# Patient Record
Sex: Male | Born: 1935 | Race: White | Hispanic: No | Marital: Married | Smoking: Former smoker
Health system: Southern US, Community
[De-identification: ages and names within clinical notes are randomized; demographics above are authoritative.]

## PROBLEM LIST (undated history)

## (undated) DIAGNOSIS — K4090 Unilateral inguinal hernia, without obstruction or gangrene, not specified as recurrent: Secondary | ICD-10-CM

## (undated) DIAGNOSIS — R296 Repeated falls: Secondary | ICD-10-CM

## (undated) DIAGNOSIS — Z87898 Personal history of other specified conditions: Secondary | ICD-10-CM

## (undated) DIAGNOSIS — I5189 Other ill-defined heart diseases: Secondary | ICD-10-CM

## (undated) DIAGNOSIS — I340 Nonrheumatic mitral (valve) insufficiency: Secondary | ICD-10-CM

## (undated) DIAGNOSIS — Z96649 Presence of unspecified artificial hip joint: Secondary | ICD-10-CM

## (undated) DIAGNOSIS — I219 Acute myocardial infarction, unspecified: Secondary | ICD-10-CM

## (undated) DIAGNOSIS — Z8679 Personal history of other diseases of the circulatory system: Secondary | ICD-10-CM

## (undated) DIAGNOSIS — I351 Nonrheumatic aortic (valve) insufficiency: Secondary | ICD-10-CM

## (undated) DIAGNOSIS — G20C Parkinsonism, unspecified: Secondary | ICD-10-CM

## (undated) DIAGNOSIS — M1990 Unspecified osteoarthritis, unspecified site: Secondary | ICD-10-CM

## (undated) DIAGNOSIS — G20 Parkinson's disease: Secondary | ICD-10-CM

## (undated) DIAGNOSIS — D693 Immune thrombocytopenic purpura: Secondary | ICD-10-CM

## (undated) DIAGNOSIS — L0291 Cutaneous abscess, unspecified: Secondary | ICD-10-CM

## (undated) DIAGNOSIS — E785 Hyperlipidemia, unspecified: Secondary | ICD-10-CM

## (undated) DIAGNOSIS — I872 Venous insufficiency (chronic) (peripheral): Secondary | ICD-10-CM

## (undated) DIAGNOSIS — I712 Thoracic aortic aneurysm, without rupture: Secondary | ICD-10-CM

## (undated) DIAGNOSIS — G912 (Idiopathic) normal pressure hydrocephalus: Secondary | ICD-10-CM

## (undated) DIAGNOSIS — G20A1 Parkinson's disease without dyskinesia, without mention of fluctuations: Secondary | ICD-10-CM

## (undated) DIAGNOSIS — I4510 Unspecified right bundle-branch block: Secondary | ICD-10-CM

## (undated) DIAGNOSIS — E0590 Thyrotoxicosis, unspecified without thyrotoxic crisis or storm: Secondary | ICD-10-CM

## (undated) DIAGNOSIS — I10 Essential (primary) hypertension: Secondary | ICD-10-CM

## (undated) DIAGNOSIS — I119 Hypertensive heart disease without heart failure: Secondary | ICD-10-CM

## (undated) DIAGNOSIS — I2510 Atherosclerotic heart disease of native coronary artery without angina pectoris: Secondary | ICD-10-CM

## (undated) DIAGNOSIS — Z9079 Acquired absence of other genital organ(s): Secondary | ICD-10-CM

## (undated) HISTORY — DX: Thoracic aortic aneurysm, without rupture: I71.2

## (undated) HISTORY — DX: Parkinson's disease: G20

## (undated) HISTORY — DX: Acquired absence of other genital organ(s): Z90.79

## (undated) HISTORY — DX: Atherosclerotic heart disease of native coronary artery without angina pectoris: I25.10

## (undated) HISTORY — DX: Cutaneous abscess, unspecified: L02.91

## (undated) HISTORY — DX: Unilateral inguinal hernia, without obstruction or gangrene, not specified as recurrent: K40.90

## (undated) HISTORY — DX: Essential (primary) hypertension: I10

## (undated) HISTORY — DX: Hyperlipidemia, unspecified: E78.5

## (undated) HISTORY — DX: Thyrotoxicosis, unspecified without thyrotoxic crisis or storm: E05.90

## (undated) HISTORY — PX: TONSILLECTOMY: SHX5217

## (undated) HISTORY — PX: KNEE SURGERY: SHX244

## (undated) HISTORY — DX: Parkinsonism, unspecified: G20.C

## (undated) HISTORY — PX: HERNIA REPAIR: SHX51

## (undated) HISTORY — DX: Unspecified osteoarthritis, unspecified site: M19.90

## (undated) HISTORY — DX: Presence of unspecified artificial hip joint: Z96.649

## (undated) HISTORY — PX: HIP FUSION: SHX669

## (undated) HISTORY — DX: Unspecified right bundle-branch block: I45.10

## (undated) HISTORY — PX: WRIST SURGERY: SHX841

## (undated) HISTORY — DX: Hypertensive heart disease without heart failure: I11.9

## (undated) HISTORY — DX: Parkinson's disease without dyskinesia, without mention of fluctuations: G20.A1

## (undated) HISTORY — DX: Acute myocardial infarction, unspecified: I21.9

## (undated) HISTORY — PX: CHOLECYSTECTOMY: SHX55

## (undated) HISTORY — PX: PROSTATE SURGERY: SHX751

## (undated) HISTORY — DX: Immune thrombocytopenic purpura: D69.3

---

## 2001-02-19 ENCOUNTER — Ambulatory Visit (HOSPITAL_COMMUNITY): Admission: RE | Admit: 2001-02-19 | Discharge: 2001-02-19 | Payer: Self-pay | Admitting: Gastroenterology

## 2002-08-01 ENCOUNTER — Emergency Department (HOSPITAL_COMMUNITY): Admission: EM | Admit: 2002-08-01 | Discharge: 2002-08-02 | Payer: Self-pay | Admitting: Emergency Medicine

## 2002-08-02 ENCOUNTER — Encounter: Payer: Self-pay | Admitting: Emergency Medicine

## 2005-11-13 ENCOUNTER — Encounter: Payer: Self-pay | Admitting: Orthopedic Surgery

## 2005-12-10 ENCOUNTER — Encounter: Payer: Self-pay | Admitting: Orthopedic Surgery

## 2006-01-10 ENCOUNTER — Encounter: Payer: Self-pay | Admitting: Orthopedic Surgery

## 2006-07-24 ENCOUNTER — Ambulatory Visit (HOSPITAL_COMMUNITY): Admission: RE | Admit: 2006-07-24 | Discharge: 2006-07-25 | Payer: Self-pay | Admitting: Ophthalmology

## 2009-04-26 ENCOUNTER — Emergency Department: Payer: Self-pay | Admitting: Emergency Medicine

## 2010-11-23 ENCOUNTER — Other Ambulatory Visit: Payer: Self-pay | Admitting: Urology

## 2010-11-23 ENCOUNTER — Other Ambulatory Visit (HOSPITAL_COMMUNITY): Payer: Self-pay | Admitting: Urology

## 2010-11-23 ENCOUNTER — Encounter (HOSPITAL_COMMUNITY): Payer: Medicare Other

## 2010-11-23 ENCOUNTER — Ambulatory Visit (HOSPITAL_COMMUNITY)
Admission: RE | Admit: 2010-11-23 | Discharge: 2010-11-23 | Disposition: A | Discharge status: Home / Self Care | Payer: Medicare Other | Source: Ambulatory Visit | Attending: Urology | Admitting: Urology

## 2010-11-23 DIAGNOSIS — Z01811 Encounter for preprocedural respiratory examination: Secondary | ICD-10-CM | POA: Insufficient documentation

## 2010-11-23 DIAGNOSIS — I517 Cardiomegaly: Secondary | ICD-10-CM | POA: Insufficient documentation

## 2010-11-23 DIAGNOSIS — N400 Enlarged prostate without lower urinary tract symptoms: Secondary | ICD-10-CM | POA: Insufficient documentation

## 2010-11-23 DIAGNOSIS — Z01812 Encounter for preprocedural laboratory examination: Secondary | ICD-10-CM | POA: Insufficient documentation

## 2010-11-23 LAB — BASIC METABOLIC PANEL
BUN: 18 mg/dL (ref 6–23)
CO2: 30 mEq/L (ref 19–32)
Calcium: 8.8 mg/dL (ref 8.4–10.5)
Chloride: 99 mEq/L (ref 96–112)
Creatinine, Ser: 0.87 mg/dL (ref 0.4–1.5)
GFR calc Af Amer: >60 mL/min (ref >60)
GFR calc non Af Amer: >60 mL/min (ref >60)
Glucose, Bld: 93 mg/dL (ref 70–99)
Potassium: 4 mEq/L (ref 3.5–5.1)
Sodium: 136 mEq/L (ref 135–145)

## 2010-11-23 LAB — CBC
HCT: 36.4 % — ABNORMAL LOW (ref 39.0–52.0)
Hemoglobin: 12.2 g/dL — ABNORMAL LOW (ref 13.0–17.0)
MCH: 32.5 pg (ref 26.0–34.0)
MCHC: 33.5 g/dL (ref 30.0–36.0)
MCV: 97.1 fL (ref 78.0–100.0)
Platelets: 186 10*3/uL (ref 150–400)
RBC: 3.75 MIL/uL — ABNORMAL LOW (ref 4.22–5.81)
RDW: 13.1 % (ref 11.5–15.5)
WBC: 4.7 10*3/uL (ref 4.0–10.5)

## 2010-11-23 LAB — SURGICAL PCR SCREEN
MRSA, PCR: NEGATIVE
Staphylococcus aureus: POSITIVE — AB

## 2010-12-03 ENCOUNTER — Inpatient Hospital Stay (HOSPITAL_COMMUNITY)
Admission: RE | Admit: 2010-12-03 | Discharge: 2010-12-05 | DRG: 714 | Disposition: A | Discharge status: Home / Self Care | Payer: Medicare Other | Source: Ambulatory Visit | Attending: Urology | Admitting: Urology

## 2010-12-03 ENCOUNTER — Other Ambulatory Visit: Payer: Self-pay | Admitting: Urology

## 2010-12-03 DIAGNOSIS — R351 Nocturia: Secondary | ICD-10-CM | POA: Diagnosis present

## 2010-12-03 DIAGNOSIS — R3915 Urgency of urination: Secondary | ICD-10-CM | POA: Diagnosis present

## 2010-12-03 DIAGNOSIS — R39198 Other difficulties with micturition: Secondary | ICD-10-CM | POA: Diagnosis present

## 2010-12-03 DIAGNOSIS — N401 Enlarged prostate with lower urinary tract symptoms: Principal | ICD-10-CM | POA: Diagnosis present

## 2010-12-03 DIAGNOSIS — I10 Essential (primary) hypertension: Secondary | ICD-10-CM | POA: Diagnosis present

## 2010-12-03 DIAGNOSIS — N138 Other obstructive and reflux uropathy: Principal | ICD-10-CM | POA: Diagnosis present

## 2010-12-03 DIAGNOSIS — E0590 Thyrotoxicosis, unspecified without thyrotoxic crisis or storm: Secondary | ICD-10-CM | POA: Diagnosis present

## 2010-12-03 DIAGNOSIS — I359 Nonrheumatic aortic valve disorder, unspecified: Secondary | ICD-10-CM | POA: Diagnosis present

## 2010-12-03 DIAGNOSIS — Z79899 Other long term (current) drug therapy: Secondary | ICD-10-CM

## 2010-12-03 DIAGNOSIS — I2510 Atherosclerotic heart disease of native coronary artery without angina pectoris: Secondary | ICD-10-CM | POA: Diagnosis present

## 2010-12-03 DIAGNOSIS — R350 Frequency of micturition: Secondary | ICD-10-CM | POA: Diagnosis present

## 2010-12-03 DIAGNOSIS — N3941 Urge incontinence: Secondary | ICD-10-CM | POA: Diagnosis present

## 2010-12-07 NOTE — H&P (Signed)
  NAMETOY, EISEMANN               ACCOUNT NO.:  [id number]  MEDICAL RECORD NO.:  [id number]           PATIENT TYPE:  I  LOCATION:  1416                         FACILITY:  [facility]  PHYSICIAN:  Dore Oquin I. Patsi Sears, M.D.DATE OF BIRTH:  [birth year]  DATE OF ADMISSION:  12/03/2010 DATE OF DISCHARGE:                             HISTORY & PHYSICAL   HISTORY:  Mr. Zuckerman is as an 77 year old male, admitted via the operating room following TURP for BPH.  The patient has a history of resistant urinary frequency, urgency, nocturia, poor stream.  He has failed double dose Flomax in the past.  The patient has been evaluated at [facility], found to have bladder outlet obstruction, with urodynamics showing high pressure, low-flow bladder.  He has been treated in the past for coronary artery disease as well as hyperthyroidism in 2003.  MEDICATIONS:  Have included: 1. Amlodipine. 2. Benazepril. 3. Calcium carbonate. 4. Carvedilol. 5. Cholecalciferol. 6. Finasteride (stopped). 7. Potassium chloride. 8. Pravastatin. 9. Timolol eye drops.  ALLERGIES: 1. LIPITOR. 2. CRESTOR.  REVIEW OF SYSTEMS:  CONSTITUTIONAL:  Significant for urinary symptoms as noted in the HPI.  The patient has fatigue but otherwise has no evidence of nausea, vomiting, diarrhea, constipation or focal weakness signs.  He has no shortness of breath.  PHYSICAL EXAMINATION:  GENERAL:  Shows a well-developed, well-nourished Caucasian male in no acute distress. VITAL SIGNS:  His blood pressure is normal as recorded in the chart. NECK:  Supple, nontender.  No nodes. CHEST:  Clear to P and A. ABDOMEN:  Soft.  Positive bowel sounds without organomegaly, without masses. GENITALIA:  Normal penis and scrotum. RECTAL:  Shows the 3+ lobular benign prostate.  No masses.  No blood. EXTREMITIES:  No cyanosis, no edema.  ASSESSMENT:  The patient is stable status post TURP for BPH.  He is admitted to the hospital for  observation, therapy and IV hydration following TURP.     Case Vassell I. Patsi Sears, M.D.     SIT/MEDQ  D:  12/05/2010  T:  12/05/2010  Job:  [id number]  Electronically Signed by Jethro Bolus M.D. on 12/07/2010 02:25:35 PM

## 2010-12-07 NOTE — Discharge Summary (Signed)
  [name], [name]               ACCOUNT NO.:  [id number]  MEDICAL RECORD NO.:  [id number]           PATIENT TYPE:  I  LOCATION:  1416                         FACILITY:  [facility]  PHYSICIAN:  Makhai Fulco I. Patsi Sears, M.D.DATE OF BIRTH:  22-Nov-1928  DATE OF ADMISSION:  12/03/2010 DATE OF DISCHARGE:                              DISCHARGE SUMMARY   FINAL DIAGNOSIS:  Benign prostatic hypertrophy (pathology pending), most important surgery took place on December 03, 2010, the operation is transurethral resection of the prostate.  HISTORY:  [name] is an 77 year old male, admitted via the operating room following a transurethral resection of the prostate.  He has a history of urinary frequency, urgency, urge incontinence, with nocturia (q.2 hours).  He was wearing paper towels inside his underwear for protection.  The patient has failed double dose Flomax, as well as Vesicare.  Urodynamics has been accomplished, which shows decreased cystometric capacity of 150 mL, uninhibited bladder contractions, normal compliance, and a peak flow rate of only 6 mL per second with 55 cm water pressure, indicating obstruction.  He was admitted via the operating room for TURP.  PAST MEDICAL HISTORY:  Significant for cardiac disease with aortic sclerosis without stenosis, hypertension, hyperthyroidism.  PHYSICAL EXAMINATION:  As noted in the H and P of December 03, 2010.  HOSPITAL COURSE:  The patient was admitted to the hospital via the operating room, and followed for 2 days.  He did quite well, Foley catheter being removed on postop day #2.  He is able to void spontaneously without difficulty.  He is allowed to be discharged on Cipro and tramadol for hip pain.  He will return to the office per prior appointment.  He was discharged in good condition.     Melvie Paglia I. Patsi Sears, M.D.     SIT/MEDQ  D:  12/05/2010  T:  12/05/2010  Job:  [id number]  Electronically Signed by Jethro Bolus M.D. on  12/07/2010 02:25:38 PM

## 2011-01-11 NOTE — Op Note (Signed)
  NAMEHUNG, RHINESMITH               ACCOUNT NO.:  [id number]  MEDICAL RECORD NO.:  [id number]           PATIENT TYPE:  I  LOCATION:  1416                         FACILITY:  [facility]  PHYSICIAN:  Cattie Tineo I. Patsi Sears, M.D.DATE OF BIRTH:  1928-09-26  DATE OF PROCEDURE:  12/03/2010 DATE OF DISCHARGE:  12/05/2010                              OPERATIVE REPORT   PREOPERATIVE DIAGNOSIS:  Benign prostatic hypertrophy.  POSTOPERATIVE DIAGNOSIS:  Benign prostatic hypertrophy.  OPERATION:  Cystourethroscopy, gyrus transurethral resection of prostate.  SURGEON:  Sandia Pfund I. Patsi Sears, M.D.  ANESTHESIA:  General LMA.  OPERATION:  After appropriate preanesthesia, the patient was brought to the operating room, placed on the operating room table in dorsal supine position where general LMA anesthesia was induced.  He was then replaced in the dorsal lithotomy position where the pubis was prepped with Betadine solution and draped in usual fashion.  REVIEW OF HISTORY:  This 77 year old male has history of resistant urinary frequency, urgency, nocturia, and poor stream.  He has failed double dose Flomax and has been evaluated through medical center with urodynamics showing high-pressure low-flow bladder outlet obstruction. He has a past history of coronary artery disease and hyperthyroidism, last treated in 2003; is now for TURP.  PROCEDURE:  Cystourethroscopy was accomplished.  The gyrus instruments were placed in the bladder.  Resection was accomplished from the 7 o'clock to the 5 o'clock position, from the 11 o'clock to the 7 o'clock position, and from the 1 o'clock to the 5 o'clock position.  Chips were evacuated from the bladder with the piston syringe and minimal bleeding was noted.  A hematuria catheter was placed to traction, p.r.n. irrigation.  The patient tolerated the procedure well.  He was taken to recovery room in excellent condition.     Darrelle Wiberg I. Patsi Sears,  M.D.     SIT/MEDQ  D:  12/25/2010  T:  12/25/2010  Job:  [id number]  Electronically Signed by Jethro Bolus M.D. on 01/11/2011 01:23:08 PM

## 2011-02-26 ENCOUNTER — Ambulatory Visit (INDEPENDENT_AMBULATORY_CARE_PROVIDER_SITE_OTHER): Payer: Self-pay | Admitting: General Surgery

## 2011-02-26 ENCOUNTER — Encounter (HOSPITAL_COMMUNITY)
Admission: RE | Admit: 2011-02-26 | Discharge: 2011-02-26 | Disposition: A | Discharge status: Home / Self Care | Payer: Medicare Other | Source: Ambulatory Visit | Attending: Orthopedic Surgery | Admitting: Orthopedic Surgery

## 2011-02-26 LAB — CBC
HCT: 36.1 % — ABNORMAL LOW (ref 39.0–52.0)
Hemoglobin: 12.7 g/dL — ABNORMAL LOW (ref 13.0–17.0)
MCH: 33.4 pg (ref 26.0–34.0)
MCV: 95 fL (ref 78.0–100.0)
RBC: 3.8 MIL/uL — ABNORMAL LOW (ref 4.22–5.81)
WBC: 5.7 10*3/uL (ref 4.0–10.5)

## 2011-02-26 LAB — URINE MICROSCOPIC-ADD ON

## 2011-02-26 LAB — URINALYSIS, ROUTINE W REFLEX MICROSCOPIC
Glucose, UA: NEGATIVE mg/dL
Ketones, ur: NEGATIVE mg/dL
Nitrite: NEGATIVE
Specific Gravity, Urine: 1.023 (ref 1.005–1.030)
pH: 6.5 (ref 5.0–8.0)

## 2011-02-26 LAB — COMPREHENSIVE METABOLIC PANEL
AST: 5 U/L (ref 0–37)
CO2: 35 mEq/L — ABNORMAL HIGH (ref 19–32)
Calcium: 9.1 mg/dL (ref 8.4–10.5)
Creatinine, Ser: 0.81 mg/dL (ref 0.50–1.35)
GFR calc non Af Amer: >60 mL/min (ref >60)
Total Protein: 6 g/dL (ref 6.0–8.3)

## 2011-02-26 LAB — DIFFERENTIAL
Lymphocytes Relative: 15 % (ref 12–46)
Lymphs Abs: 0.9 10*3/uL (ref 0.7–4.0)
Monocytes Relative: 13 % — ABNORMAL HIGH (ref 3–12)
Neutrophils Relative %: 71 % (ref 43–77)

## 2011-02-26 LAB — SURGICAL PCR SCREEN: Staphylococcus aureus: NEGATIVE

## 2011-02-26 LAB — APTT: aPTT: 32 seconds (ref 24–37)

## 2011-02-27 ENCOUNTER — Encounter (INDEPENDENT_AMBULATORY_CARE_PROVIDER_SITE_OTHER): Payer: Self-pay | Admitting: Surgery

## 2011-02-27 LAB — URINE CULTURE: Colony Count: NO GROWTH

## 2011-02-28 ENCOUNTER — Encounter (INDEPENDENT_AMBULATORY_CARE_PROVIDER_SITE_OTHER): Payer: Self-pay | Admitting: Surgery

## 2011-02-28 ENCOUNTER — Ambulatory Visit (INDEPENDENT_AMBULATORY_CARE_PROVIDER_SITE_OTHER): Payer: Medicare Other | Admitting: Surgery

## 2011-02-28 VITALS — BP 120/68 | HR 75 | Temp 98.7°F | Ht 69.0 in | Wt 161.6 lb

## 2011-02-28 DIAGNOSIS — K4090 Unilateral inguinal hernia, without obstruction or gangrene, not specified as recurrent: Secondary | ICD-10-CM | POA: Insufficient documentation

## 2011-02-28 HISTORY — DX: Unilateral inguinal hernia, without obstruction or gangrene, not specified as recurrent: K40.90

## 2011-02-28 NOTE — Patient Instructions (Signed)
Proceed with your hip replacement.  Recommend colonoscopy this year.  Follow-up in 3 months

## 2011-02-28 NOTE — Progress Notes (Signed)
Perry [name]. is a 77 y.o. male.    Chief Complaint  Patient presents with  . Hernia    HPI HPI This patient is referred by Dr. Thurston Hole for evaluation of right inguinal hernia. The patient is scheduled for a left hip replacement by Dr. Thurston Hole on Monday. He has had a lot of chronic pain with his left hip and does take pain medicine. He has a lot of problem with constipation. He occasionally uses MiraLax. He also recently underwent a TURP by Dr. Patsi Sears. He continues to have some urinary incontinence related to that surgery. Recently he noticed a bulge in his right groin. This remains reducible but has become slightly larger when he is standing. It causes no pain. He is referred for evaluation to see whether he needs to have this repaired before his hip surgery. Past Medical History  Diagnosis Date  . Urinary incontinence   . Hypertension   . Hyperlipidemia   . S/P TURP   . Arthritis   . Heart attack     Past Surgical History  Procedure Date  . Hernia repair   . Prostate surgery   . Hip fusion     lt  . Tonsillectomy   . Wrist surgery   . Cholecystectomy   . Knee surgery     rt knee    History reviewed. No pertinent family history.  Social History History  Substance Use Topics  . Smoking status: Never Smoker   . Smokeless tobacco: Never Used  . Alcohol Use: No    Allergies  Allergen Reactions  . Tetanus Toxoids     Current Outpatient Prescriptions  Medication Sig Dispense Refill  . amLODipine (NORVASC) 5 MG tablet Take 5 mg by mouth daily.        . B Complex Vitamins (B-COMPLEX/B-12 PO) Take by mouth.        . benazepril (LOTENSIN) 20 MG tablet Take 20 mg by mouth 2 (two) times daily.        . calcium carbonate (OS-CAL) 600 MG TABS Take 600 mg by mouth 2 (two) times daily with a meal.        . calcium-vitamin D (OSCAL WITH D) 500-200 MG-UNIT per tablet Take 1 tablet by mouth daily.        . carvedilol (COREG) 25 MG tablet Take 25 mg by mouth 1 day or 1  dose.        Marland Kitchen Fesoterodine Fumarate (TOVIAZ PO) Take 500 mg by mouth.        . fish oil-omega-3 fatty acids 1000 MG capsule Take 2 g by mouth daily.        . magnesium 30 MG tablet Take 30 mg by mouth 2 (two) times daily.        . Oxybutynin Chloride (GELNIQUE) 10 % GEL Place onto the skin.        . potassium chloride 20 MEQ/15ML (10%) solution Take by mouth daily.        . traMADol (ULTRAM-ER) 100 MG 24 hr tablet Take 100 mg by mouth as needed.          Review of Systems ROS Positive for recent weight loss related to his multiple surgeries, hypertension, hypercholesterolemia, and history of skin cancer, enlarged prostate, mild anemia, severe arthritis. Physical Exam Physical Exam   Blood pressure 120/68, pulse 75, temperature 98.7 F (37.1 C), temperature source Temporal, height 5\' 9"  (1.753 m), weight 161 lb 9.6 oz (73.301 kg). WDWN in NAD HEENT:  EOMI, sclera anicteric Neck:  No masses, no thyromegaly Lungs:  CTA bilaterally; normal respiratory effort CV:  Regular rate and rhythm; no murmurs Abd:  +bowel sounds, soft, non-tender, no masses GU: Large right inguinal bulge which is easily reducible. No sign of left inguinal hernia. Bilateral descended testes with no testicular masses. Ext:  Well-perfused; no edema Skin:  Warm, dry; no sign of jaundice  Assessment/Plan Impression: Large right inguinal hernia, easily reducible, no significant symptoms. Recommendations: The patient may proceed with his hip replacement as scheduled. I spoke with Dr. Sherene Sires PA. Once he has finished physical therapy and is fully recovered that I would like to see him back to discuss hernia repair. His risk of incarceration is fairly low given the large size of his hernia. I showed the patient had a reduces on hernia. He is also scheduled for colonoscopy this year and I recommended that he proceed with the colonoscopy once he has recovered from his hip replacement.  We will recheck him in 3  months.Byan Poplaski K. 02/28/2011, 1:23 PM

## 2011-03-04 ENCOUNTER — Inpatient Hospital Stay (HOSPITAL_COMMUNITY): Payer: Medicare Other

## 2011-03-04 ENCOUNTER — Inpatient Hospital Stay (HOSPITAL_COMMUNITY)
Admission: RE | Admit: 2011-03-04 | Discharge: 2011-03-07 | DRG: 470 | Disposition: A | Discharge status: Skilled Nursing Facility | Payer: Medicare Other | Source: Ambulatory Visit | Attending: Orthopedic Surgery | Admitting: Orthopedic Surgery

## 2011-03-04 DIAGNOSIS — M169 Osteoarthritis of hip, unspecified: Principal | ICD-10-CM | POA: Diagnosis present

## 2011-03-04 DIAGNOSIS — Z01812 Encounter for preprocedural laboratory examination: Secondary | ICD-10-CM

## 2011-03-04 DIAGNOSIS — I10 Essential (primary) hypertension: Secondary | ICD-10-CM | POA: Diagnosis present

## 2011-03-04 DIAGNOSIS — D62 Acute posthemorrhagic anemia: Secondary | ICD-10-CM | POA: Diagnosis not present

## 2011-03-04 DIAGNOSIS — K5900 Constipation, unspecified: Secondary | ICD-10-CM | POA: Diagnosis not present

## 2011-03-04 DIAGNOSIS — N318 Other neuromuscular dysfunction of bladder: Secondary | ICD-10-CM | POA: Diagnosis present

## 2011-03-04 DIAGNOSIS — M1610 Unilateral primary osteoarthritis, unspecified hip: Principal | ICD-10-CM | POA: Diagnosis present

## 2011-03-04 DIAGNOSIS — R339 Retention of urine, unspecified: Secondary | ICD-10-CM | POA: Diagnosis not present

## 2011-03-04 DIAGNOSIS — I252 Old myocardial infarction: Secondary | ICD-10-CM

## 2011-03-04 LAB — TYPE AND SCREEN: Antibody Screen: NEGATIVE

## 2011-03-04 LAB — HEPATIC FUNCTION PANEL
Albumin: 2.9 g/dL — ABNORMAL LOW (ref 3.5–5.2)
Total Protein: 5.2 g/dL — ABNORMAL LOW (ref 6.0–8.3)

## 2011-03-05 LAB — BASIC METABOLIC PANEL
BUN: 17 mg/dL (ref 6–23)
CO2: 28 mEq/L (ref 19–32)
Chloride: 101 mEq/L (ref 96–112)
Creatinine, Ser: 0.83 mg/dL (ref 0.50–1.35)
Glucose, Bld: 168 mg/dL — ABNORMAL HIGH (ref 70–99)

## 2011-03-05 LAB — CBC
HCT: 28.9 % — ABNORMAL LOW (ref 39.0–52.0)
MCV: 95.1 fL (ref 78.0–100.0)
RBC: 3.04 MIL/uL — ABNORMAL LOW (ref 4.22–5.81)
WBC: 13.5 10*3/uL — ABNORMAL HIGH (ref 4.0–10.5)

## 2011-03-06 LAB — BASIC METABOLIC PANEL
BUN: 11 mg/dL (ref 6–23)
Chloride: 104 mEq/L (ref 96–112)
Creatinine, Ser: 0.62 mg/dL (ref 0.50–1.35)
GFR calc Af Amer: >60 mL/min (ref >60)
GFR calc non Af Amer: >60 mL/min (ref >60)

## 2011-03-06 LAB — CBC
HCT: 28.9 % — ABNORMAL LOW (ref 39.0–52.0)
MCHC: 34.6 g/dL (ref 30.0–36.0)
MCV: 95.4 fL (ref 78.0–100.0)
RDW: 13.5 % (ref 11.5–15.5)

## 2011-03-07 LAB — CBC
HCT: 26.9 % — ABNORMAL LOW (ref 39.0–52.0)
MCHC: 35.3 g/dL (ref 30.0–36.0)
RDW: 13.6 % (ref 11.5–15.5)

## 2011-03-07 LAB — BASIC METABOLIC PANEL
BUN: 12 mg/dL (ref 6–23)
Calcium: 8.2 mg/dL — ABNORMAL LOW (ref 8.4–10.5)
GFR calc Af Amer: >60 mL/min (ref >60)
GFR calc non Af Amer: >60 mL/min (ref >60)
Potassium: 4.1 mEq/L (ref 3.5–5.1)

## 2011-03-11 DIAGNOSIS — E039 Hypothyroidism, unspecified: Secondary | ICD-10-CM

## 2011-03-11 DIAGNOSIS — F411 Generalized anxiety disorder: Secondary | ICD-10-CM

## 2011-03-11 DIAGNOSIS — IMO0002 Reserved for concepts with insufficient information to code with codable children: Secondary | ICD-10-CM

## 2011-03-11 DIAGNOSIS — I10 Essential (primary) hypertension: Secondary | ICD-10-CM

## 2011-03-11 DIAGNOSIS — N400 Enlarged prostate without lower urinary tract symptoms: Secondary | ICD-10-CM

## 2011-03-11 DIAGNOSIS — Z96649 Presence of unspecified artificial hip joint: Secondary | ICD-10-CM

## 2011-03-13 ENCOUNTER — Ambulatory Visit: Payer: Medicare Other | Admitting: Family Medicine

## 2011-03-15 DIAGNOSIS — I10 Essential (primary) hypertension: Secondary | ICD-10-CM

## 2011-03-24 NOTE — Op Note (Signed)
NAMEMERVIN, RAMIRES NO.:  [id number]  MEDICAL RECORD NO.:  [id number]  LOCATION:  2899                         FACILITY:  [facility]  PHYSICIAN:  Molly Maduro A. Thurston Hole, M.D. DATE OF BIRTH:  16-May-1929  DATE OF PROCEDURE:  03/04/2011 DATE OF DISCHARGE:                              OPERATIVE REPORT   PREOPERATIVE DIAGNOSIS:  Left hip degenerative joint disease.  POSTOPERATIVE DIAGNOSIS:  Left hip degenerative joint disease.  PROCEDURE:  Left total hip replacement using DePuy Press-Fit Summit total hip system with acetabulum 58-mm Press-Fit Pinnacle acetabulum with 2 locking screws and 10-degree polyethylene liner, femoral component #5 Summit Press-Fit femoral stem with +1.5 x 36 mm hip ball.  DESCRIPTION:  Mr. Hartman was brought in the operating room on March 04, 2011, and placed on the operative table in supine position.  After being placed under general anesthesia,  he received antibiotics Ancef 2 g IV preoperatively for prophylaxis.  He had a Foley catheter placed under sterile conditions.  His left hip was examined under general anesthesia with flexion to 90, extension to 0, internal and external rotation 10 degrees with leg lengths approximately equal.  He was then placed in left lateral decubitus position and secured on the bed with a Stulberg frame.  His left hip and leg was then prepped using sterile DuraPrep and draped using sterile technique.  Originally, through a 15-cm posterolateral greater trochanteric incision, initial exposure was made. Underlying subcutaneous tissues were incised along with skin incision. Iliotibial band and gluteus maximus fascia was incised longitudinally revealing the underlying sciatic nerve which was carefully protected. Short external rotators of the hip and hip capsule were released off the femoral neck insertions intact and then the hip was posteriorly dislocated.  He was found to have grade 4 DJD in the femoral head.   A femoral neck cut was then made 1.5 to 2 cm above the lesser trochanter in the appropriate manner of anteversion and inclination.  Carefully placed retractors were placed around the acetabulum.  Grade 3 and 4 DJD noted there as well.  Degenerative acetabular labrum was resected. Sequential acetabular reaming was then carried out up to a 57-mm size when the appropriate manner of anteversion, abduction, inclination, then a 58-mm trial was placed with an excellent fit.  It was then removed and the actual 58-mm Pinnacle cup was hammered into position in the appropriate manner of anteversion, abduction, inclination with an excellent fit.  Two locking screws were placed 20 mm each one in 12 o'clock and one in 1 o'clock position with firm and tight fixation.  A 10-degree polyethylene liner was then placed with the 10-degree lip in the posterolateral position.  After this was done, the proximal femur was then exposed.  Sequential femoral canal reaming was carried up to a #5 size and with the broach to a #5 size and with a #5 broach in place and a 1.5 x 36 mm hip ball trial, the hip was reduced, taken through a full range of motion, found to be stable up to 80 degrees of internal rotation in both neutral and 30 degrees of adduction and also stable in abduction, external rotation, and leg lengths were equal.  At this point, it was felt that the trial was of excellent size and fit.  The femoral trial was then removed.  Femoral canal was irrigated, then the actual #5 Summit stem was hammered into position with an excellent fit and a 1.5 x 36 mm hip ball was placed on the femoral neck and tapped in position with an excellent Morse taper fit.  The hip was then again reduced again taken through a full range of motion and found to be stable.  Leg lengths were equal.  At this point, it was felt that all components were of excellent size, fit and stability.  The wound was further irrigated, then the short  external rotators, the hip and hip capsule were then reattached through their femoral neck insertion through two drill holes in the greater trochanter.  The iliotibial band and gluteus maximus fascia was closed with #1 Ethibond suture. Subcutaneous tissues closed with 0 and 2-0 Vicryl, subcuticular layer closed with 4-0 Monocryl.  Sterile dressings, long-leg, and an abduction splint was applied.  The patient turned supine, checked for leg lengths that were equal, rotation, equal pulses 2+ and symmetric.  He was then awakened, extubated, and taken to recovery room in stable condition.  Needle, sponge counts were correct x2 at the end of the case.     Dorothea Yow A. Thurston Hole, M.D.     RAW/MEDQ  D:  03/04/2011  T:  03/04/2011  Job:  [id number]  Electronically Signed by Salvatore Marvel M.D. on 03/24/2011 02:58:40 PM

## 2011-03-25 DIAGNOSIS — M546 Pain in thoracic spine: Secondary | ICD-10-CM

## 2011-04-12 NOTE — Consult Note (Signed)
NAMERICKE, KIMOTO NO.:  [id number]  MEDICAL RECORD NO.:  [id number]  LOCATION:  5037                         FACILITY:  [facility]  PHYSICIAN:  Sigmund I. Patsi Sears, M.D.DATE OF BIRTH:  04/17/1929  DATE OF CONSULTATION:  03/06/2011 DATE OF DISCHARGE:                                CONSULTATION   SUBJECTIVE:  Mr. Code is an 77 year old male, status post TURP for BPH on December 05, 2010.  The patient has a history of urinary frequency, urgency, urge incontinence, with nocturia q.2 hours.  He was wearing paper towels inside his underwear for protection.  He has failed double dose Flomax in the past, as well as VESIcare.  Urodynamics was accomplished, and showed a decreased cystometric capacity of 150 mL bladder, uninhibited bladder contractions, normal compliance, and a peak flow rate of only 6 mL per second, with 55 cm water pressure, indicating high-pressure, low-flow state.  Following TURP, the patient stabilized, was seen in the office for followup, complaining of significant urgency, frequency, and urge incontinence.  He was placed on Toviaz, but continued to have additional problems, and was then placed on Gelnique as well.  He is admitted via the operating room for left total hip arthroplasty, and developed postop urinary retention.  The Toviaz and Gelnique have been discontinued, the patient was placed on Flomax 0.4 mg and Urecholine 25 mg b.i.d..  He has been able to void today, but I did not see PVR on chart.  The patient believes he is voiding adequately, however.  PAST MEDICAL HISTORY: 1. Anxiety. 2. Arthritis. 3. Hypertension.  PAST SURGICAL HISTORY: 1. Cholecystectomy. 2. Hemorrhoidectomy. 3. Right knee replacement. 4. Right hip replacement. 5. TURP.  Medications at home include: 1. Amlodipine. 2. Aspirin. 3. Benazepril. 4. Bilberry. 5. Calcium. 6. Carvedilol. 7. Coenzyme Q. 8. Grape seed extract. 9. Hyaluronic  acid. 10.Hydrocortisone cream. 11.Ibuprofen. 12.Imiquad cream. 13.Lidocaine cream. 14.Magnesium. 15.Melatonin. 16.Nitroglycerin p.r.n. 17.Omega 3. 18.Vitamins. 19.Potassium chloride 20 mEq. 20.Pravastatin. 21.Skelaxin. 22.Timolol. 23.Tramadol. 24.Valerian root. 25.Vitamin D3.  ALLERGIES:  TETANUS TOXOID.  FAMILY HISTORY:  Significant for 2 children alive and well.  The patient lives with his wife.  SOCIAL HISTORY:  The patient is nonsmoker.  He is retired.  There is no alcohol, but there is caffeine use.  PHYSICAL EXAMINATION:  VITAL SIGNS:  Blood pressure 130/72, temperature 98.2, heart rate 60. ABDOMEN:  Soft, plus bowel sounds without organomegaly or masses. GENITOURINARY:  The bladder is not palpable. RECTAL:  Not indicated this examination.  IMPRESSION:  Patient with postop urinary incontinence because he has a very small capacity bladder with severe sensory urgency.  Because of his urinary retention, medications were stopped, and he was placed on Flomax, which may work better now that he has had transurethral prostatectomy, and also Urecholine, with precautions for cardiac side effects.  The patient is to be discharged tomorrow to [facility], and should stay on his Flomax 0.4 mg per day.  I would like to see the patient in the office when he recovers from his rehab.     Sigmund I. Patsi Sears, M.D.     SIT/MEDQ  D:  03/06/2011  T:  03/07/2011  Job:  [id number]  Electronically  Signed by Jethro Bolus M.D. on 04/12/2011 03:06:00 PM

## 2011-04-16 NOTE — Discharge Summary (Signed)
**Perry [name]** [name], [name] NO.:  [id number]  MEDICAL RECORD NO.:  [id number]  LOCATION:  5037                         FACILITY:  [facility]  PHYSICIAN:  Isael Stille A. Thurston Hole, M.D. DATE OF BIRTH:  03/20/1929  DATE OF ADMISSION:  03/04/2011 DATE OF DISCHARGE:                        DISCHARGE SUMMARY - REFERRING   ADMITTING DIAGNOSES:  End-stage degenerative joint disease left hip, anxiety, overactive bladder and hypertension.  DISCHARGE DIAGNOSES:  End-stage degenerative joint disease left hip, acute blood loss anemia, hypertension, urinary retention, constipation and overactive bladder.  HISTORY OF PRESENT ILLNESS:  The patient is an 77 year old white male with a history of end-stage DJD of his left hip.  He is status post right total hip replacement by conservative care including anti- inflammatories and interarticular cortisone injections on his left hip, now is ready to have a left total hip replacement.  Risks, benefits and possible complications were discussed in detail with the patient and his wife.  PROCEDURES IN-HOUSE:  On March 04, 2011, the patient underwent a left total hip replacement by Dr. Thurston Hole.  HOSPITAL COURSE:  The patient was admitted postoperatively for pain control, DVT prophylaxis and physical therapy.  Postop day 1, the patient walked in the hall and was very motivated, although somewhat somnolent.  His Foley was removed.  He had difficulty with frequent urination and had postvoid residuals of greater than 300.  Urology was called.  Dr. Jethro Bolus as his urologist and it was recommended that he discontinue his Gelnique and discontinue his Gala Murdoch as they are bladder relaxers and could lead to his urinary retention.  He was given 24 hours of Urecholine and started on Flomax 0.4 mg 1 tablet once a day for this.  His postvoid residuals were down to less than 200 at this point in time.  He was very somnolent with narcotics  including hydrocodone or oxycodone, so all narcotics were discontinued on postop day 2.  He is given Tylenol 325 mg 2 tablets every 4-6 hours as needed for pain, Lovenox is being given for DVT prophylaxis.  At this point in time, his amlodipine and his benazepril are on hold if his blood pressure is less than 120/80.  He has been getting his carvedilol daily. He has not had any trouble with any chest pain.  He received a mag citrate on March 06, 2011, and had a bowel movement with the mag citrate. His hemoglobin is stable at 9.5.  He is afebrile.  He is alert and oriented today much better than yesterday.  DISCHARGE MEDICATIONS: 1. Tylenol 325 mg 2 tablets every 4 h. as needed for pain. 2. Lovenox 40 mg subcu 1 injection daily for the next 33 days for DVT     prophylaxis. 3. Flomax 0.4 mg 1 tablet daily scheduled, this is for urinary     retention. 4. Amlodipine 5 mg 1 tablet daily.  Please hold if blood pressure less     than 120/80. 5. Benazepril 20 mg 1 tablet twice a day.  Please hold if blood     pressure is less than 120/80. 6. Carvedilol 12.5 mg 1 tablet twice a day. 7. Do not hold metaxalone 800 mg 1 tablet every  8 h. as needed for     muscle spasm. 8. MiraLax 17 g total daily divides into 3 doses being 1 teaspoon 3     times a day with 8 ounces of water. 9. Nitroglycerin sublingual 0.4 mg 1 tablet every 5 minutes as needed     up to 3 doses for chest pain. 10.Potassium 20 mEq 1 tablet daily. 11.Timolol ophthalmic solution 0.5 mg 1 tablet in his left eye twice a     day.  He is 50% weightbearing.  He has posterior total hip precautions.  He needs to see Dr. Thurston Hole in the office on March 18, 2011.  Please call [phone] to schedule this appointment.  He needs to have both heels floated at all times when in bed to prevent heel sores.  He needs physical therapy daily, occupational therapy daily.  Please call our office with increased pain, increased swelling, increased  redness, increased drainage or a temp greater than 101.  Right now, his surgical wound is well-approximated and healing with no redness and minimal drainage.  He is to begin showering on March 11, 2011.  Please wash his wound daily with soap and water, rinsing well, patting dry and dressing with a dry dressing.     Kirstin Shepperson, PA-C   ______________________________ Elana Alm Thurston Hole, M.D.    KS/MEDQ  D:  03/07/2011  T:  03/07/2011  Job:  [id number]  Electronically Signed by Julien Girt P.A. on 04/03/2011 01:29:50 PM Electronically Signed by Salvatore Marvel M.D. on 04/16/2011 56:21:30 AM

## 2011-04-23 ENCOUNTER — Other Ambulatory Visit: Payer: Self-pay | Admitting: Orthopedic Surgery

## 2011-04-23 ENCOUNTER — Encounter (INDEPENDENT_AMBULATORY_CARE_PROVIDER_SITE_OTHER): Payer: Medicare Other | Admitting: *Deleted

## 2011-04-23 DIAGNOSIS — M7989 Other specified soft tissue disorders: Secondary | ICD-10-CM

## 2011-04-23 DIAGNOSIS — M79609 Pain in unspecified limb: Secondary | ICD-10-CM

## 2011-05-09 DIAGNOSIS — F3341 Major depressive disorder, recurrent, in partial remission: Secondary | ICD-10-CM | POA: Insufficient documentation

## 2011-05-09 DIAGNOSIS — IMO0001 Reserved for inherently not codable concepts without codable children: Secondary | ICD-10-CM | POA: Insufficient documentation

## 2011-05-09 DIAGNOSIS — Z96649 Presence of unspecified artificial hip joint: Secondary | ICD-10-CM | POA: Insufficient documentation

## 2011-05-09 DIAGNOSIS — I712 Thoracic aortic aneurysm, without rupture, unspecified: Secondary | ICD-10-CM | POA: Insufficient documentation

## 2011-05-09 DIAGNOSIS — E0590 Thyrotoxicosis, unspecified without thyrotoxic crisis or storm: Secondary | ICD-10-CM

## 2011-05-09 DIAGNOSIS — Z9849 Cataract extraction status, unspecified eye: Secondary | ICD-10-CM | POA: Insufficient documentation

## 2011-05-09 HISTORY — DX: Thoracic aortic aneurysm, without rupture, unspecified: I71.20

## 2011-05-09 HISTORY — DX: Thyrotoxicosis, unspecified without thyrotoxic crisis or storm: E05.90

## 2011-05-09 HISTORY — DX: Thoracic aortic aneurysm, without rupture: I71.2

## 2011-05-09 HISTORY — DX: Presence of unspecified artificial hip joint: Z96.649

## 2011-05-28 ENCOUNTER — Encounter (INDEPENDENT_AMBULATORY_CARE_PROVIDER_SITE_OTHER): Payer: Self-pay | Admitting: Surgery

## 2012-07-03 ENCOUNTER — Telehealth: Payer: Self-pay | Admitting: Cardiology

## 2012-07-03 NOTE — Telephone Encounter (Signed)
Ok to see patient per  Dr. Patty Sermons

## 2012-07-03 NOTE — Telephone Encounter (Signed)
Pt was here with wife and said dr Patty Sermons would take him on as a new patient, however I dont see anything with an ok to do this, pt not ready to schedule but would like an ok documented in chart, so when they call back they can schedule appt

## 2012-07-27 DIAGNOSIS — Z86006 Personal history of melanoma in-situ: Secondary | ICD-10-CM | POA: Insufficient documentation

## 2012-08-25 ENCOUNTER — Ambulatory Visit (INDEPENDENT_AMBULATORY_CARE_PROVIDER_SITE_OTHER): Payer: Medicare Other | Admitting: Cardiology

## 2012-08-25 ENCOUNTER — Encounter: Payer: Self-pay | Admitting: Cardiology

## 2012-08-25 VITALS — BP 126/70 | HR 54 | Resp 18 | Ht 66.0 in | Wt 188.2 lb

## 2012-08-25 DIAGNOSIS — R609 Edema, unspecified: Secondary | ICD-10-CM

## 2012-08-25 DIAGNOSIS — R0609 Other forms of dyspnea: Secondary | ICD-10-CM | POA: Insufficient documentation

## 2012-08-25 DIAGNOSIS — R011 Cardiac murmur, unspecified: Secondary | ICD-10-CM

## 2012-08-25 DIAGNOSIS — R600 Localized edema: Secondary | ICD-10-CM

## 2012-08-25 NOTE — Patient Instructions (Addendum)
Your physician recommends that you continue on your current medications as directed. Please refer to the Current Medication list given to you today.  Your physician has requested that you have an echocardiogram. Echocardiography is a painless test that uses sound waves to create images of your heart. It provides your doctor with information about the size and shape of your heart and how well your heart's chambers and valves are working. This procedure takes approximately one hour. There are no restrictions for this procedure. END OF NEXT WEEK  Your physician wants you to follow-up in: 4 month ov/ekg You will receive a reminder letter in the mail two months in advance. If you don't receive a letter, please call our office to schedule the follow-up appointment.

## 2012-08-25 NOTE — Progress Notes (Signed)
Perry [name] Date of Birth:  [birth year] [facility] [address] [place], [place]  [address] [phone]         Fax   [phone]  History of Present Illness: This 79 year old gentleman is seen for the first time today.  His wife is a patient in this clinic.  The patient has a history of exertional dyspnea.  He does not have any history of known ischemic heart disease.  He states that he has not had a myocardial infarction.  He does have a past history of high blood pressure.  He does not recall ever being told that he has a low ejection fraction.  He states that approximately 5 years ago he was worked up at [facility] and had an MRI of his heart looking for a possible scar but he does not recall the results.  The patient denies any exertional chest pain.  He denies any dizziness or syncope.  He has not been aware of any palpitations. His past history is positive for overactive bladder for which he sees Dr. Cassell Smiles.  He has had a trial of PTNS weekly for more than a year and now is on a every 3 weeks schedule.  He has nocturia from once a night up to 6 times a night.  He has a past history of skin cancer which he is followed at [facility].  Current Outpatient Prescriptions  Medication Sig Dispense Refill  . amLODipine (NORVASC) 5 MG tablet Take 5 mg by mouth daily.        . B Complex Vitamins (B-COMPLEX/B-12 PO) Take by mouth.        . benazepril (LOTENSIN) 20 MG tablet Take 20 mg by mouth 2 (two) times daily.        . calcium carbonate (OS-CAL) 600 MG TABS Take 600 mg by mouth 2 (two) times daily with a meal.        . calcium-vitamin D (OSCAL WITH D) 500-200 MG-UNIT per tablet Take 1 tablet by mouth daily.        . carvedilol (COREG) 12.5 MG tablet Take 12.5 mg by mouth 2 (two) times daily with a meal.      . fish oil-omega-3 fatty acids 1000 MG capsule Take 2 g by mouth daily.        . magnesium 30 MG tablet Take 30 mg by mouth 2 (two) times daily.        . mirabegron ER  (MYRBETRIQ) 50 MG TB24 Take 50 mg by mouth 2 (two) times daily.      . potassium chloride SA (K-DUR,KLOR-CON) 20 MEQ tablet Take 20 mEq by mouth 2 (two) times daily.      . Oxybutynin Chloride (GELNIQUE) 10 % GEL Place onto the skin.          Allergies  Allergen Reactions  . Tetanus Toxoids     Patient Active Problem List  Diagnosis  . Right inguinal hernia  . Dyspnea on exertion  . Pedal edema  . Heart murmur    History  Smoking status  . Never Smoker   Smokeless tobacco  . Never Used    History  Alcohol Use No    No family history on file.  Review of Systems: Constitutional: no fever chills diaphoresis or fatigue or change in weight.  Head and neck: no hearing loss, no epistaxis, no photophobia or visual disturbance. Respiratory: No cough, shortness of breath or wheezing. Cardiovascular: No chest pain peripheral edema, palpitations. Gastrointestinal: No  abdominal distention, no abdominal pain, no change in bowel habits hematochezia or melena. Genitourinary: No dysuria, no frequency, no urgency, no nocturia. Musculoskeletal:No arthralgias, no back pain, no gait disturbance or myalgias. Neurological: No dizziness, no headaches, no numbness, no seizures, no syncope, no weakness, no tremors. Hematologic: No lymphadenopathy, no easy bruising. Psychiatric: No confusion, no hallucinations, no sleep disturbance.    Physical Exam: Filed Vitals:   08/25/12 1150  BP: 126/70  Pulse: 54  Resp: 18   the general appearance reveals a well-developed elderly gentleman in no distress.The head and neck exam reveals pupils equal and reactive.  Extraocular movements are full.  There is no scleral icterus.  The mouth and pharynx are normal.  The neck is supple.  The carotids reveal no bruits.  The jugular venous pressure is normal.  The  thyroid is not enlarged.  There is no lymphadenopathy.  The chest is clear to percussion and auscultation.  There are no rales or rhonchi.  Expansion  of the chest is symmetrical.  The precordium is quiet.  The first heart sound is normal.  The second heart sound is physiologically split.  There is no  gallop rub or click.  There is a soft systolic ejection murmur at the base.  No diastolic murmur.  There is no abnormal lift or heave.  The abdomen is soft and nontender.  The bowel sounds are normal.  The liver and spleen are not enlarged.  There are no abdominal masses.  There are no abdominal bruits.  Extremities reveal 1+ pedal pulses.  There is 1-2+ pretibial and ankle edema bilaterally.  There is no cyanosis or clubbing.  Strength is normal and symmetrical in all extremities.  There is no lateralizing weakness.  There are no sensory deficits.  The skin is warm and dry.  There is no rash.  EKG today shows sinus bradycardia at 50 per minute, otherwise normal EKG   Assessment / Plan: 1. hypertensive cardiovascular disease, controlled on current medication 2. systolic heart murmur at the base 3. history of overactive bladder followed by Dr. Patsi Sears  Plan: Continue same medication.  Return next week for a two-dimensional echocardiogram to evaluate LV function and basilar heart murmur.  Recheck in 4 months for followup office visit and EKG

## 2012-09-03 ENCOUNTER — Ambulatory Visit (HOSPITAL_COMMUNITY): Payer: Medicare Other | Attending: Cardiology

## 2012-09-03 DIAGNOSIS — I369 Nonrheumatic tricuspid valve disorder, unspecified: Secondary | ICD-10-CM | POA: Insufficient documentation

## 2012-09-03 DIAGNOSIS — I379 Nonrheumatic pulmonary valve disorder, unspecified: Secondary | ICD-10-CM | POA: Insufficient documentation

## 2012-09-03 DIAGNOSIS — R011 Cardiac murmur, unspecified: Secondary | ICD-10-CM | POA: Insufficient documentation

## 2012-09-03 DIAGNOSIS — I080 Rheumatic disorders of both mitral and aortic valves: Secondary | ICD-10-CM | POA: Insufficient documentation

## 2012-09-03 DIAGNOSIS — I10 Essential (primary) hypertension: Secondary | ICD-10-CM | POA: Insufficient documentation

## 2012-09-03 NOTE — Progress Notes (Signed)
Echocardiogram performed.  

## 2012-09-10 ENCOUNTER — Telehealth: Payer: Self-pay | Admitting: Cardiology

## 2012-09-10 NOTE — Telephone Encounter (Signed)
Advised patient of echo  

## 2012-09-10 NOTE — Telephone Encounter (Signed)
Message copied by Burnell Blanks on Thu Sep 10, 2012  4:40 PM ------      Message from: Cassell Clement      Created: Thu Sep 03, 2012  8:56 PM       Please report.  The LV systolic function is normal. EF 55-65% There is diastolic dysfunction (impaired relaxation). There is mild MR and AI.  CSD

## 2012-09-10 NOTE — Telephone Encounter (Signed)
New problem    Returning call back to nurse.   

## 2012-12-23 ENCOUNTER — Ambulatory Visit (INDEPENDENT_AMBULATORY_CARE_PROVIDER_SITE_OTHER): Payer: Medicare Other | Admitting: Cardiology

## 2012-12-23 ENCOUNTER — Encounter: Payer: Self-pay | Admitting: Cardiology

## 2012-12-23 VITALS — BP 110/68 | HR 56 | Ht 66.0 in | Wt 191.4 lb

## 2012-12-23 DIAGNOSIS — I119 Hypertensive heart disease without heart failure: Secondary | ICD-10-CM

## 2012-12-23 DIAGNOSIS — R600 Localized edema: Secondary | ICD-10-CM

## 2012-12-23 DIAGNOSIS — R011 Cardiac murmur, unspecified: Secondary | ICD-10-CM

## 2012-12-23 DIAGNOSIS — R0609 Other forms of dyspnea: Secondary | ICD-10-CM

## 2012-12-23 DIAGNOSIS — R609 Edema, unspecified: Secondary | ICD-10-CM

## 2012-12-23 HISTORY — DX: Hypertensive heart disease without heart failure: I11.9

## 2012-12-23 NOTE — Progress Notes (Signed)
Perry [name] Date of Birth:  10/31/1928 [facility] [address] [place], [place]  [address] [phone]         Fax   [phone]  History of Present Illness: This 79 year old gentleman is seen for a four-month followup office visit. His wife is a patient in this clinic. The patient has a history of exertional dyspnea. He does not have any history of known ischemic heart disease. He states that he has not had a myocardial infarction. He does have a past history of high blood pressure. He does not recall ever being told that he has a low ejection fraction. He states that approximately 5 years ago he was worked up at [facility] and had an MRI of his heart looking for a possible scar but he does not recall the results. The patient denies any exertional chest pain. He denies any dizziness or syncope. He has not been aware of any palpitations.  He had an echocardiogram on 09/03/12 which showed an ejection fraction of 55-65% and grade 1 diastolic dysfunction.  There was mild aortic insufficiency and mild mitral regurgitation. His past history is positive for overactive bladder for which he sees Dr. Cassell Smiles. He has had a trial of PTNS weekly for more than a year and now is on a every 3 weeks schedule. He has nocturia from once a night up to 6 times a night. He has a past history of skin cancer which he is followed at [facility].   Current Outpatient Prescriptions  Medication Sig Dispense Refill  . amLODipine (NORVASC) 5 MG tablet Take 5 mg by mouth daily.        . B Complex Vitamins (B-COMPLEX/B-12 PO) Take by mouth.        . benazepril (LOTENSIN) 20 MG tablet Take 20 mg by mouth 2 (two) times daily.        . carvedilol (COREG) 12.5 MG tablet Take 12.5 mg by mouth 2 (two) times daily with a meal.      . Cholecalciferol (VITAMIN D) 2000 UNITS CAPS Take by mouth daily.      . fish oil-omega-3 fatty acids 1000 MG capsule Take 2 g by mouth daily.        . magnesium 30 MG tablet Take 30 mg  by mouth 2 (two) times daily.        . mirabegron ER (MYRBETRIQ) 50 MG TB24 Take 50 mg by mouth 2 (two) times daily.      Marland Kitchen OVER THE COUNTER MEDICATION 2 (two) times daily. Juice plus capsules      . potassium chloride SA (K-DUR,KLOR-CON) 20 MEQ tablet Take 20 mEq by mouth 2 (two) times daily.      . psyllium (METAMUCIL) 58.6 % powder Take 1 packet by mouth daily.      . timolol (TIMOPTIC) 0.5 % ophthalmic solution 1 drop 2 (two) times daily.      . calcium carbonate (OS-CAL) 600 MG TABS Take 600 mg by mouth 2 (two) times daily with a meal.        . calcium-vitamin D (OSCAL WITH D) 500-200 MG-UNIT per tablet Take 1 tablet by mouth daily.        . Oxybutynin Chloride (GELNIQUE) 10 % GEL Place onto the skin.         No current facility-administered medications for this visit.    Allergies  Allergen Reactions  . Tetanus Toxoids     Patient Active Problem List   Diagnosis Date Noted  .  Dyspnea on exertion 08/25/2012  . Pedal edema 08/25/2012  . Heart murmur 08/25/2012  . Right inguinal hernia 02/28/2011    History  Smoking status  . Never Smoker   Smokeless tobacco  . Never Used    History  Alcohol Use No    No family history on file.  Review of Systems: Constitutional: no fever chills diaphoresis or fatigue or change in weight.  Head and neck: no hearing loss, no epistaxis, no photophobia or visual disturbance. Respiratory: No cough, shortness of breath or wheezing. Cardiovascular: No chest pain peripheral edema, palpitations. Gastrointestinal: No abdominal distention, no abdominal pain, no change in bowel habits hematochezia or melena. Genitourinary: No dysuria, no frequency, no urgency, no nocturia. Musculoskeletal:No arthralgias, no back pain, no gait disturbance or myalgias. Neurological: No dizziness, no headaches, no numbness, no seizures, no syncope, no weakness, no tremors. Hematologic: No lymphadenopathy, no easy bruising. Psychiatric: No confusion, no  hallucinations, no sleep disturbance.    Physical Exam: Filed Vitals:   12/23/12 1343  BP: 110/68  Pulse: 56   the general appearance reveals an elderly gentleman in no distress.  He walks with a cane secondary to postoperative problems following hip replacement.The head and neck exam reveals pupils equal and reactive.  Extraocular movements are full.  There is no scleral icterus.  The mouth and pharynx are normal.  The neck is supple.  The carotids reveal no bruits.  The jugular venous pressure is normal.  The  thyroid is not enlarged.  There is no lymphadenopathy.  The chest is clear to percussion and auscultation.  There are no rales or rhonchi.  Expansion of the chest is symmetrical.  The precordium is quiet.  The first heart sound is normal.  The second heart sound is physiologically split.  There is no murmur gallop rub or click.  There is no abnormal lift or heave.  The abdomen is soft and nontender.  The bowel sounds are normal.  The liver and spleen are not enlarged.  There are no abdominal masses.  There are no abdominal bruits.  Extremities reveal good pedal pulses.  There is no phlebitis or edema.  There is no cyanosis or clubbing.  Strength is normal and symmetrical in all extremities.  There is no lateralizing weakness.  There are no sensory deficits.  The skin is warm and dry.  There is no rash.  EKG shows sinus bradycardia and is otherwise within normal limits   Assessment / Plan: Continue on same medication.  His echo does show diastolic dysfunction and we will attack with blood pressure control, weight loss, and regular exercise.  Recheck in 6 months for followup office visit.

## 2012-12-23 NOTE — Assessment & Plan Note (Signed)
Blood pressure was remaining stable on current medication.  The patient's weight is up 3 pounds since last visit.

## 2012-12-23 NOTE — Assessment & Plan Note (Signed)
The patient has a history of a heart murmur.  His recent echocardiogram showed only mild aortic insufficiency and mild mitral regurgitation.  The patient has not been having any symptoms of CHF.

## 2012-12-23 NOTE — Patient Instructions (Addendum)
Work harder on [employer] loss  Your physician recommends that you continue on your current medications as directed. Please refer to the Current Medication list given to you today.  Your physician wants you to follow-up in: 6 months  You will receive a reminder letter in the mail two months in advance. If you don't receive a letter, please call our office to schedule the follow-up appointment.

## 2012-12-23 NOTE — Assessment & Plan Note (Signed)
The patient has a history of mild peripheral edema secondary to amlodipine

## 2013-10-12 ENCOUNTER — Other Ambulatory Visit: Payer: Self-pay | Admitting: Dermatology

## 2014-08-30 ENCOUNTER — Encounter: Payer: Self-pay | Admitting: Physician Assistant

## 2014-08-30 ENCOUNTER — Ambulatory Visit (INDEPENDENT_AMBULATORY_CARE_PROVIDER_SITE_OTHER): Payer: Medicare Other | Admitting: Physician Assistant

## 2014-08-30 VITALS — BP 118/74 | HR 52 | Ht 66.0 in | Wt 181.0 lb

## 2014-08-30 DIAGNOSIS — I119 Hypertensive heart disease without heart failure: Secondary | ICD-10-CM

## 2014-08-30 DIAGNOSIS — R0609 Other forms of dyspnea: Secondary | ICD-10-CM

## 2014-08-30 NOTE — Assessment & Plan Note (Signed)
Blood pressure normal

## 2014-08-30 NOTE — Patient Instructions (Signed)
Your physician recommends that you continue on your current medications as directed. Please refer to the Current Medication list given to you today.  Your physician wants you to follow-up in: 6 months with Dr. Brackbill.  You will receive a reminder letter in the mail two months in advance. If you don't receive a letter, please call our office to schedule the follow-up appointment.  

## 2014-08-30 NOTE — Assessment & Plan Note (Signed)
Patient had no complaints of dyspnea on exertion today. He walks with a cane and rides his exercise bike. No change.

## 2014-08-30 NOTE — Progress Notes (Signed)
HPI: This is an 81 year old male patient of Dr. Patty SermonsBrackbill who has a history of hypertension, 2-D echo in 08/2012 EF 55-65% with grade 1 diastolic dysfunction and mild AI and mild MR. He has a history of exertional dyspnea and had an MRI at [facility] 5 or 6 years ago but he can't recall the results.  Patient comes in today with caring literature [facility] on treatment with antidepressants for peripheral neuropathy. He is worried because there is an affiliation with this and what his father died of. The patient has no symptoms and is actually doing quite well. He denies any chest pain, palpitations, dyspnea, dyspnea on exertion, dizziness, edema, or presyncope. He has no numbness or tingling of his hands or feet. He walks with a cane and rides his exercise bike 15-20 minutes several days a week.  Allergies  Allergen Reactions  . Tetanus Toxoids   . Atorvastatin Other (See Comments)    Other Reaction: OTHER REACTION  . Rosuvastatin Other (See Comments)    Other Reaction: OTHER REACTION     Current Outpatient Prescriptions  Medication Sig Dispense Refill  . amLODipine (NORVASC) 5 MG tablet Take 5 mg by mouth daily.      . B Complex Vitamins (B-COMPLEX/B-12 PO) Take by mouth.      . benazepril (LOTENSIN) 20 MG tablet Take 20 mg by mouth 2 (two) times daily.      . calcium carbonate (OS-CAL) 600 MG TABS Take 600 mg by mouth 2 (two) times daily with a meal.      . calcium-vitamin D (OSCAL WITH D) 500-200 MG-UNIT per tablet Take 1 tablet by mouth daily.      . carvedilol (COREG) 12.5 MG tablet Take 12.5 mg by mouth 2 (two) times daily with a meal.    . Cholecalciferol (VITAMIN D) 2000 UNITS CAPS Take by mouth daily.    . Coenzyme Q10 (CO Q-10) 200 MG CAPS Take 200 mg by mouth daily.    . fish oil-omega-3 fatty acids 1000 MG capsule Take 2 g by mouth daily.      . magnesium 30 MG tablet Take 30 mg by mouth 2 (two) times daily.      . mirabegron ER (MYRBETRIQ) 50 MG TB24 Take 50  mg by mouth 2 (two) times daily.    Marland Kitchen. OVER THE COUNTER MEDICATION 2 (two) times daily. Juice plus capsules    . Oxybutynin Chloride (GELNIQUE) 10 % GEL Place onto the skin.      . potassium chloride SA (K-DUR,KLOR-CON) 20 MEQ tablet Take 20 mEq by mouth 2 (two) times daily.    . psyllium (METAMUCIL) 58.6 % powder Take 1 packet by mouth daily.    . timolol (TIMOPTIC) 0.5 % ophthalmic solution 1 drop 2 (two) times daily.     No current facility-administered medications for this visit.    Past Medical History  Diagnosis Date  . Urinary incontinence   . Hypertension   . Hyperlipidemia   . S/P TURP   . Arthritis   . Heart attack     Past Surgical History  Procedure Laterality Date  . Hernia repair    . Prostate surgery    . Hip fusion      lt  . Tonsillectomy    . Wrist surgery    . Cholecystectomy    . Knee surgery      rt knee    No family history on file.  History  Social History  . Marital Status: Married    Spouse Name: N/A    Number of Children: N/A  . Years of Education: N/A   Occupational History  . Not on file.   Social History Main Topics  . Smoking status: Never Smoker   . Smokeless tobacco: Never Used  . Alcohol Use: No  . Drug Use: Not on file  . Sexual Activity: Not on file   Other Topics Concern  . Not on file   Social History Narrative    ROS: See history of present illness otherwise negative  Ht  (1.676 m)  Wt 181 lb (82.101 kg)  BMI 29.23 kg/m2  PHYSICAL EXAM: Well-nournished, in no acute distress. Neck: No JVD, HJR, Bruit, or thyroid enlargement  Lungs: No tachypnea, clear without wheezing, rales, or rhonchi  Cardiovascular: RRR, PMI not displaced, Normal S1 and S2, 1/6 systolic murmur at the left sternal border, no gallops, bruit, thrill, or heave.  Abdomen: BS normal. Soft without organomegaly, masses, lesions or tenderness.  Extremities: Brawny changes of bilateral extremities but excellent pulses otherwise lower  extremities without cyanosis, clubbing or edema. Good distal pulses bilateral  SKin: Warm, no lesions or rashes   Musculoskeletal: No deformities  Neuro: no focal signs   Wt Readings from Last 3 Encounters:  08/30/14 181 lb (82.101 kg)  12/23/12 191 lb 6.4 oz (86.818 kg)  08/25/12 188 lb 3.2 oz (85.367 kg)    Lab Results  Component Value Date   WBC 8.4 03/07/2011   HGB 9.5* 03/07/2011   HCT 26.9* 03/07/2011   PLT 159 03/07/2011   GLUCOSE 115* 03/07/2011   ALT 28 03/04/2011   AST 53* 03/04/2011   NA 136 03/07/2011   K 4.1 03/07/2011   CL 102 03/07/2011   CREATININE 0.62 03/07/2011   BUN 12 03/07/2011   CO2 29 03/07/2011   INR 1.13 02/26/2011    EKG: Sinus bradycardia 52 bpm inferior Q waves, more prominent than prior tracings   2-D echo 09/03/12: Study Conclusions  - Left ventricle: The cavity size was normal. Wall thickness   was increased in a pattern of moderate LVH. Systolic   function was normal. The estimated ejection fraction was   in the range of 55% to 65%. There is hypokinesis of the   posterobasal wall. Doppler parameters are consistent with   abnormal left ventricular relaxation (grade 1 diastolic   dysfunction). Doppler parameters are consistent with high   ventricular filling pressure. - Aortic valve: Mild regurgitation. - Mitral valve: Calcified annulus. Mild regurgitation. - Left atrium: The atrium was mildly dilated. Transthoracic echocardiography.  M-mode, complete 2D, spectral Doppler, and color Doppler.  Height:  Height: 167.6cm. Height: 66in.  Weight:  Weight: 85.3kg. Weight: 187.6lb.  Body mass index:  BMI: 30.3kg/m^2.  Body surface area:    BSA: 1.79m^2.  Blood pressure:     126/70.  Patient status:  Outpatient.  Location:  [facility]

## 2014-09-07 ENCOUNTER — Ambulatory Visit: Payer: No Typology Code available for payment source | Admitting: Cardiology

## 2014-10-06 DIAGNOSIS — D693 Immune thrombocytopenic purpura: Secondary | ICD-10-CM

## 2014-10-06 HISTORY — DX: Immune thrombocytopenic purpura: D69.3

## 2015-03-07 ENCOUNTER — Ambulatory Visit (INDEPENDENT_AMBULATORY_CARE_PROVIDER_SITE_OTHER): Payer: Medicare Other | Admitting: Cardiology

## 2015-03-07 ENCOUNTER — Encounter: Payer: Self-pay | Admitting: Cardiology

## 2015-03-07 VITALS — BP 102/60 | HR 64 | Ht 69.0 in | Wt 184.4 lb

## 2015-03-07 DIAGNOSIS — R42 Dizziness and giddiness: Secondary | ICD-10-CM | POA: Diagnosis not present

## 2015-03-07 DIAGNOSIS — M150 Primary generalized (osteo)arthritis: Secondary | ICD-10-CM | POA: Diagnosis not present

## 2015-03-07 DIAGNOSIS — I119 Hypertensive heart disease without heart failure: Secondary | ICD-10-CM | POA: Diagnosis not present

## 2015-03-07 DIAGNOSIS — R0609 Other forms of dyspnea: Secondary | ICD-10-CM | POA: Diagnosis not present

## 2015-03-07 DIAGNOSIS — M159 Polyosteoarthritis, unspecified: Secondary | ICD-10-CM

## 2015-03-07 NOTE — Patient Instructions (Addendum)
Medication Instructions:  STOP AMLODIPINE    Labwork: NONE  Testing/Procedures: NONE  Follow-Up: Your physician wants you to follow-up in: 6 MONTH OV/EKG  You will receive a reminder letter in the mail two months in advance. If you don't receive a letter, please call our office to schedule the follow-up appointment.

## 2015-03-07 NOTE — Progress Notes (Signed)
Cardiology Office Note   Date:  03/07/2015   ID:  Perry [name], DOB 1929-07-18, MRN [id number]  PCP:  Perry Ferrier, MD  Cardiologist: Perry Clement MD  No chief complaint on file.     History of Present Illness: Perry [name] is a 81 y.o. male who presents for a scheduled follow-up office visit  This 81 year old gentleman is seen for a followup office visit. His wife is a patient in this clinic. The patient has a history of exertional dyspnea. He does not have any history of known ischemic heart disease. He states that he has not had a myocardial infarction. He does have a past history of high blood pressure. He does not recall ever being told that he has a low ejection fraction. He states that approximately 5 years ago he was worked up at [name] and had an MRI of his heart looking for a possible scar but he does not recall the results. The patient denies any exertional chest pain. He denies any dizziness or syncope. He has not been aware of any palpitations. He had an echocardiogram on 09/03/12 which showed an ejection fraction of 55-65% and grade 1 diastolic dysfunction. There was mild aortic insufficiency and mild mitral regurgitation. His past history is positive for overactive bladder for which he sees Dr. Cassell [name]. He has had a trial of PTNS weekly for more than a year and now is on a every 3 weeks schedule. He has nocturia from once a night up to 6 times a night. He has a past history of skin cancer which he is followed at [name]. The patient has not been expressing any chest pain or increased shortness of breath.  He has had some peripheral edema.  He has had some dizzy episodes and his blood pressure today is low.  On July 8 he was in his garage and had a bad fall.  He did not go to the doctor.  He thinks that the fall may have been related to dizziness. He has osteoarthritis of his hips and has an appointment to see Dr. Thurston [name] regarding left hip therapy and possible  surgery.  Past Medical History  Diagnosis Date  . Urinary incontinence   . Hypertension   . Hyperlipidemia   . S/P TURP   . Arthritis   . Heart attack     Past Surgical History  Procedure Laterality Date  . Hernia repair    . Prostate surgery    . Hip fusion      lt  . Tonsillectomy    . Wrist surgery    . Cholecystectomy    . Knee surgery      rt knee     Current Outpatient Prescriptions  Medication Sig Dispense Refill  . B Complex Vitamins (B-COMPLEX/B-12 PO) Take 1 capsule by mouth daily.     . benazepril (LOTENSIN) 20 MG tablet Take 20 mg by mouth 2 (two) times daily.      . calcium-vitamin D (OSCAL WITH D) 500-200 MG-UNIT per tablet Take 1 tablet by mouth daily.      . carvedilol (COREG) 12.5 MG tablet Take 12.5 mg by mouth 2 (two) times daily with a meal.    . Cholecalciferol (VITAMIN D) 2000 UNITS CAPS Take 2,000 Units by mouth daily.     . Coenzyme Q10 (CO Q-10) 200 MG CAPS Take 200 mg by mouth daily.    . fish oil-omega-3 fatty acids 1000 MG capsule Take 2 g by  mouth daily.      . hydrocortisone 2.5 % cream Apply 1 application topically daily as needed. (affected areas)    . magnesium 30 MG tablet Take 30 mg by mouth 2 (two) times daily.      . mirabegron ER (MYRBETRIQ) 50 MG TB24 Take 50 mg by mouth 2 (two) times daily.    Marland Kitchen OVER THE COUNTER MEDICATION Take 1 capsule by mouth 2 (two) times daily. (Juice plus capsules)    . potassium chloride SA (K-DUR,KLOR-CON) 20 MEQ tablet Take 20 mEq by mouth 2 (two) times daily.    . psyllium (METAMUCIL) 58.6 % powder Take 1 packet by mouth daily.    . tadalafil (CIALIS) 20 MG tablet Take 20 mg by mouth daily as needed. (erectile dysfunction)    . timolol (TIMOPTIC) 0.5 % ophthalmic solution Place 1 drop into the left eye 2 (two) times daily.      No current facility-administered medications for this visit.    Allergies:   Tetanus toxoids; Atorvastatin; and Rosuvastatin    Social History:  The patient  reports that he  has quit smoking. His smoking use included Cigars. He has never used smokeless tobacco. He reports that he does not drink alcohol.   Family History:  The patient's family history includes Appendicitis in his brother; Healthy in his mother; Heart disease in his sister; Hypertension in his father and sister; Ovarian cancer in his sister.    ROS:  Please see the history of present illness.   Otherwise, review of systems are positive for none.   All other systems are reviewed and negative.    PHYSICAL EXAM: VS:  BP 102/60 mmHg  Pulse 64  Ht 5\' 9"  (1.753 m)  Wt 184 lb 6.4 oz (83.643 kg)  BMI 27.22 kg/m2  SpO2 94% , BMI Body mass index is 27.22 kg/(m^2). GEN: Well nourished, well developed, in no acute distress HEENT: normal Neck: no JVD, carotid bruits, or masses Cardiac: RRR; no murmurs, rubs, or gallops,no edema  Respiratory:  clear to auscultation bilaterally, normal work of breathing GI: soft, nontender, nondistended, + BS MS: no deformity or atrophy Skin: warm and dry, no rash Neuro:  Strength and sensation are intact Psych: euthymic mood, full affect   EKG:  EKG is not ordered today.    Recent Labs: No results found for requested labs within last 365 days.    Lipid Panel No results found for: CHOL, TRIG, HDL, CHOLHDL, VLDL, LDLCALC, LDLDIRECT    Wt Readings from Last 3 Encounters:  03/07/15 184 lb 6.4 oz (83.643 kg)  08/30/14 181 lb (82.101 kg)  12/23/12 191 lb 6.4 oz (86.818 kg)         ASSESSMENT AND PLAN:  1.  Benign hypertensive heart disease without heart failure.  Ejection fraction 55-65%. 2.  Systolic heart murmur.  Echocardiogram on 09/03/12 shows mild mitral regurgitation and mild aortic insufficiency.  There is normal systolic function with grade 1 diastolic dysfunction. 3.  Mild exertional dyspnea secondary to diastolic dysfunction. 4.  Low blood pressure with dizziness  Current medicines are reviewed at length with the patient today.  The patient  has concerns regarding medicines.  The following changes have been made:  We will stop his amlodipine.  This will help his blood pressure to increase and may also help decrease his intermittent pedal edema  Labs/ tests ordered today include:  No orders of the defined types were placed in this encounter.     Disposition: Stop amlodipine.  Continue other medicines the same.  Recheck in 6 months for office visit and EKG.  He will monitor his blood pressure at home in the interim and let us know if he has any wide swings in blood pressure.  Karie Schwalbe MD 03/07/2015 6:01 PM    [facility] [address] [place], [place], [place]  [address] Phone: [phone]; Fax: [phone]

## 2015-09-05 ENCOUNTER — Other Ambulatory Visit: Payer: Self-pay | Admitting: Internal Medicine

## 2015-09-05 DIAGNOSIS — R270 Ataxia, unspecified: Secondary | ICD-10-CM

## 2015-09-12 ENCOUNTER — Ambulatory Visit
Admission: RE | Admit: 2015-09-12 | Discharge: 2015-09-12 | Disposition: A | Discharge status: Home / Self Care | Payer: Medicare Other | Source: Ambulatory Visit | Attending: Internal Medicine | Admitting: Internal Medicine

## 2015-09-12 DIAGNOSIS — I6782 Cerebral ischemia: Secondary | ICD-10-CM | POA: Diagnosis not present

## 2015-09-12 DIAGNOSIS — G319 Degenerative disease of nervous system, unspecified: Secondary | ICD-10-CM | POA: Diagnosis not present

## 2015-09-12 DIAGNOSIS — I6529 Occlusion and stenosis of unspecified carotid artery: Secondary | ICD-10-CM | POA: Insufficient documentation

## 2015-09-12 DIAGNOSIS — R270 Ataxia, unspecified: Secondary | ICD-10-CM | POA: Insufficient documentation

## 2015-10-05 ENCOUNTER — Encounter: Payer: Self-pay | Admitting: Cardiology

## 2015-10-05 ENCOUNTER — Ambulatory Visit (INDEPENDENT_AMBULATORY_CARE_PROVIDER_SITE_OTHER): Payer: Medicare Other | Admitting: Cardiology

## 2015-10-05 VITALS — BP 140/68 | HR 58 | Ht 69.5 in | Wt 178.6 lb

## 2015-10-05 DIAGNOSIS — R0609 Other forms of dyspnea: Secondary | ICD-10-CM | POA: Diagnosis not present

## 2015-10-05 DIAGNOSIS — M159 Polyosteoarthritis, unspecified: Secondary | ICD-10-CM

## 2015-10-05 DIAGNOSIS — M150 Primary generalized (osteo)arthritis: Secondary | ICD-10-CM

## 2015-10-05 DIAGNOSIS — R609 Edema, unspecified: Secondary | ICD-10-CM | POA: Diagnosis not present

## 2015-10-05 DIAGNOSIS — I119 Hypertensive heart disease without heart failure: Secondary | ICD-10-CM

## 2015-10-05 NOTE — Patient Instructions (Signed)
Medication Instructions:  Your physician recommends that you continue on your current medications as directed. Please refer to the Current Medication list given to you today.  Labwork: none  Testing/Procedures: none  Follow-Up: Your physician wants you to follow-up in: 6 month ov/ekg with Dr Mariah Milling at the [facility] will receive a reminder letter in the mail two months in advance. If you don't receive a letter, please call our office to schedule the follow-up appointment.  Any Other Special Instructions Will Be Listed Below (If Applicable). Try support stockings for edema  Keep legs elevated while sitting   If you need a refill on your cardiac medications before your next appointment, please call your pharmacy.

## 2015-10-05 NOTE — Progress Notes (Signed)
Cardiology Office Note   Date:  10/05/2015   ID:  Perry [name], DOB [birth year], MRN [id number]  PCP:  Perry Ferrier, MD  Cardiologist: Perry Clement MD  Chief Complaint  Patient presents with  . scheduled follow up      History of Present Illness: Perry [name] is a 82 y.o. male who presents for a six-month follow-up visit  This 82 year old gentleman is seen for a followup office visit. His wife is a patient in this clinic. The patient has a history of exertional dyspnea. He does not have any history of known ischemic heart disease. He states that he has not had a myocardial infarction. He does have a past history of high blood pressure. He does not recall ever being told that he has a low ejection fraction. He states that approximately 5 years ago he was worked up at [facility] and had an MRI of his heart looking for a possible scar but he does not recall the results. The patient denies any exertional chest pain. He denies any dizziness or syncope. He has not been aware of any palpitations. He had an echocardiogram on 09/03/12 which showed an ejection fraction of 55-65% and grade 1 diastolic dysfunction. There was mild aortic insufficiency and mild mitral regurgitation. His past history is positive for overactive bladder for which he sees Dr. Cassell [name].  He is now on Myrbetriq. He has a past history of skin cancer for which he is followed at [facility]. The patient has not been expressing any chest pain or increased shortness of breath. He has had some peripheral edema.  At his last visit we stopped his amlodipine but it has not made much difference with his peripheral edema. His blood pressure is now at the high end of normal off amlodipine.  The myrbetriq may also be contributing to a tendency toward a higher blood pressure.  He has chronic peripheral edema and stasis dermatitis related to venous insufficiency.  He does not have any history of DVT.  He had venous Dopplers of his legs in  2012 which were negative for DVT.  Past Medical History  Diagnosis Date  . Urinary incontinence   . Hypertension   . Hyperlipidemia   . S/P TURP   . Arthritis   . Heart attack [facility])     Past Surgical History  Procedure Laterality Date  . Hernia repair    . Prostate surgery    . Hip fusion      lt  . Tonsillectomy    . Wrist surgery    . Cholecystectomy    . Knee surgery      rt knee     Current Outpatient Prescriptions  Medication Sig Dispense Refill  . B Complex Vitamins (B-COMPLEX/B-12 PO) Take 1 capsule by mouth daily.     . benazepril (LOTENSIN) 20 MG tablet Take 20 mg by mouth 2 (two) times daily.      . calcium-vitamin D (OSCAL WITH D) 500-200 MG-UNIT per tablet Take 1 tablet by mouth daily.      . carvedilol (COREG) 12.5 MG tablet Take 12.5 mg by mouth 2 (two) times daily with a meal.    . Cholecalciferol (VITAMIN D) 2000 UNITS CAPS Take 2,000 Units by mouth daily.     . Coenzyme Q10 (CO Q-10) 200 MG CAPS Take 200 mg by mouth daily.    . fish oil-omega-3 fatty acids 1000 MG capsule Take 2 g by mouth daily.      Marland Kitchen  hydrocortisone 2.5 % cream Apply 1 application topically daily as needed. (affected areas)    . magnesium 30 MG tablet Take 30 mg by mouth 2 (two) times daily.      . mirabegron ER (MYRBETRIQ) 50 MG TB24 Take 50 mg by mouth 2 (two) times daily.    Marland Kitchen OVER THE COUNTER MEDICATION Take 1 capsule by mouth 2 (two) times daily. (Juice plus capsules)    . potassium chloride SA (K-DUR,KLOR-CON) 20 MEQ tablet Take 20 mEq by mouth 2 (two) times daily.    . psyllium (METAMUCIL) 58.6 % powder Take 1 packet by mouth daily.    . tadalafil (CIALIS) 20 MG tablet Take 20 mg by mouth daily as needed. (erectile dysfunction)    . timolol (TIMOPTIC) 0.5 % ophthalmic solution Place 1 drop into the left eye 2 (two) times daily.      No current facility-administered medications for this visit.    Allergies:   Tetanus toxoids; Atorvastatin; and Rosuvastatin    Social History:   The patient  reports that he has quit smoking. His smoking use included Cigars. He has never used smokeless tobacco. He reports that he does not drink alcohol.   Family History:  The patient's family history includes Appendicitis in his brother; Healthy in his mother; Heart disease in his sister; Hypertension in his father and sister; Ovarian cancer in his sister.    ROS:  Please see the history of present illness.   Otherwise, review of systems are positive for none.   All other systems are reviewed and negative.    PHYSICAL EXAM: VS:  BP 140/68 mmHg  Pulse 58  Ht 5' 9.5" (1.765 m)  Wt 178 lb 9.6 oz (81.012 kg)  BMI 26.01 kg/m2 , BMI Body mass index is 26.01 kg/(m^2). GEN: Well nourished, well developed, in no acute distress HEENT: normal Neck: no JVD, carotid bruits, or masses Cardiac: RRR; no murmurs, rubs, or gallops, is 2+ soft bilateral pitting edema with overlying chronic stasis dermatitis. Respiratory:  clear to auscultation bilaterally, normal work of breathing GI: soft, nontender, nondistended, + BS MS: no deformity or atrophy Skin: warm and dry, no rash Neuro:  Strength and sensation are intact.  Patient walks with a cane and he has a very shuffling slow gait Psych: euthymic mood, full affect   EKG:  EKG is not ordered today.    Recent Labs: No results found for requested labs within last 365 days.    Lipid Panel No results found for: CHOL, TRIG, HDL, CHOLHDL, VLDL, LDLCALC, LDLDIRECT    Wt Readings from Last 3 Encounters:  10/05/15 178 lb 9.6 oz (81.012 kg)  03/07/15 184 lb 6.4 oz (83.643 kg)  08/30/14 181 lb (82.101 kg)         ASSESSMENT AND PLAN:  1.Benign hypertensive heart disease without heart failure. Ejection fraction 55-65%. 2. Systolic heart murmur. Echocardiogram on 09/03/12 shows mild mitral regurgitation and mild aortic insufficiency. There is normal systolic function with grade 1 diastolic dysfunction. 3. Mild exertional dyspnea  secondary to diastolic dysfunction. 4. Chronic venous insufficiency of lower extremities with stasis dermatitis and peripheral edema.   Current medicines are reviewed at length with the patient today.  The patient does not have concerns regarding medicines.  The following changes have been made:  no change  Labs/ tests ordered today include:  No orders of the defined types were placed in this encounter.     Disposition:   Continue current medication.  He has a  past history of orthostatic hypotension so we will allow mild permissive hypertension.  I have asked him to try using some support hose for his peripheral edema.  The patient should put them on first thing in the morning and take off at bedtime. Following my retirement the patient will follow-up with Dr. Mariah Milling  in [place] in 6 months. Karie Schwalbe MD 10/05/2015 1:53 PM    [facility] [address] [place], [place], [place]  [address] Phone: [phone]; Fax: [phone]

## 2015-10-24 ENCOUNTER — Other Ambulatory Visit: Payer: Self-pay | Admitting: Neurology

## 2015-10-24 DIAGNOSIS — R2689 Other abnormalities of gait and mobility: Secondary | ICD-10-CM

## 2015-10-24 DIAGNOSIS — R42 Dizziness and giddiness: Secondary | ICD-10-CM

## 2015-11-08 ENCOUNTER — Ambulatory Visit: Payer: Medicare Other | Attending: Neurology | Admitting: Physical Therapy

## 2015-11-08 ENCOUNTER — Encounter: Payer: Self-pay | Admitting: Physical Therapy

## 2015-11-08 DIAGNOSIS — R262 Difficulty in walking, not elsewhere classified: Secondary | ICD-10-CM | POA: Insufficient documentation

## 2015-11-08 DIAGNOSIS — M6281 Muscle weakness (generalized): Secondary | ICD-10-CM | POA: Insufficient documentation

## 2015-11-08 NOTE — Therapy (Signed)
[facility] [address] , [place], [address] Phone: [phone]   Fax:  [phone]  Physical Therapy Evaluation  Patient Details  Name: Perry BaliLacy R [name] MRN: [id number] Date of Birth: 06/21/1929 Referring Provider: Dr. Clelia CroftShaw  Encounter Date: 11/08/2015      PT End of Session - 11/08/15 1321    Visit Number 1   Number of Visits 25   Date for PT Re-Evaluation 01/31/16   Authorization Type g codes   PT Start Time 0110   PT Stop Time 0200   PT Time Calculation (min) 50 min      Past Medical History  Diagnosis Date  . Urinary incontinence   . Hypertension   . Hyperlipidemia   . S/P TURP   . Arthritis   . Heart attack [facility](HCC)     Past Surgical History  Procedure Laterality Date  . Hernia repair    . Prostate surgery    . Hip fusion      lt  . Tonsillectomy    . Wrist surgery    . Cholecystectomy    . Knee surgery      rt knee    There were no vitals filed for this visit.  Visit Diagnosis:  Difficulty in walking, not elsewhere classified  Muscle weakness (generalized)      Subjective Assessment - 11/08/15 1315    Subjective Patient reports that he has poor balance and has a history of falls.    Pertinent History airtuc riit dukatatuibm airtuc sckerisus, CAD, depression wiht anxiety, diverticulosis, hyperlipidemia, HTN, Hyperthyroidism, leg edama, OA, palpatiions, BLE hip replacement, R TKR,    Currently in Pain? Yes   Pain Score 1    Pain Location Foot   Pain Orientation Right            [facility] Assessment - 11/08/15 0001    Assessment   Medical Diagnosis imbalance   Referring Provider Dr. Clelia CroftShaw   Onset Date/Surgical Date 10/19/15   Hand Dominance Right   Next MD Visit june 2017   Prior Therapy yes   Precautions   Precautions Fall   Restrictions   Weight Bearing Restrictions No   Balance Screen   Has the patient fallen in the past 6 months Yes   How many times? 2   Has the  patient had a decrease in activity level because of a fear of falling?  Yes   Is the patient reluctant to leave their home because of a fear of falling?  No   Home [employer] information [employer] managernvironment   Living Environment Private residence   Living Arrangements Spouse/significant other   Available Help at Discharge Family   Type of Home House   Home Access Stairs to enter   Entrance Stairs-Number of Steps 1   Entrance Stairs-Rails None   Home Layout One level   Home Equipment [place] - single point;Walker - 2 wheels;Grab bars - toilet   Prior Function   Level of Independence Independent   Vocation Retired   [employer]   Overall Cognitive Status Within Functional Limits for tasks assessed      PAIN: Patient reports that his right foot is painful intermittently  POSTURE: flexed trunk   PROM/AROM: WFL BLE STRENGTH:  Graded on a 0-5 scale Muscle Group Left Right                          Hip Flex 3 3  Hip Abd  2 2  Hip Add 2 2  Hip Ext 2 2  Hip IR/ER 3 3  Knee Flex 3 3  Knee Ext 3 3  Ankle DF 3 3  Ankle PF 3 3   SENSATION: intact   FUNCTIONAL MOBILITY: Slow mobiity BALANCE:unable to tandem stand, unable to single leg stand  GAIT: Patient ambulates with spc and independent with shuffling ait  OUTCOME MEASURES: TEST Outcome Interpretation  5 times sit<>stand 25.44sec >80 yo, >15 sec indicates increased risk for falls  10 meter walk test  . 46                m/s <1.0 m/s indicates increased risk for falls; limited community ambulator  Timed up and Go   24.73              sec <14 sec indicates increased risk for falls  6 minute walk test    350           Feet 1000 feet is community ambulator                                   PT Education - 11-16-2015 1320    Education provided Yes   Education Details plan of care   Person(s) Educated Patient   Methods Explanation   Comprehension Verbalized understanding             PT Long Term Goals - 11/16/15 1343    PT LONG  TERM GOAL #1   Title Patient will be independent in home exercise program to improve strength/mobility for better functional independence with ADLs   Time 12   Period Weeks   Status New   PT LONG TERM GOAL #2   Title Patient will increase six minute walk test distance to >1000 for progression to community ambulator and improve gait ability   Time 12   Period Weeks   Status New   PT LONG TERM GOAL #3   Title Patient will increase 10 meter walk test to >1.63m/s as to improve gait speed for better community ambulation and to reduce fall risk   Time 12   Period Weeks   Status New   PT LONG TERM GOAL #4   Title Patient will reduce timed up and go to <11 seconds to reduce fall risk and demonstrate improved transfer/gait ability.   Time 12   Period Weeks   Status New               Plan - 11-16-2015 1322    Clinical Impression Statement Pateint has dx of imbalance and has difficulty walking intermediate and long distances. He has weakness in his legs and decresed static and dynamic standing balance.    Pt will benefit from skilled therapeutic intervention in order to improve on the following deficits Abnormal gait;Decreased activity tolerance;Decreased balance;Difficulty walking;Decreased strength;Impaired flexibility;Pain;Decreased mobility;Decreased endurance   Rehab Potential Good   Clinical Impairments Affecting Rehab Potential patients clinical presentation is stable, he has 13 steps to go down to the basement to use his stationary bike,    PT Frequency 2x / week   PT Duration 12 weeks   PT Treatment/Interventions Therapeutic exercise;Balance training;Therapeutic activities;Gait training;Stair training   PT Next Visit Plan strengthening and balance   PT Home Exercise Plan HEP          G-Codes - 16-Nov-2015 1334    Functional Assessment Tool Used TUG, 10  MW, 6 MW, 5 x sit to  stand   Functional Limitation Mobility: Walking and moving around   Mobility: Walking and Moving Around  Current Status [phone]) At least 60 percent but less than 80 percent impaired, limited or restricted   Mobility: Walking and Moving Around Goal Status [phone]) At least 40 percent but less than 60 percent impaired, limited or restricted       Problem List Patient Active Problem List   Diagnosis Date Noted  . Benign hypertensive heart disease without heart failure 12/23/2012  . Dyspnea on exertion 08/25/2012  . Pedal edema 08/25/2012  . Heart murmur 08/25/2012  . Right inguinal hernia 02/28/2011   [name], PT, DPT [place], Barkley Bruns S 11/08/2015, 1:45 PM  [facility] MAIN [facility] [address] [place], [place], [address] Phone: [phone]   Fax:  [phone]  Name: RAYFORD [name] MRN: [id number] Date of Birth: [birth year]

## 2015-11-13 ENCOUNTER — Ambulatory Visit
Admission: RE | Admit: 2015-11-13 | Discharge: 2015-11-13 | Disposition: A | Discharge status: Home / Self Care | Payer: Medicare Other | Source: Ambulatory Visit | Attending: Neurology | Admitting: Neurology

## 2015-11-13 DIAGNOSIS — R42 Dizziness and giddiness: Secondary | ICD-10-CM | POA: Diagnosis present

## 2015-11-13 DIAGNOSIS — R2689 Other abnormalities of gait and mobility: Secondary | ICD-10-CM | POA: Diagnosis not present

## 2015-11-13 DIAGNOSIS — G319 Degenerative disease of nervous system, unspecified: Secondary | ICD-10-CM | POA: Diagnosis not present

## 2015-11-13 DIAGNOSIS — I6782 Cerebral ischemia: Secondary | ICD-10-CM | POA: Insufficient documentation

## 2015-11-13 DIAGNOSIS — I618 Other nontraumatic intracerebral hemorrhage: Secondary | ICD-10-CM | POA: Insufficient documentation

## 2015-11-14 ENCOUNTER — Encounter: Payer: Self-pay | Admitting: Physical Therapy

## 2015-11-14 ENCOUNTER — Ambulatory Visit: Payer: Medicare Other | Attending: Neurology | Admitting: Physical Therapy

## 2015-11-14 DIAGNOSIS — M6281 Muscle weakness (generalized): Secondary | ICD-10-CM | POA: Diagnosis present

## 2015-11-14 DIAGNOSIS — R262 Difficulty in walking, not elsewhere classified: Secondary | ICD-10-CM | POA: Insufficient documentation

## 2015-11-14 NOTE — Therapy (Signed)
[facility] [address] [place], [place], [address] Phone: [phone]   Fax:  [phone]  Physical Therapy Treatment  Patient Details  Name: Perry BaliLacy R Banning MRN: [id number] Date of Birth: 08/11/1929 Referring Provider: Dr. Clelia CroftShaw  Encounter Date: 11/14/2015      PT End of Session - 11/14/15 1746    Visit Number 2   Number of Visits 25   Date for PT Re-Evaluation 01/31/16   Authorization Type g codes   PT Start Time 0540   PT Stop Time 0620   PT Time Calculation (min) 40 min      Past Medical History  Diagnosis Date  . Urinary incontinence   . Hypertension   . Hyperlipidemia   . S/P TURP   . Arthritis   . Heart attack [facility](HCC)     Past Surgical History  Procedure Laterality Date  . Hernia repair    . Prostate surgery    . Hip fusion      lt  . Tonsillectomy    . Wrist surgery    . Cholecystectomy    . Knee surgery      rt knee    There were no vitals filed for this visit.  Visit Diagnosis:  Difficulty in walking, not elsewhere classified  Muscle weakness (generalized)      Subjective Assessment - 11/14/15 1745    Subjective Patient reports that he has poor balance and has a history of falls.    Pertinent History airtuc riit dukatatuibm airtuc sckerisus, CAD, depression wiht anxiety, diverticulosis, hyperlipidemia, HTN, Hyperthyroidism, leg edama, OA, palpatiions, BLE hip replacement, R TKR,    Currently in Pain? No/denies      THER-EX Standing exercises with 2# ankle weights: Marching 2 x 10; SLR 2 x 10; Abduction 2 x 10; Extension 2 x 10; Knee flexion 2 x 10; Heel raises 2 x 10;  Resisted side-steeping RTB 4 lengths x 2; Standing mini squats 2 x 10 with RTB around knees to encourage abduction; Sit to stand without UE support 2 x 10; Step-ups to 6" step x 10 bilateral; Patient needs occasional verbal cueing to improve posture and cueing to correctly perform exercises slowly, holding at end  of range to increase motor firing of desired muscle to encourage fatigue.                             PT Education - 11/14/15 1746    Education provided Yes   Education Details plan of care   Person(s) Educated Patient   Methods Explanation   Comprehension Verbalized understanding             PT Long Term Goals - 11/08/15 1343    PT LONG TERM GOAL #1   Title Patient will be independent in home exercise program to improve strength/mobility for better functional independence with ADLs   Time 12   Period Weeks   Status New   PT LONG TERM GOAL #2   Title Patient will increase six minute walk test distance to >1000 for progression to community ambulator and improve gait ability   Time 12   Period Weeks   Status New   PT LONG TERM GOAL #3   Title Patient will increase 10 meter walk test to >1.3438m/s as to improve gait speed for better community ambulation and to reduce fall risk   Time 12   Period Weeks   Status  New   PT LONG TERM GOAL #4   Title Patient will reduce timed up and go to <11 seconds to reduce fall risk and demonstrate improved transfer/gait ability.   Time 12   Period Weeks   Status New               Plan - 11/14/15 1746    Clinical Impression Statement Patient has weakness in BLE and decreased standing balance and has hx of 2 falss in the last 6 months. He is abl eto perform exercises and balance.    Pt will benefit from skilled therapeutic intervention in order to improve on the following deficits Abnormal gait;Decreased activity tolerance;Decreased balance;Difficulty walking;Decreased strength;Impaired flexibility;Pain;Decreased mobility;Decreased endurance   Rehab Potential Good   Clinical Impairments Affecting Rehab Potential patients clinical presentation is stable, he has 13 steps to go down to the basement to use his stationary bike,    PT Frequency 2x / week   PT Duration 12 weeks   PT Treatment/Interventions Therapeutic  exercise;Balance training;Therapeutic activities;Gait training;Stair training   PT Next Visit Plan strengthening and balance   PT Home Exercise Plan HEP        Problem List Patient Active Problem List   Diagnosis Date Noted  . Benign hypertensive heart disease without heart failure 12/23/2012  . Dyspnea on exertion 08/25/2012  . Pedal edema 08/25/2012  . Heart murmur 08/25/2012  . Right inguinal hernia 02/28/2011   [name], PT, DPT [place], Barkley Bruns S 11/14/2015, 5:57 PM  [place] [facility] MAIN [facility] SERVICES [address] [place], [place], [address] Phone: [phone]   Fax:  [phone]  Name: GIORGIO CHABOT MRN: [id number] Date of Birth: [birth year]

## 2015-11-16 ENCOUNTER — Ambulatory Visit: Payer: Medicare Other | Admitting: Physical Therapy

## 2015-11-20 ENCOUNTER — Ambulatory Visit: Payer: Medicare Other | Admitting: Physical Therapy

## 2015-11-23 ENCOUNTER — Encounter: Payer: Self-pay | Admitting: Physical Therapy

## 2015-11-23 ENCOUNTER — Ambulatory Visit: Payer: Medicare Other | Admitting: Physical Therapy

## 2015-11-23 DIAGNOSIS — R262 Difficulty in walking, not elsewhere classified: Secondary | ICD-10-CM | POA: Diagnosis not present

## 2015-11-23 DIAGNOSIS — M6281 Muscle weakness (generalized): Secondary | ICD-10-CM

## 2015-11-23 NOTE — Therapy (Signed)
[facility] [address] , [place], [address] Phone: [phone]   Fax:  [phone]  Physical Therapy Treatment  Patient Details  Name: Perry BaliLacy R [name] MRN: [id number] Date of Birth: 08/09/1929 Referring Provider: Dr. Clelia CroftShaw  Encounter Date: 11/23/2015      PT End of Session - 11/23/15 1343    Visit Number 3   Number of Visits 25   Date for PT Re-Evaluation 01/31/16   Authorization Type g codes   PT Start Time 1300   PT Stop Time 1345   PT Time Calculation (min) 45 min   Equipment Utilized During Treatment Gait belt   Activity Tolerance Patient tolerated treatment well      Past Medical History  Diagnosis Date  . Urinary incontinence   . Hypertension   . Hyperlipidemia   . S/P TURP   . Arthritis   . Heart attack [facility](HCC)     Past Surgical History  Procedure Laterality Date  . Hernia repair    . Prostate surgery    . Hip fusion      lt  . Tonsillectomy    . Wrist surgery    . Cholecystectomy    . Knee surgery      rt knee    There were no vitals filed for this visit.      Subjective Assessment - 11/23/15 1308    Subjective Pt is doing well today. Reports no falls since last visit.   Pertinent History airtuc riit dukatatuibm airtuc sckerisus, CAD, depression wiht anxiety, diverticulosis, hyperlipidemia, HTN, Hyperthyroidism, leg edama, OA, palpatiions, BLE hip replacement, R TKR,    Currently in Pain? No/denies      Therex:  Standing exercises with 3# ankle weights: LAQs 2x10 Marching 2 x 10; SLR 2 x 10; Abduction 2 x 10; Extension 2 x 10;  Supine: SLR, hip abd slides, heel slides, hip add squeezes and ankle pumps x10 each  Provided and reviewed HEP for LE strengthening. Pt demonstrated understanding of HEP.  Cues for set up and proper technique of exercises to target appropriate muscles  Gait: Ambulated 75 ft x2 with SPC with min guard. Frequent cues for increased step length ("big  steps") and heel strike. Pt was able to reduce shuffling gait pattern with cues.                             PT Education - 11/23/15 1322    Education provided Yes   Education Details HEP for LE strengthening   Person(s) Educated Patient   Methods Explanation;Demonstration;Handout   Comprehension Verbalized understanding;Returned demonstration             PT Long Term Goals - 11/08/15 1343    PT LONG TERM GOAL #1   Title Patient will be independent in home exercise program to improve strength/mobility for better functional independence with ADLs   Time 12   Period Weeks   Status New   PT LONG TERM GOAL #2   Title Patient will increase six minute walk test distance to >1000 for progression to community ambulator and improve gait ability   Time 12   Period Weeks   Status New   PT LONG TERM GOAL #3   Title Patient will increase 10 meter walk test to >1.5459m/s as to improve gait speed for better community ambulation and to reduce fall risk   Time 12   Period Weeks  Status New   PT LONG TERM GOAL #4   Title Patient will reduce timed up and go to <11 seconds to reduce fall risk and demonstrate improved transfer/gait ability.   Time 12   Period Weeks   Status New               Plan - 11/23/15 1323    Clinical Impression Statement Pt was able to perform LE therex with increased intensity today. He will benefit from continued strength, balance and gait training to increase funcitonal mobility and reduce risk of falls.   Rehab Potential Good   Clinical Impairments Affecting Rehab Potential patients clinical presentation is stable, he has 13 steps to go down to the basement to use his stationary bike,    PT Frequency 2x / week   PT Duration 12 weeks   PT Treatment/Interventions Therapeutic exercise;Balance training;Therapeutic activities;Gait training;Stair training   PT Next Visit Plan strengthening and balance   PT Home Exercise Plan supine therex  given 4/12   Consulted and Agree with Plan of Care Patient      Patient will benefit from skilled therapeutic intervention in order to improve the following deficits and impairments:  Abnormal gait, Decreased activity tolerance, Decreased balance, Difficulty walking, Decreased strength, Impaired flexibility, Pain, Decreased mobility, Decreased endurance  Visit Diagnosis: Difficulty in walking, not elsewhere classified  Muscle weakness (generalized)     Problem List Patient Active Problem List   Diagnosis Date Noted  . Benign hypertensive heart disease without heart failure 12/23/2012  . Dyspnea on exertion 08/25/2012  . Pedal edema 08/25/2012  . Heart murmur 08/25/2012  . Right inguinal hernia 02/28/2011    Adelene Idler, PT, DPT  11/23/2015, 1:58 PM [phone]  [facility] MAIN [facility] SERVICES [address] [place], [place], [address] Phone: [phone]   Fax:  [phone]  Name: Perry [name] MRN: [id number] Date of Birth: 1929/03/29

## 2015-11-23 NOTE — Patient Instructions (Addendum)
Provided handout and reviewed HEP for LE strengthening including LAQs, supine:hip abd slide, add squeezes, SLR, heel slides and ankle pumps 2x10. From www.hep2go.com. Warm up: Nustep L3 x 3 min (unbilled)  .

## 2015-11-28 ENCOUNTER — Ambulatory Visit: Payer: Medicare Other | Admitting: Physical Therapy

## 2015-11-28 ENCOUNTER — Encounter: Payer: Self-pay | Admitting: Physical Therapy

## 2015-11-28 DIAGNOSIS — G9389 Other specified disorders of brain: Secondary | ICD-10-CM | POA: Insufficient documentation

## 2015-11-28 DIAGNOSIS — R262 Difficulty in walking, not elsewhere classified: Secondary | ICD-10-CM

## 2015-11-28 DIAGNOSIS — M6281 Muscle weakness (generalized): Secondary | ICD-10-CM

## 2015-11-28 NOTE — Therapy (Signed)
[facility] [address] [place], [place], [address] Phone: [phone]   Fax:  [phone]  Physical Therapy Treatment  Patient Details  Name: Pierre BaliLacy R Kunda MRN: [id number] Date of Birth: 05/29/1929 Referring Provider: Dr. Clelia CroftShaw  Encounter Date: 11/28/2015      PT End of Session - 11/28/15 1132    Visit Number 4   Number of Visits 25   Date for PT Re-Evaluation 01/31/16   Authorization Type g codes   PT Start Time 1120   PT Stop Time 1200   PT Time Calculation (min) 40 min   Equipment Utilized During Treatment Gait belt   Activity Tolerance Patient tolerated treatment well      Past Medical History  Diagnosis Date  . Urinary incontinence   . Hypertension   . Hyperlipidemia   . S/P TURP   . Arthritis   . Heart attack [facility](HCC)     Past Surgical History  Procedure Laterality Date  . Hernia repair    . Prostate surgery    . Hip fusion      lt  . Tonsillectomy    . Wrist surgery    . Cholecystectomy    . Knee surgery      rt knee    There were no vitals filed for this visit.      Subjective Assessment - 11/28/15 1130    Subjective Pt is doing well today. Reports no falls since last visit.   Pertinent History airtuc riit dukatatuibm airtuc sckerisus, CAD, depression wiht anxiety, diverticulosis, hyperlipidemia, HTN, Hyperthyroidism, leg edama, OA, palpatiions, BLE hip replacement, R TKR,     Therapeutic exercise and neuromuscular training:  standing hip abd with YTB x 20  side stepping left and right in parallel bars 10 feet x 3 standing on blue foam with cone reaching x 20 across midline step ups from floor to 6 inch stool x 20 bilateral sit to stand x 10 marching in parallel bars x 20 TM walking for 3 minutes x 3 at . 4 miles/hour Patient needs occasional verbal cueing to improve posture and cueing to correctly perform exercises slowly, holding at end of range to increase motor firing of desired  muscle to encourage fatigue.                             PT Education - 11/28/15 1131    Education provided Yes   Education Details HEP   Person(s) Educated Patient   Methods Explanation   Comprehension Verbalized understanding             PT Long Term Goals - 11/08/15 1343    PT LONG TERM GOAL #1   Title Patient will be independent in home exercise program to improve strength/mobility for better functional independence with ADLs   Time 12   Period Weeks   Status New   PT LONG TERM GOAL #2   Title Patient will increase six minute walk test distance to >1000 for progression to community ambulator and improve gait ability   Time 12   Period Weeks   Status New   PT LONG TERM GOAL #3   Title Patient will increase 10 meter walk test to >1.2814m/s as to improve gait speed for better community ambulation and to reduce fall risk   Time 12   Period Weeks   Status New   PT LONG TERM GOAL #4   Title  Patient will reduce timed up and go to <11 seconds to reduce fall risk and demonstrate improved transfer/gait ability.   Time 12   Period Weeks   Status New               Plan - 11/28/15 1132    Clinical Impression Statement Min cuing needed to maintain posture while performing balance and exercise activities.   Rehab Potential Good   Clinical Impairments Affecting Rehab Potential patients clinical presentation is stable, he has 13 steps to go down to the basement to use his stationary bike,    PT Frequency 2x / week   PT Duration 12 weeks   PT Treatment/Interventions Therapeutic exercise;Balance training;Therapeutic activities;Gait training;Stair training   PT Next Visit Plan strengthening and balance   PT Home Exercise Plan supine therex given 4/12   Consulted and Agree with Plan of Care Patient      Patient will benefit from skilled therapeutic intervention in order to improve the following deficits and impairments:  Abnormal gait, Decreased  activity tolerance, Decreased balance, Difficulty walking, Decreased strength, Impaired flexibility, Pain, Decreased mobility, Decreased endurance  Visit Diagnosis: Difficulty in walking, not elsewhere classified  Muscle weakness (generalized)     Problem List Patient Active Problem List   Diagnosis Date Noted  . Benign hypertensive heart disease without heart failure 12/23/2012  . Dyspnea on exertion 08/25/2012  . Pedal edema 08/25/2012  . Heart murmur 08/25/2012  . Right inguinal hernia 02/28/2011  [name], PT, DPT  [place] S 11/28/2015, 11:34 AM  [place] [facility] MAIN [facility] SERVICES [address] [place], [place], [address] Phone: [phone]   Fax:  [phone]  Name: MEKHI SONN MRN: [id number] Date of Birth: [birth year]

## 2015-11-29 ENCOUNTER — Emergency Department
Admission: EM | Admit: 2015-11-29 | Discharge: 2015-11-30 | Disposition: A | Discharge status: Home / Self Care | Payer: Medicare Other | Attending: Emergency Medicine | Admitting: Emergency Medicine

## 2015-11-29 ENCOUNTER — Emergency Department: Payer: Medicare Other

## 2015-11-29 ENCOUNTER — Encounter: Payer: Self-pay | Admitting: *Deleted

## 2015-11-29 DIAGNOSIS — Z87891 Personal history of nicotine dependence: Secondary | ICD-10-CM | POA: Insufficient documentation

## 2015-11-29 DIAGNOSIS — S52501A Unspecified fracture of the lower end of right radius, initial encounter for closed fracture: Secondary | ICD-10-CM | POA: Insufficient documentation

## 2015-11-29 DIAGNOSIS — E785 Hyperlipidemia, unspecified: Secondary | ICD-10-CM | POA: Diagnosis not present

## 2015-11-29 DIAGNOSIS — S6991XA Unspecified injury of right wrist, hand and finger(s), initial encounter: Secondary | ICD-10-CM | POA: Diagnosis present

## 2015-11-29 DIAGNOSIS — I10 Essential (primary) hypertension: Secondary | ICD-10-CM | POA: Diagnosis not present

## 2015-11-29 DIAGNOSIS — Y939 Activity, unspecified: Secondary | ICD-10-CM | POA: Diagnosis not present

## 2015-11-29 DIAGNOSIS — Y92511 Restaurant or cafe as the place of occurrence of the external cause: Secondary | ICD-10-CM | POA: Insufficient documentation

## 2015-11-29 DIAGNOSIS — W1839XA Other fall on same level, initial encounter: Secondary | ICD-10-CM | POA: Diagnosis not present

## 2015-11-29 DIAGNOSIS — Y999 Unspecified external cause status: Secondary | ICD-10-CM | POA: Insufficient documentation

## 2015-11-29 MED ORDER — HYDROCODONE-ACETAMINOPHEN 5-325 MG PO TABS: 1.0000 | ORAL_TABLET | ORAL | Status: AC | PRN

## 2015-11-29 NOTE — Discharge Instructions (Signed)
Cast or Splint Care °Casts and splints support injured limbs and keep bones from moving while they heal. It is important to care for your cast or splint at home.   °HOME CARE INSTRUCTIONS °· Keep the cast or splint uncovered during the drying period. It can take 24 to 48 hours to dry if it is made of plaster. A fiberglass cast will dry in less than 1 hour. °· Do not rest the cast on anything harder than a pillow for the first 24 hours. °· Do not put weight on your injured limb or apply pressure to the cast until your health care provider gives you permission. °· Keep the cast or splint dry. Wet casts or splints can lose their shape and may not support the limb as well. A wet cast that has lost its shape can also create harmful pressure on your skin when it dries. Also, wet skin can become infected. °· Cover the cast or splint with a plastic bag when bathing or when out in the rain or snow. If the cast is on the trunk of the body, take sponge baths until the cast is removed. °· If your cast does become wet, dry it with a towel or a blow dryer on the cool setting only. °· Keep your cast or splint clean. Soiled casts may be wiped with a moistened cloth. °· Do not place any hard or soft foreign objects under your cast or splint, such as cotton, toilet paper, lotion, or powder. °· Do not try to scratch the skin under the cast with any object. The object could get stuck inside the cast. Also, scratching could lead to an infection. If itching is a problem, use a blow dryer on a cool setting to relieve discomfort. °· Do not trim or cut your cast or remove padding from inside of it. °· Exercise all joints next to the injury that are not immobilized by the cast or splint. For example, if you have a long leg cast, exercise the hip joint and toes. If you have an arm cast or splint, exercise the shoulder, elbow, thumb, and fingers. °· Elevate your injured arm or leg on 1 or 2 pillows for the first 1 to 3 days to decrease  swelling and pain. It is best if you can comfortably elevate your cast so it is higher than your heart. °SEEK MEDICAL CARE IF:  °· Your cast or splint cracks. °· Your cast or splint is too tight or too loose. °· You have unbearable itching inside the cast. °· Your cast becomes wet or develops a soft spot or area. °· You have a bad smell coming from inside your cast. °· You get an object stuck under your cast. °· Your skin around the cast becomes red or raw. °· You have new pain or worsening pain after the cast has been applied. °SEEK IMMEDIATE MEDICAL CARE IF:  °· You have fluid leaking through the cast. °· You are unable to move your fingers or toes. °· You have discolored (blue or white), cool, painful, or very swollen fingers or toes beyond the cast. °· You have tingling or numbness around the injured area. °· You have severe pain or pressure under the cast. °· You have any difficulty with your breathing or have shortness of breath. °· You have chest pain. °  °This information is not intended to replace advice given to you by your health care provider. Make sure you discuss any questions you have with your health care   provider. °  °Document Released: 07/26/2000 Document Revised: 05/19/2013 Document Reviewed: 02/04/2013 °Elsevier Interactive Patient Education ©2016 [employer] ° °Forearm Fracture °A forearm fracture is a break in one or both of the bones of your arm that are between the elbow and the wrist. Your forearm is made up of two bones: °· Radius. This is the bone on the inside of your arm near your thumb. °· Ulna. This is the bone on the outside of your arm near your little finger. °Middle forearm fractures usually break both the radius and the ulna. Most forearm fractures that involve both the ulna and radius will require surgery. °CAUSES °Common causes of this type of fracture include: °· Falling on an outstretched arm. °· Accidents, such as a car or bike accident. °· A hard, direct hit to the middle  part of your arm. °RISK FACTORS °You may be at higher risk for this type of fracture if: °· You play contact sports. °· You have a condition that causes your bones to be weak or thin (osteoporosis). °SIGNS AND SYMPTOMS °A forearm fracture causes pain immediately after the injury. Other signs and symptoms include: °· An abnormal bend or bump in your arm (deformity). °· Swelling. °· Numbness or tingling. °· Tenderness. °· Inability to turn your hand from side to side (rotate). °· Bruising. °DIAGNOSIS °Your health care provider may diagnose a forearm fracture based on: °· Your symptoms. °· Your medical history, including any recent injury. °· A physical exam. Your health care provider will look for any deformity and feel for tenderness over the break. Your health care provider will also check whether the bones are out of place. °· An X-ray exam to confirm the diagnosis and learn more about the type of fracture. °TREATMENT °The goals of treatment are to get the bone or bones in proper position for healing and to keep the bones from moving so they will heal over time. Your treatment will depend on many factors, especially the type of fracture that you have. °· If the fractured bone or bones: °¨ Are in the correct position (nondisplaced), you may only need to wear a cast or a splint. °¨ Have a slightly displaced fracture, you may need to have the bones moved back into place manually (closed reduction) before the splint or cast is put on. °· You may have a temporary splint before you have a cast. The splint allows room for some swelling. After a few days, a cast can replace the splint. °· You may have to wear the cast for 6-8 weeks or as directed by your health care provider. °· The cast may be changed after about 3 weeks or as directed by your health care provider. °· After your cast is removed, you may need physical therapy to regain full movement in your wrist or elbow. °· You may need emergency surgery if you  have: °¨ A fractured bone or bones that are out of position (displaced). °¨ A fracture with multiple fragments (comminuted fracture). °¨ A fracture that breaks the skin (open fracture). This type of fracture may require surgical wires, plates, or screws to hold the bone or bones in place. °· You may have X-rays every couple of weeks to check on your healing. °HOME CARE INSTRUCTIONS °If You Have a Cast: °· Do not stick anything inside the cast to scratch your skin. Doing that increases your risk of infection. °· Check the skin around the cast every day. Report any concerns to your health care   provider. You may put lotion on dry skin around the edges of the cast. Do not apply lotion to the skin underneath the cast. °If You Have a Splint: °· Wear it as directed by your health care provider. Remove it only as directed by your health care provider. °· Loosen the splint if your fingers become numb and tingle, or if they turn cold and blue. °Bathing °· Cover the cast or splint with a watertight plastic bag to protect it from water while you bathe or shower. Do not let the cast or splint get wet. °Managing Pain, Stiffness, and Swelling °· If directed, apply ice to the injured area: °¨ Put ice in a plastic bag. °¨ Place a towel between your skin and the bag. °¨ Leave the ice on for 20 minutes, 2-3 times a day. °· Move your fingers often to avoid stiffness and to lessen swelling. °· Raise the injured area above the level of your heart while you are sitting or lying down. °Driving °· Do not drive or operate heavy machinery while taking pain medicine. °· Do not drive while wearing a cast or splint on a hand that you use for driving. °Activity °· Return to your normal activities as directed by your health care provider. Ask your health care provider what activities are safe for you. °· Perform range-of-motion exercises only as directed by your health care provider. °Safety °· Do not use your injured limb to support your body  weight until your health care provider says that you can. °General Instructions °· Do not put pressure on any part of the cast or splint until it is fully hardened. This may take several hours. °· Keep the cast or splint clean and dry. °· Do not use any tobacco products, including cigarettes, chewing tobacco, or electronic cigarettes. Tobacco can delay bone healing. If you need help quitting, ask your health care provider. °· Take medicines only as directed by your health care provider. °· Keep all follow-up visits as directed by your health care provider. This is important. °SEEK MEDICAL CARE IF: °· Your pain medicine is not helping. °· Your cast or splint becomes wet or damaged or suddenly feels too tight. °· Your cast becomes loose. °· You have more severe pain or swelling than you did before the cast. °· You have severe pain when you stretch your fingers. °· You continue to have pain or stiffness in your elbow or your wrist after your cast is removed. °SEEK IMMEDIATE MEDICAL CARE IF: °· You cannot move your fingers. °· You lose feeling in your fingers or your hand. °· Your hand or your fingers turn cold and pale or blue. °· You notice a bad smell coming from your cast. °· You have drainage from underneath your cast. °· You have new stains from blood or drainage that is coming through your cast. °  °This information is not intended to replace advice given to you by your health care provider. Make sure you discuss any questions you have with your health care provider. °  °Document Released: 07/26/2000 Document Revised: 08/19/2014 Document Reviewed: 03/14/2014 °Elsevier Interactive Patient Education ©2016 Elsevier Inc. ° °

## 2015-11-29 NOTE — ED Provider Notes (Signed)
[facility] Emergency Department Provider Note  ____________________________________________  Time seen: Approximately 11:16 PM  I have reviewed the triage vital signs and the nursing notes.   HISTORY  Chief Complaint Hand Injury    HPI Perry [name] is a 82 y.o. male who presents emergency department complaining of right hand pain. Patient states that he was going into a restaurant to use the restroom when he stepped up on the curb. He lost his balance and fell backwards. Patient tried to protect his fall with an outstretched hand. He states that the entire impact was absorbed into his right hand/wrist. Patient did not hit his head or lose consciousness. Patient endorses swelling to the dorsal aspect of the right hand. He denies any abrasions or lacerations.   Past Medical History  Diagnosis Date  . Urinary incontinence   . Hypertension   . Hyperlipidemia   . S/P TURP   . Arthritis   . Heart attack [facility](HCC)     Patient Active Problem List   Diagnosis Date Noted  . Benign hypertensive heart disease without heart failure 12/23/2012  . Dyspnea on exertion 08/25/2012  . Pedal edema 08/25/2012  . Heart murmur 08/25/2012  . Right inguinal hernia 02/28/2011    Past Surgical History  Procedure Laterality Date  . Hernia repair    . Prostate surgery    . Hip fusion      lt  . Tonsillectomy    . Wrist surgery    . Cholecystectomy    . Knee surgery      rt knee    Current Outpatient Rx  Name  Route  Sig  Dispense  Refill  . B Complex Vitamins (B-COMPLEX/B-12 PO)   Oral   Take 1 capsule by mouth daily.          . benazepril (LOTENSIN) 20 MG tablet   Oral   Take 20 mg by mouth 2 (two) times daily.           . calcium-vitamin D (OSCAL WITH D) 500-200 MG-UNIT per tablet   Oral   Take 1 tablet by mouth daily.           . carvedilol (COREG) 12.5 MG tablet   Oral   Take 12.5 mg by mouth 2 (two) times daily with a meal.         .  Cholecalciferol (VITAMIN D) 2000 UNITS CAPS   Oral   Take 2,000 Units by mouth daily.          . Coenzyme Q10 (CO Q-10) 200 MG CAPS   Oral   Take 200 mg by mouth daily.         . fish oil-omega-3 fatty acids 1000 MG capsule   Oral   Take 2 g by mouth daily.           Marland Kitchen. HYDROcodone-acetaminophen (NORCO/VICODIN) 5-325 MG tablet   Oral   Take 1 tablet by mouth every 4 (four) hours as needed for moderate pain.   20 tablet   0   . hydrocortisone 2.5 % cream   Topical   Apply 1 application topically daily as needed. (affected areas)         . magnesium 30 MG tablet   Oral   Take 30 mg by mouth 2 (two) times daily.           . mirabegron ER (MYRBETRIQ) 50 MG TB24   Oral   Take 50 mg by mouth 2 (two) times  daily.         Marland Kitchen OVER THE COUNTER MEDICATION   Oral   Take 1 capsule by mouth 2 (two) times daily. (Juice plus capsules)         . potassium chloride SA (K-DUR,KLOR-CON) 20 MEQ tablet   Oral   Take 20 mEq by mouth 2 (two) times daily.         . psyllium (METAMUCIL) 58.6 % powder   Oral   Take 1 packet by mouth daily.         . tadalafil (CIALIS) 20 MG tablet   Oral   Take 20 mg by mouth daily as needed. (erectile dysfunction)         . timolol (TIMOPTIC) 0.5 % ophthalmic solution   Left Eye   Place 1 drop into the left eye 2 (two) times daily.            Allergies Tetanus toxoids; Atorvastatin; and Rosuvastatin  Family History  Problem Relation Age of Onset  . Healthy Mother   . Hypertension Father   . Ovarian cancer Sister   . Appendicitis Brother   . Hypertension Sister   . Heart disease Sister     Social History Social History  Substance Use Topics  . Smoking status: Former Smoker    Types: Cigars  . Smokeless tobacco: Never Used  . Alcohol Use: No     Review of Systems  Constitutional: No fever/chills Cardiovascular: no chest pain. Respiratory: no cough. No SOB. Musculoskeletal: Positive for right hand pain. Skin:  Negative for rash. Negative for lacerations or abrasions. Neurological: Negative for headaches, focal weakness or numbness. 10-point ROS otherwise negative.  ____________________________________________   PHYSICAL EXAM:  VITAL SIGNS: ED Triage Vitals  Enc Vitals Group     BP 11/29/15 2243 173/65 mmHg     Pulse Rate 11/29/15 2243 57     Resp 11/29/15 2243 20     Temp 11/29/15 2243 98.1 F (36.7 C)     Temp Source 11/29/15 2243 Oral     SpO2 11/29/15 2243 96 %     Weight 11/29/15 2243 173 lb (78.472 kg)     Height 11/29/15 2243  (1.753 m)     Head Cir --      Peak Flow --      Pain Score 11/29/15 2245 8     Pain Loc --      Pain Edu? --      Excl. in GC? --      Constitutional: Alert and oriented. Well appearing and in no acute distress. Eyes: Conjunctivae are normal. PERRL. EOMI. Head: Atraumatic. Neck: No stridor. Neck is supple with full range of motion. No C-spine tenderness to palpation. Cardiovascular: Normal rate, regular rhythm. Normal S1 and S2.  Good peripheral circulation. Respiratory: Normal respiratory effort without tachypnea or retractions. Lungs CTAB. Musculoskeletal: No visible deformity to right wrist or hand upon inspection. Gross edema is noted to the dorsal aspect of the right hand. Patient has limited range of motion due to pain. He is able to move all 5 digits. Sensation and cap refill intact 5 days. Radial pulse intact. Patient is diffusely tender to palpation over the carpal bones and distal radius and ulna. No palpable abnormality. Neurologic:  Normal speech and language. No gross focal neurologic deficits are appreciated.  Skin:  Skin is warm, dry and intact. No rash noted. Psychiatric: Mood and affect are normal. Speech and behavior are normal. Patient exhibits appropriate insight and  judgement.   ____________________________________________   LABS (all labs ordered are listed, but only abnormal results are displayed)  Labs Reviewed - No  data to display ____________________________________________  EKG   ____________________________________________  RADIOLOGY Festus Barren Jovon Streetman, personally viewed and evaluated these images (plain radiographs) as part of my medical decision making, as well as reviewing the written report by the radiologist.  Dg Hand Complete Right  11/29/2015  CLINICAL DATA:  Pain and swelling to the right hand.  Fall tonight. EXAM: RIGHT HAND - COMPLETE 3+ VIEW COMPARISON:  None. FINDINGS: Degenerative changes throughout the interphalangeal, metacarpophalangeal, radiocarpal, and first carpometacarpal joints. Calcification in the triangular fibrocartilage. Prominent dorsal soft tissue swelling over the metacarpal region. Metacarpal bones appear intact. There is linear lucency along the medial aspect of the distal radius extending to the radiocarpal articular surface, suggesting a nondisplaced fracture. IMPRESSION: Nondisplaced fracture of the distal right radius extending to the radiocarpal joint. Prominent soft tissue swelling over the dorsum of the right hand. Diffuse degenerative changes. Electronically Signed   By: Burman Nieves M.D.   On: 11/29/2015 23:13    ____________________________________________    PROCEDURES  Procedure(s) performed:   SPLINT APPLICATION Date/Time: 11:49 PM Authorized by: Racheal Patches Consent: Verbal consent obtained. Risks and benefits: risks, benefits and alternatives were discussed Consent given by: patient Splint applied by: ED Tech Location details: Right wrist  Splint type: Volar wrist splint  Supplies used: Ortho-Glass  Post-procedure: The splinted body part was neurovascularly unchanged following the procedure. Patient tolerance: Patient tolerated the procedure well with no immediate complications.       Medications - No data to display   ____________________________________________   INITIAL IMPRESSION / ASSESSMENT AND PLAN / ED  COURSE  Pertinent labs & imaging results that were available during my care of the patient were reviewed by me and considered in my medical decision making (see chart for details).  Patient's diagnosis is consistent with distal radius fracture. X-ray reveals a nondisplaced distal radius fracture. Patient's wrist is splinted here in the emergency department. The rest the patient's exam is reassuring. No further imaging or tests are needed.. Patient will be discharged home with prescriptions for pain medication. Patient is to follow up with orthopedics. Patient is given ED precautions to return to the ED for any worsening or new symptoms.     ____________________________________________  FINAL CLINICAL IMPRESSION(S) / ED DIAGNOSES  Final diagnoses:  Distal radius fracture, right, closed, initial encounter      NEW MEDICATIONS STARTED DURING THIS VISIT:  New Prescriptions   HYDROCODONE-ACETAMINOPHEN (NORCO/VICODIN) 5-325 MG TABLET    Take 1 tablet by mouth every 4 (four) hours as needed for moderate pain.        This chart was dictated using voice recognition software/Dragon. Despite best efforts to proofread, errors can occur which can change the meaning. Any change was purely unintentional.    Racheal Patches, PA-C 11/29/15 2349  Richardean Canal, MD 11/30/15 1640

## 2015-11-29 NOTE — ED Notes (Addendum)
Pt to triage via wheelchair.  Pt has pain and swelling to right hand.  Pt was stepping up onto a curb, lost balance and fell backwards and put right hand down when falling.  No loc.  Pt alert.  Hx parkinsons

## 2015-11-30 ENCOUNTER — Ambulatory Visit: Payer: Medicare Other | Admitting: Physical Therapy

## 2015-12-05 ENCOUNTER — Ambulatory Visit: Payer: Medicare Other | Admitting: Physical Therapy

## 2015-12-07 ENCOUNTER — Encounter: Payer: Self-pay | Admitting: Physical Therapy

## 2015-12-07 ENCOUNTER — Ambulatory Visit: Payer: Medicare Other | Admitting: Physical Therapy

## 2015-12-07 DIAGNOSIS — M6281 Muscle weakness (generalized): Secondary | ICD-10-CM

## 2015-12-07 DIAGNOSIS — R262 Difficulty in walking, not elsewhere classified: Secondary | ICD-10-CM | POA: Diagnosis not present

## 2015-12-07 NOTE — Therapy (Signed)
[facility] [address] [place], [place], [address] Phone: [phone]   Fax:  [phone]  Physical Therapy Treatment  Patient Details  Name: Perry [name] MRN: [id number] Date of Birth: 10/21/1928 Referring Provider: Dr. Clelia CroftShaw  Encounter Date: 12/07/2015      PT End of Session - 12/07/15 1620    Visit Number 5   Number of Visits 25   Date for PT Re-Evaluation 01/31/16   Authorization Type g codes   PT Start Time 0320   PT Stop Time 0415   PT Time Calculation (min) 55 min   Equipment Utilized During Treatment Gait belt   Activity Tolerance Patient tolerated treatment well      Past Medical History  Diagnosis Date  . Urinary incontinence   . Hypertension   . Hyperlipidemia   . S/P TURP   . Arthritis   . Heart attack [facility](HCC)     Past Surgical History  Procedure Laterality Date  . Hernia repair    . Prostate surgery    . Hip fusion      lt  . Tonsillectomy    . Wrist surgery    . Cholecystectomy    . Knee surgery      rt knee    There were no vitals filed for this visit.      Subjective Assessment - 12/07/15 1619    Subjective Pt  had a fall and broke his left wrist. He is in a soft cast and sling.    Pertinent History airtuc riit dukatatuibm airtuc sckerisus, CAD, depression wiht anxiety, diverticulosis, hyperlipidemia, HTN, Hyperthyroidism, leg edama, OA, palpatiions, BLE hip replacement, R TKR,    Currently in Pain? Yes   Pain Score 3    Pain Location Wrist   Pain Orientation Left     Gait training with multiple devices including spc, loftstrand crutches and hemiwalker with CGA. Patient was recommended to use a loftstrand crutch for best safety. Therapeutic exercise: Seated LAQ, hip flex, hip abd/ER with RTB x 10 x 2 Standing hip abd, ext and flex with RTB x 10 x 2 Patient needs cuing for using AD and taking longer steps.                                 PT Long  Term Goals - 11/08/15 1343    PT LONG TERM GOAL #1   Title Patient will be independent in home exercise program to improve strength/mobility for better functional independence with ADLs   Time 12   Period Weeks   Status New   PT LONG TERM GOAL #2   Title Patient will increase six minute walk test distance to >1000 for progression to community ambulator and improve gait ability   Time 12   Period Weeks   Status New   PT LONG TERM GOAL #3   Title Patient will increase 10 meter walk test to >1.120m/s as to improve gait speed for better community ambulation and to reduce fall risk   Time 12   Period Weeks   Status New   PT LONG TERM GOAL #4   Title Patient will reduce timed up and go to <11 seconds to reduce fall risk and demonstrate improved transfer/gait ability.   Time 12   Period Weeks   Status New               Plan -  12/07/15 1621    Clinical Impression Statement Patient was instructed in gait training wiht loft strand crutch, hemiwalker, and spc . He has the most stabiity with the loftstrand crutch. He was able to participate in LE standing and seated exercises with TB.    Rehab Potential Good   Clinical Impairments Affecting Rehab Potential patients clinical presentation is stable, he has 13 steps to go down to the basement to use his stationary bike,    PT Frequency 2x / week   PT Duration 12 weeks   PT Treatment/Interventions Therapeutic exercise;Balance training;Therapeutic activities;Gait training;Stair training   PT Next Visit Plan strengthening and balance   PT Home Exercise Plan supine therex given 4/12   Consulted and Agree with Plan of Care Patient      Patient will benefit from skilled therapeutic intervention in order to improve the following deficits and impairments:  Abnormal gait, Decreased activity tolerance, Decreased balance, Difficulty walking, Decreased strength, Impaired flexibility, Pain, Decreased mobility, Decreased endurance  Visit  Diagnosis: Difficulty in walking, not elsewhere classified  Muscle weakness (generalized)     Problem List Patient Active Problem List   Diagnosis Date Noted  . Benign hypertensive heart disease without heart failure 12/23/2012  . Dyspnea on exertion 08/25/2012  . Pedal edema 08/25/2012  . Heart murmur 08/25/2012  . Right inguinal hernia 02/28/2011   [name], PT, DPT [place], Barkley Bruns S 12/07/2015, 4:24 PM  [place] MAIN [facility] SERVICES [address] [place], [place], [address] Phone: [phone]   Fax:  [phone]  Name: Perry [name] MRN: [id number] Date of Birth: 1929/08/12

## 2015-12-07 NOTE — Patient Instructions (Addendum)
Balance, Proprioception: Hip Abduction With Tubing   With tubing attached to both ankles, Standing holding onto counter, kick one leg out to side and then Return.  Repeat _10___ times  On each side.  Do ___2_ sessions per day.  http://cc.exer.us/20   .  Balance, Proprioception: Hip Extension With Tubing   With tubing tied around both legs, holding onto kitchen counter, swing leg back. Return. Repeat _10___ times . Do __2__ sessions per day.  http://cc.exer.us/19   Copyright  VHI. All rights reserved.     Knee Extension: Resisted (Sitting)   With band looped around right ankle and under other foot, straighten leg with ankle loop. Keep other leg bent to increase resistance. Repeat _10___ times per set. Do __2__ sets per session. Do _2___ sessions per day.  http://orth.exer.us/691   Copyright  VHI. All rights reserved.  ABDUCTION: Sitting - Exercise Ball: Resistance Band (Active)   Sit with feet flat. With band tied around both legs, Lift right leg slightly and, against resistance band, draw it out to side. Complete __2_ sets of __10_ repetitions. Perform _2__ sessions per day.  Copyright  VHI. All rights reserved.  FLEXION: Sitting - Resistance Band (Active)   Sit, both feet flat. Have band tied around both legs above knees, lift right knee toward ceiling.Repeat with other knee Complete _2__ sets of _10__ repetitions. Perform _2__ sessions per day.  http://gtsc.exer.us/21   Copyright  VHI. All rights reserved.  HIP / KNEE: Extension - Sit to Stand   Sitting, lean chest forward, raise hips up from surface. Straighten hips and knees. Weight bear equally on left and right sides. Backs of legs should not push off surface. __10_ reps per set, __2_ sets per day, _5__ days per week Use assistive device as needed.  Copyright  VHI. All rights reserved.  FLEXION: Sitting - Resistance Band (Active)   Sit with right foot flat. Have band tied around both feet, bend ankle,  bringing toes toward head. Complete __2_ sets of __10_ repetitions. Perform _2__ sessions per day.  Copyright  VHI. All rights reserved.  Toe / Heel Raise (Sitting)   Sitting, raise heels, then rock back on heels and raise toes. Repeat _10___ times.  Copyright  VHI. All rights reserved.

## 2015-12-11 ENCOUNTER — Ambulatory Visit: Payer: Medicare Other | Attending: Neurology | Admitting: Physical Therapy

## 2015-12-11 ENCOUNTER — Encounter: Payer: Self-pay | Admitting: Physical Therapy

## 2015-12-11 DIAGNOSIS — R262 Difficulty in walking, not elsewhere classified: Secondary | ICD-10-CM

## 2015-12-11 DIAGNOSIS — M6281 Muscle weakness (generalized): Secondary | ICD-10-CM | POA: Diagnosis present

## 2015-12-11 NOTE — Therapy (Signed)
[place] [facility] MAIN [facility] SERVICES [address] [place], [place], [address] Phone: [phone]   Fax:  [phone]  Physical Therapy Treatment  Patient Details  Name: Perry [name] MRN: [id number] Date of Birth: 08-Feb-1929 Referring Provider: Dr. Clelia Croft  Encounter Date: 12/11/2015      PT End of Session - 12/11/15 1535    Visit Number 6   Number of Visits 25   Date for PT Re-Evaluation Feb 02, 2016   Authorization Type g codes   PT Start Time 0230   PT Stop Time 0315   PT Time Calculation (min) 45 min   Equipment Utilized During Treatment Gait belt   Activity Tolerance Patient tolerated treatment well      Past Medical History  Diagnosis Date  . Urinary incontinence   . Hypertension   . Hyperlipidemia   . S/P TURP   . Arthritis   . Heart attack [facility])     Past Surgical History  Procedure Laterality Date  . Hernia repair    . Prostate surgery    . Hip fusion      lt  . Tonsillectomy    . Wrist surgery    . Cholecystectomy    . Knee surgery      rt knee    There were no vitals filed for this visit.      Subjective Assessment - 12/11/15 1534    Subjective Pt  had a fall and broke his left wrist. He is in a soft cast and sling. he is having trouble with getting his ted hose socks on and brought the socks to learn how.    Pertinent History airtuc riit dukatatuibm airtuc sckerisus, CAD, depression wiht anxiety, diverticulosis, hyperlipidemia, HTN, Hyperthyroidism, leg edama, OA, palpatiions, BLE hip replacement, R TKR,      Gait training with loftstrand crutches 400 feet x 3 Dynamic standing and static standing balance training with small base of support and head turns, tandem stands with small base of support. 4 way hip flex/ extension/ hip flex x 20 BLE Patient needs occasional verbal cueing to improve posture and cueing to correctly perform exercises slowly, holding at end of range to increase motor firing of desired muscle to  encourage fatigue.                              PT Education - 12/11/15 1535    Education provided Yes   Education Details HEP   Person(s) Educated Patient   Methods Explanation   Comprehension Verbalized understanding             PT Long Term Goals - 11/08/15 1343    PT LONG TERM GOAL #1   Title Patient will be independent in home exercise program to improve strength/mobility for better functional independence with ADLs   Time 12   Period Weeks   Status New   PT LONG TERM GOAL #2   Title Patient will increase six minute walk test distance to >1000 for progression to community ambulator and improve gait ability   Time 12   Period Weeks   Status New   PT LONG TERM GOAL #3   Title Patient will increase 10 meter walk test to >1.57m/s as to improve gait speed for better community ambulation and to reduce fall risk   Time 12   Period Weeks   Status New   PT LONG TERM GOAL #4   Title Patient  will reduce timed up and go to <11 seconds to reduce fall risk and demonstrate improved transfer/gait ability.   Time 12   Period Weeks   Status New               Plan - 12/11/15 1537    Clinical Impression Statement Patient was educated and instructed in gait training with loft strand crutches 400 feet x 3. Patient has decreased dynamic and static standing balance and weakness in BLE.    Rehab Potential Good   Clinical Impairments Affecting Rehab Potential patients clinical presentation is stable, he has 13 steps to go down to the basement to use his stationary bike,    PT Frequency 2x / week   PT Duration 12 weeks   PT Treatment/Interventions Therapeutic exercise;Balance training;Therapeutic activities;Gait training;Stair training   PT Next Visit Plan strengthening and balance   PT Home Exercise Plan supine therex given 4/12   Consulted and Agree with Plan of Care Patient      Patient will benefit from skilled therapeutic intervention in order to  improve the following deficits and impairments:  Abnormal gait, Decreased activity tolerance, Decreased balance, Difficulty walking, Decreased strength, Impaired flexibility, Pain, Decreased mobility, Decreased endurance  Visit Diagnosis: Difficulty in walking, not elsewhere classified  Muscle weakness (generalized)     Problem List Patient Active Problem List   Diagnosis Date Noted  . Benign hypertensive heart disease without heart failure 12/23/2012  . Dyspnea on exertion 08/25/2012  . Pedal edema 08/25/2012  . Heart murmur 08/25/2012  . Right inguinal hernia 02/28/2011  Ezekiel InaKristine S Malyia Moro, PT, DPT  [place], Bark[place] BrunsKristine S 12/11/2015, 3:39 PM  [place] [facility] [address] [place], [place], [address] Phone: [phone]   Fax:  [phone]  Name: Pierre BaliLacy R Willden MRN: [id number] Date of Birth: 08/18/1928

## 2015-12-13 ENCOUNTER — Ambulatory Visit: Payer: Medicare Other | Admitting: Physical Therapy

## 2015-12-13 ENCOUNTER — Encounter: Payer: Self-pay | Admitting: Physical Therapy

## 2015-12-13 DIAGNOSIS — R262 Difficulty in walking, not elsewhere classified: Secondary | ICD-10-CM

## 2015-12-13 DIAGNOSIS — M6281 Muscle weakness (generalized): Secondary | ICD-10-CM

## 2015-12-13 NOTE — Therapy (Signed)
[place] [facility] [address] [place], [place], [address] Phone: [phone]   Fax:  [phone]  Physical Therapy Treatment  Patient Details  Name: Perry [name] MRN: [id number] Date of Birth: 07/07/1929 Referring Provider: Dr. Clelia CroftShaw  Encounter Date: 12/13/2015      PT End of Session - 12/13/15 1515    Visit Number 7   Number of Visits 25   Date for PT Re-Evaluation 01/31/16   Authorization Type g codes   PT Start Time 0230   PT Stop Time 0315   PT Time Calculation (min) 45 min   Equipment Utilized During Treatment Gait belt   Activity Tolerance Patient tolerated treatment well      Past Medical History  Diagnosis Date  . Urinary incontinence   . Hypertension   . Hyperlipidemia   . S/P TURP   . Arthritis   . Heart attack [facility])     Past Surgical History  Procedure Laterality Date  . Hernia repair    . Prostate surgery    . Hip fusion      lt  . Tonsillectomy    . Wrist surgery    . Cholecystectomy    . Knee surgery      rt knee    There were no vitals filed for this visit.      Subjective Assessment - 12/13/15 1514    Subjective Patient is having a difficult time with  mobiity now that he is injured his R wrist.    Pertinent History airtuc riit dukatatuibm airtuc sckerisus, CAD, depression wiht anxiety, diverticulosis, hyperlipidemia, HTN, Hyperthyroidism, leg edama, OA, palpatiions, BLE hip replacement, R TKR,        Gait training with loft strand crutches 500 feet x 2 Sit to stand x 15 x 3 sets Standing hip abd/ext/flex x 15 x 2 Patient needs occasional verbal cueing to improve posture and cueing to correctly perform exercises slowly, holding at end of range to increase motor firing of desired muscle to encourage fatigue.                           PT Education - 12/13/15 1514    Education provided Yes   Education Details HEP   Person(s) Educated Patient   Methods  Explanation   Comprehension Verbalized understanding             PT Long Term Goals - 11/08/15 1343    PT LONG TERM GOAL #1   Title Patient will be independent in home exercise program to improve strength/mobility for better functional independence with ADLs   Time 12   Period Weeks   Status New   PT LONG TERM GOAL #2   Title Patient will increase six minute walk test distance to >1000 for progression to community ambulator and improve gait ability   Time 12   Period Weeks   Status New   PT LONG TERM GOAL #3   Title Patient will increase 10 meter walk test to >1.4723m/s as to improve gait speed for better community ambulation and to reduce fall risk   Time 12   Period Weeks   Status New   PT LONG TERM GOAL #4   Title Patient will reduce timed up and go to <11 seconds to reduce fall risk and demonstrate improved transfer/gait ability.   Time 12   Period Weeks   Status New  Plan - 12/13/15 1515    Clinical Impression Statement Patient performed ambulation with loft strand crutches and strengthening of LE's.   Rehab Potential Good   Clinical Impairments Affecting Rehab Potential patients clinical presentation is stable, he has 13 steps to go down to the basement to use his stationary bike,    PT Frequency 2x / week   PT Duration 12 weeks   PT Treatment/Interventions Therapeutic exercise;Balance training;Therapeutic activities;Gait training;Stair training   PT Next Visit Plan strengthening and balance   PT Home Exercise Plan supine therex given 4/12   Consulted and Agree with Plan of Care Patient      Patient will benefit from skilled therapeutic intervention in order to improve the following deficits and impairments:  Abnormal gait, Decreased activity tolerance, Decreased balance, Difficulty walking, Decreased strength, Impaired flexibility, Pain, Decreased mobility, Decreased endurance  Visit Diagnosis: Difficulty in walking, not elsewhere  classified  Muscle weakness (generalized)     Problem List Patient Active Problem List   Diagnosis Date Noted  . Benign hypertensive heart disease without heart failure 12/23/2012  . Dyspnea on exertion 08/25/2012  . Pedal edema 08/25/2012  . Heart murmur 08/25/2012  . Right inguinal hernia 02/28/2011   [name], PT, DPT [place], [place] 12/13/2015, 3:20 PM  [facility] MAIN [facility] SERVICES [address] [place], [place], [address] Phone: [phone]   Fax:  [phone]  Name: Perry [name] MRN: [id number] Date of Birth: 02/08/1929

## 2015-12-18 ENCOUNTER — Ambulatory Visit: Payer: Medicare Other | Admitting: Physical Therapy

## 2015-12-18 ENCOUNTER — Encounter: Payer: Self-pay | Admitting: Physical Therapy

## 2015-12-18 DIAGNOSIS — R262 Difficulty in walking, not elsewhere classified: Secondary | ICD-10-CM

## 2015-12-18 DIAGNOSIS — M6281 Muscle weakness (generalized): Secondary | ICD-10-CM

## 2015-12-18 NOTE — Therapy (Signed)
[place] [facility] MAIN [facility] SERVICES [address] [place], [place], [address] Phone: [phone]   Fax:  [phone]  Physical Therapy Treatment  Patient Details  Name: Perry [name] MRN: [id number] Date of Birth: Jan 12, 1929 Referring Provider: Dr. Clelia Croft  Encounter Date: 12/18/2015      PT End of Session - 12/18/15 1445    Visit Number 8   Number of Visits 25   Date for PT Re-Evaluation 06-Feb-2016   Authorization Type g codes   PT Start Time 0235   PT Stop Time 0315   PT Time Calculation (min) 40 min   Equipment Utilized During Treatment Gait belt   Activity Tolerance Patient tolerated treatment well      Past Medical History  Diagnosis Date  . Urinary incontinence   . Hypertension   . Hyperlipidemia   . S/P TURP   . Arthritis   . Heart attack [facility])     Past Surgical History  Procedure Laterality Date  . Hernia repair    . Prostate surgery    . Hip fusion      lt  . Tonsillectomy    . Wrist surgery    . Cholecystectomy    . Knee surgery      rt knee    There were no vitals filed for this visit.      Subjective Assessment - 12/18/15 1444    Subjective Patient is having a difficult time with  mobiity now that he is injured his R wrist.    Pertinent History airtuc riit dukatatuibm airtuc sckerisus, CAD, depression wiht anxiety, diverticulosis, hyperlipidemia, HTN, Hyperthyroidism, leg edama, OA, palpatiions, BLE hip replacement, R TKR,    Currently in Pain? No/denies      standing hip abd with YTB x 20  side stepping left and right in parallel bars 10 feet x 3 step ups from floor to 6 inch stool x 20 bilateral sit to stand x 10 marching in parallel bars x 20 stepping pattern with weight shifting fwd/bwd x 10.  TM walking with decreased step length and height x 3 minutes x 3 Patient needs max  verbal cueing to improve posture and cueing to correctly perform exercises slowly, holding at end of range to increase motor firing  of desired muscle to encourage fatigue.                            PT Education - 12/18/15 1444    Education provided Yes   Education Details safety with transfers   Person(s) Educated Patient   Methods Explanation   Comprehension Verbalized understanding             PT Long Term Goals - 11/08/15 1343    PT LONG TERM GOAL #1   Title Patient will be independent in home exercise program to improve strength/mobility for better functional independence with ADLs   Time 12   Period Weeks   Status New   PT LONG TERM GOAL #2   Title Patient will increase six minute walk test distance to >1000 for progression to community ambulator and improve gait ability   Time 12   Period Weeks   Status New   PT LONG TERM GOAL #3   Title Patient will increase 10 meter walk test to >1.92m/s as to improve gait speed for better community ambulation and to reduce fall risk   Time 12   Period Weeks   Status  New   PT LONG TERM GOAL #4   Title Patient will reduce timed up and go to <11 seconds to reduce fall risk and demonstrate improved transfer/gait ability.   Time 12   Period Weeks   Status New               Plan - 12/18/15 1445    Clinical Impression Statement Tactile cues to keep upright posture during hip extension in standing.   Rehab Potential Good   Clinical Impairments Affecting Rehab Potential patients clinical presentation is stable, he has 13 steps to go down to the basement to use his stationary bike,    PT Frequency 2x / week   PT Duration 12 weeks   PT Treatment/Interventions Therapeutic exercise;Balance training;Therapeutic activities;Gait training;Stair training   PT Next Visit Plan strengthening and balance   PT Home Exercise Plan supine therex given 4/12   Consulted and Agree with Plan of Care Patient      Patient will benefit from skilled therapeutic intervention in order to improve the following deficits and impairments:  Abnormal gait,  Decreased activity tolerance, Decreased balance, Difficulty walking, Decreased strength, Impaired flexibility, Pain, Decreased mobility, Decreased endurance  Visit Diagnosis: Difficulty in walking, not elsewhere classified  Muscle weakness (generalized)     Problem List Patient Active Problem List   Diagnosis Date Noted  . Benign hypertensive heart disease without heart failure 12/23/2012  . Dyspnea on exertion 08/25/2012  . Pedal edema 08/25/2012  . Heart murmur 08/25/2012  . Right inguinal hernia 02/28/2011   Perry [name], PT, DPT [place], [place] BrunsKristine S 12/18/2015, 2:47 PM  [place] [facility] [address] [place], [place], [address] Phone: [phone]   Fax:  [phone]  Name: Perry [name] MRN: [id number] Date of Birth: 07/15/1929

## 2015-12-20 ENCOUNTER — Encounter: Payer: Self-pay | Admitting: Physical Therapy

## 2015-12-20 ENCOUNTER — Ambulatory Visit: Payer: Medicare Other | Admitting: Physical Therapy

## 2015-12-20 DIAGNOSIS — M6281 Muscle weakness (generalized): Secondary | ICD-10-CM

## 2015-12-20 DIAGNOSIS — R262 Difficulty in walking, not elsewhere classified: Secondary | ICD-10-CM | POA: Diagnosis not present

## 2015-12-20 NOTE — Therapy (Signed)
[place] [facility] MAIN [facility] SERVICES [address] [place], [place], [address] Phone: [phone]   Fax:  [phone]  Physical Therapy Treatment  Patient Details  Name: Perry [name] MRN: [id number] Date of Birth: [birth year] Referring Provider: Dr. Clelia Croft  Encounter Date: 12/20/2015      PT End of Session - 12/20/15 1509    Visit Number 9   Number of Visits 25   Authorization Type g codes   PT Start Time 0230   PT Stop Time 0315   PT Time Calculation (min) 45 min   Equipment Utilized During Treatment Gait belt      Past Medical History  Diagnosis Date  . Urinary incontinence   . Hypertension   . Hyperlipidemia   . S/P TURP   . Arthritis   . Heart attack [facility])     Past Surgical History  Procedure Laterality Date  . Hernia repair    . Prostate surgery    . Hip fusion      lt  . Tonsillectomy    . Wrist surgery    . Cholecystectomy    . Knee surgery      rt knee    There were no vitals filed for this visit.      Subjective Assessment - 12/20/15 1508    Subjective Patient is having a difficult time with  mobiity now that he is injured his R wrist.    Pertinent History airtuc riit dukatatuibm airtuc sckerisus, CAD, depression wiht anxiety, diverticulosis, hyperlipidemia, HTN, Hyperthyroidism, leg edama, OA, palpatiions, BLE hip replacement, R TKR,    Currently in Pain? No/denies        standing hip abd with YTB x 20  side stepping left and right in parallel bars 10 feet x 3 standing on blue foam with cone reaching x 20 across midline step ups from floor to 6 inch stool x 20 bilateral sit to stand x 10 marching in parallel bars x 20 TM walking with UE support . 5 miles / hour x 2 mins, 3 mins, 3 mins Leg press x 20 x 3 90 lbs, heel raises x 20 x 3 Min cueing needed to appropriately perform  tasks with leg, hand, and head position. . Patient continues to demonstrate some in coordination of movement with select exercises.  Patient responds well to verbal and tactile cues to correct form and technique.  CGA to SBA for safety with activities.                           PT Education - 12/20/15 1508    Education provided Yes   Education Details safety with transfers and with loftstrand curches   Person(s) Educated Patient   Methods Explanation   Comprehension Verbalized understanding             PT Long Term Goals - 11/08/15 1343    PT LONG TERM GOAL #1   Title Patient will be independent in home exercise program to improve strength/mobility for better functional independence with ADLs   Time 12   Period Weeks   Status New   PT LONG TERM GOAL #2   Title Patient will increase six minute walk test distance to >1000 for progression to community ambulator and improve gait ability   Time 12   Period Weeks   Status New   PT LONG TERM GOAL #3   Title Patient will increase 10 meter walk test  to >1.5464m/s as to improve gait speed for better community ambulation and to reduce fall risk   Time 12   Period Weeks   Status New   PT LONG TERM GOAL #4   Title Patient will reduce timed up and go to <11 seconds to reduce fall risk and demonstrate improved transfer/gait ability.   Time 12   Period Weeks   Status New               Plan - 12/20/15 1510    Clinical Impression Statement Pt. demonstrated good control and understanding with standing hip 3-way. Minimal cueing needed to point toe forward and keep knee straight with task. SBA needed throughout.    Rehab Potential Good   Clinical Impairments Affecting Rehab Potential patients clinical presentation is stable, he has 13 steps to go down to the basement to use his stationary bike,    PT Frequency 2x / week   PT Duration 12 weeks   PT Treatment/Interventions Therapeutic exercise;Balance training;Therapeutic activities;Gait training;Stair training   PT Next Visit Plan strengthening and balance   PT Home Exercise Plan supine therex  given 4/12   Consulted and Agree with Plan of Care Patient      Patient will benefit from skilled therapeutic intervention in order to improve the following deficits and impairments:  Abnormal gait, Decreased activity tolerance, Decreased balance, Difficulty walking, Decreased strength, Impaired flexibility, Pain, Decreased mobility, Decreased endurance  Visit Diagnosis: Difficulty in walking, not elsewhere classified  Muscle weakness (generalized)     Problem List Patient Active Problem List   Diagnosis Date Noted  . Benign hypertensive heart disease without heart failure 12/23/2012  . Dyspnea on exertion 08/25/2012  . Pedal edema 08/25/2012  . Heart murmur 08/25/2012  . Right inguinal hernia 02/28/2011   Ezekiel InaKristine S [place], PT, DPT [place], Bark[place] BrunsKristine S 12/20/2015, 3:12 PM  [place] [facility] [address] [place], [place], [address] Phone: [phone]   Fax:  [phone]  Name: Perry [name] MRN: [id number] Date of Birth: 04/02/1929

## 2015-12-25 ENCOUNTER — Ambulatory Visit: Payer: Medicare Other | Admitting: Physical Therapy

## 2015-12-25 ENCOUNTER — Encounter: Payer: Self-pay | Admitting: Physical Therapy

## 2015-12-25 DIAGNOSIS — R262 Difficulty in walking, not elsewhere classified: Secondary | ICD-10-CM | POA: Diagnosis not present

## 2015-12-25 DIAGNOSIS — M6281 Muscle weakness (generalized): Secondary | ICD-10-CM

## 2015-12-25 NOTE — Therapy (Signed)
[place] MAIN [facility] SERVICES [address] [place], [place], [address] Phone: [phone]   Fax:  [phone]  Physical Therapy Treatment  Patient Details  Name: Perry [name] MRN: [id number] Date of Birth: 1928/11/11 Referring Provider: Dr. Brigitte Pulse  Encounter Date: 12/25/2015      PT End of Session - 12/25/15 1441    Visit Number 10   Number of Visits 25   Authorization Type g codes   PT Start Time 0230   PT Stop Time 0315   PT Time Calculation (min) 45 min   Equipment Utilized During Treatment Gait belt      Past Medical History  Diagnosis Date  . Urinary incontinence   . Hypertension   . Hyperlipidemia   . S/P TURP   . Arthritis   . Heart attack [facility])     Past Surgical History  Procedure Laterality Date  . Hernia repair    . Prostate surgery    . Hip fusion      lt  . Tonsillectomy    . Wrist surgery    . Cholecystectomy    . Knee surgery      rt knee    There were no vitals filed for this visit.      Subjective Assessment - 12/25/15 1440    Subjective Patient is having a difficult time with  mobiity now that he is injured his R wrist.    Pertinent History airtuc riit dukatatuibm airtuc sckerisus, CAD, depression wiht anxiety, diverticulosis, hyperlipidemia, HTN, Hyperthyroidism, leg edama, OA, palpatiions, BLE hip replacement, R TKR,    Currently in Pain? No/denies       Therapeutic activities: Out come measures: TUG = 23.93 sec 5 x sit to stand  19.40 sec 10 MW=  . 56 m/sec  Gait training with loft strand crutch x 1000 feet with 1 seated rest period  standing hip abd with YTB x 20  side stepping left and right in parallel bars 10 feet x 3 step ups from floor to 6 inch stool x 20 bilateral sit to stand x 10 marching in parallel bars x 20 Patient needs occasional verbal cueing to improve posture and cueing to correctly perform exercises slowly, holding at end of range to increase motor firing of desired  muscle to encourage fatigue.                           PT Education - 12/25/15 1440    Education provided Yes   Education Details safety with walking with loftstrand crutches             PT Long Term Goals - 12/25/15 1450    PT LONG TERM GOAL #1   Title Patient will be independent in home exercise program to improve strength/mobility for better functional independence with ADLs   Time 8   Period Weeks   Status Partially Met   PT LONG TERM GOAL #2   Title Patient will increase six minute walk test distance to >1000 for progression to community ambulator and improve gait ability   Time 8   Period Weeks   Status Partially Met   PT LONG TERM GOAL #3   Title Patient will increase 10 meter walk test to >1.18ms as to improve gait speed for better community ambulation and to reduce fall risk   Time 8   Period Weeks   Status Partially Met   PT LONG TERM GOAL #  4   Title Patient will reduce timed up and go to <11 seconds to reduce fall risk and demonstrate improved transfer/gait ability.   Time 8   Period Weeks   Status Partially Met               Plan - 27-Dec-2015 1441    Clinical Impression Statement Patient has made progress towards goals with better outcome measures.    Rehab Potential Good   Clinical Impairments Affecting Rehab Potential patients clinical presentation is stable, he has 13 steps to go down to the basement to use his stationary bike,    PT Frequency 2x / week   PT Duration 12 weeks   PT Treatment/Interventions Therapeutic exercise;Balance training;Therapeutic activities;Gait training;Stair training   PT Next Visit Plan strengthening and balance   PT Home Exercise Plan supine therex given 4/12   Consulted and Agree with Plan of Care Patient      Patient will benefit from skilled therapeutic intervention in order to improve the following deficits and impairments:  Abnormal gait, Decreased activity tolerance, Decreased balance,  Difficulty walking, Decreased strength, Impaired flexibility, Pain, Decreased mobility, Decreased endurance  Visit Diagnosis: Difficulty in walking, not elsewhere classified  Muscle weakness (generalized)       G-Codes - 12-27-2015 1451    Functional Assessment Tool Used TUG, 10 MW, 6 MW, 5 x sit to  stand   Functional Limitation Mobility: Walking and moving around   Mobility: Walking and Moving Around Current Status [phone]) At least 60 percent but less than 80 percent impaired, limited or restricted   Mobility: Walking and Moving Around Goal Status [phone]) At least 40 percent but less than 60 percent impaired, limited or restricted      Problem List Patient Active Problem List   Diagnosis Date Noted  . Benign hypertensive heart disease without heart failure 12/23/2012  . Dyspnea on exertion 08/25/2012  . Pedal edema 08/25/2012  . Heart murmur 08/25/2012  . Right inguinal hernia 02/28/2011  Alanson Puls, PT, DPT  [place], [place] Headland S Dec 27, 2015, 2:51 PM  [place] MAIN [facility] SERVICES [address] [place], [place], [address] Phone: [phone]   Fax:  [phone]  Name: Perry [name] MRN: [id number] Date of Birth: 09-Mar-1929

## 2015-12-27 ENCOUNTER — Ambulatory Visit: Payer: Medicare Other | Admitting: Physical Therapy

## 2015-12-28 ENCOUNTER — Encounter: Payer: Self-pay | Admitting: Physical Therapy

## 2015-12-28 ENCOUNTER — Ambulatory Visit: Payer: Medicare Other | Admitting: Physical Therapy

## 2015-12-28 DIAGNOSIS — M6281 Muscle weakness (generalized): Secondary | ICD-10-CM

## 2015-12-28 DIAGNOSIS — R262 Difficulty in walking, not elsewhere classified: Secondary | ICD-10-CM | POA: Diagnosis not present

## 2015-12-28 NOTE — Therapy (Signed)
[place] MAIN [facility] SERVICES [address] [place], [place], [address] Phone: [phone]   Fax:  [phone]  Physical Therapy Treatment  Patient Details  Name: SIDNEY KANN MRN: [id number] Date of Birth: 02-Jul-1929 Referring Provider: Dr. Brigitte Pulse  Encounter Date: 12/28/2015      PT End of Session - 12/28/15 1439    Visit Number 11   Number of Visits 25   Authorization Type g codes   PT Start Time 0230   PT Stop Time 0310   PT Time Calculation (min) 40 min   Equipment Utilized During Treatment Gait belt   Activity Tolerance Patient limited by fatigue;Patient limited by pain      Past Medical History  Diagnosis Date  . Urinary incontinence   . Hypertension   . Hyperlipidemia   . S/P TURP   . Arthritis   . Heart attack [facility])     Past Surgical History  Procedure Laterality Date  . Hernia repair    . Prostate surgery    . Hip fusion      lt  . Tonsillectomy    . Wrist surgery    . Cholecystectomy    . Knee surgery      rt knee    There were no vitals filed for this visit.      Subjective Assessment - 12/28/15 1438    Subjective Patient is having a difficult time with  mobiity.     Pertinent History airtuc riit dukatatuibm airtuc sckerisus, CAD, depression wiht anxiety, diverticulosis, hyperlipidemia, HTN, Hyperthyroidism, leg edama, OA, palpatiions, BLE hip replacement, R TKR,    Currently in Pain? Yes   Pain Score 3    Pain Location Foot   Pain Orientation Right;Left   Pain Descriptors / Indicators Aching      Therapeutic exercise:  standing hip abd with YTB x 20  side stepping left and right in parallel bars 10 feet x 3 Step over 1/2 foam left and right step ups from floor to 6 inch stool x 20 bilateral sit to stand x 10 marching in parallel bars x 20 Stretching hamstring BLE 30 x 3  Leg press 75 lbs x 20 x 3, heel raises 20 x 3 75 lbs Patient continues to demonstrates less incoordination of movement with  stepping equal leg lengths.  Patient responds well to verbal and tactile cues to correct form and technique.  Muscle fatigue but no major pain complaints.                            PT Education - 12/28/15 1439    Education provided Yes   Education Details safety with walking   Person(s) Educated Patient   Methods Explanation   Comprehension Verbalized understanding             PT Long Term Goals - 12/25/15 1450    PT LONG TERM GOAL #1   Title Patient will be independent in home exercise program to improve strength/mobility for better functional independence with ADLs   Time 8   Period Weeks   Status Partially Met   PT LONG TERM GOAL #2   Title Patient will increase six minute walk test distance to >1000 for progression to community ambulator and improve gait ability   Time 8   Period Weeks   Status Partially Met   PT LONG TERM GOAL #3   Title Patient will increase 10 meter walk  test to >1.19ms as to improve gait speed for better community ambulation and to reduce fall risk   Time 8   Period Weeks   Status Partially Met   PT LONG TERM GOAL #4   Title Patient will reduce timed up and go to <11 seconds to reduce fall risk and demonstrate improved transfer/gait ability.   Time 8   Period Weeks   Status Partially Met             Patient will benefit from skilled therapeutic intervention in order to improve the following deficits and impairments:     Visit Diagnosis: Difficulty in walking, not elsewhere classified  Muscle weakness (generalized)     Problem List Patient Active Problem List   Diagnosis Date Noted  . Benign hypertensive heart disease without heart failure 12/23/2012  . Dyspnea on exertion 08/25/2012  . Pedal edema 08/25/2012  . Heart murmur 08/25/2012  . Right inguinal hernia 02/28/2011  KAlanson Puls PT, DPT  [place] 12/28/2015, 2:41 PM  [place] [facility] [address] [place] [address]Phone: [phone]  Fax:  [phone] Name: LLESLEE HAUETERMRN: [id number] of Birth: [birth year]

## 2016-01-01 ENCOUNTER — Ambulatory Visit: Payer: Medicare Other | Admitting: Physical Therapy

## 2016-01-01 ENCOUNTER — Encounter: Payer: Self-pay | Admitting: Physical Therapy

## 2016-01-01 DIAGNOSIS — M6281 Muscle weakness (generalized): Secondary | ICD-10-CM

## 2016-01-01 DIAGNOSIS — R262 Difficulty in walking, not elsewhere classified: Secondary | ICD-10-CM | POA: Diagnosis not present

## 2016-01-01 NOTE — Patient Instructions (Signed)
EXTENSION: Standing (Active)    Stand, both feet flat. Draw right leg behind body as far as possible. Use ___ lbs. Complete _2__ sets of _15__ repetitions. Perform _1__ sessions per day.  http://gtsc.exer.us/76   Copyright  VHI. All rights reserved.  Hip Flexion (Standing)    Stand with support. Squeeze pelvic floor and hold. Lift right knee upward. Hold for _3__ seconds. Relax for 1___ seconds. Repeat __15_ times. Do _1__ times a day. Repeat with other leg. Add ___ lb weight.    Copyright  VHI. All rights reserved.  Seated Alternating Leg Raise (Marching)    Sit on ball. Raise bent knee and return. Repeat with other leg. Do _2__ sets of _15__ repetitions.  Copyright  VHI. All rights reserved.  Bracing With Bridging (Hook-Lying)    With neutral spine, tighten pelvic floor and abdominals and hold. Lift bottom. Repeat _15__ times. Do 1___ times a day.   Copyright  VHI. All rights reserved.  Side Leg Raise (Standing)    Stand with support and extend leg out to side. Keep leg straight and lead with heel. Keep torso straight. Hold 1 count. Slowly return to starting position. Repeat _15___ times, each leg.  Copyright  VHI. All rights reserved.  Seated Leg Extension    Sit on ball. Straighten knee and return. Repeat with other leg. Do __2 sets of __15_ repetitions.  Copyright  VHI. All rights reserved.  Leg Raise: Lying Lateral    Lie on side. dorsiflex ankles. Keep legs straight. Lift top leg toward ceiling. Do __1_ sets _15__ reps. Repeat reps with other leg to perform one set.  Copyright  VHI. All rights reserved.  Chair Sitting    Sit at edge of seat, spine straight, one leg extended. Put a hand on each thigh and bend forward from the hip, keeping spine straight. Allow hand on extended leg to reach toward toes. Support upper body with other arm. Hold 30___ seconds. Repeat _3__ times per session. Do _1__ sessions per day.  Copyright  VHI. All  rights reserved.

## 2016-01-01 NOTE — Therapy (Signed)
[place] MAIN [facility] SERVICES [address] [place], [place], [address] Phone: [phone]   Fax:  [phone]  Physical Therapy Treatment  Patient Details  Name: Perry [name] MRN: [id number] Date of Birth: [birth year] Referring Provider: Dr. Brigitte Pulse  Encounter Date: 01/01/2016      PT End of Session - 01/01/16 1459    Visit Number 12   Number of Visits 25   Authorization Type g codes   PT Start Time 0235   PT Stop Time 0315   PT Time Calculation (min) 40 min   Equipment Utilized During Treatment Gait belt   Activity Tolerance Patient limited by fatigue;Patient limited by pain      Past Medical History  Diagnosis Date  . Urinary incontinence   . Hypertension   . Hyperlipidemia   . S/P TURP   . Arthritis   . Heart attack [facility])     Past Surgical History  Procedure Laterality Date  . Hernia repair    . Prostate surgery    . Hip fusion      lt  . Tonsillectomy    . Wrist surgery    . Cholecystectomy    . Knee surgery      rt knee    There were no vitals filed for this visit.      Subjective Assessment - 01/01/16 1458    Subjective Patient is having a difficult time with  mobiity.     Pertinent History airtuc riit dukatatuibm airtuc sckerisus, CAD, depression wiht anxiety, diverticulosis, hyperlipidemia, HTN, Hyperthyroidism, leg edama, OA, palpatiions, BLE hip replacement, R TKR,    Currently in Pain? Yes   Pain Score 3    Pain Location Toe (Comment which one)  left foot       Therapeutic exercise:  Supine hip abd/add x 15 x 2 BLE Supine SLR x 10 x 2 BLE hooklying hip abd/ER x 20 x 2 Bridging x 10 x 2 Trunk rotation left and right x 20 Seated knee flex with RTB/ marching in sitting/ LAQ x 10 x 2 Sit to stand with RW x 10 x 2. Patient needs occasional verbal cueing to improve posture and cueing to correctly perform exercises slowly, holding at end of range to increase motor firing of desired muscle to encourage  fatigue.                           PT Education - 01/01/16 1459    Education provided Yes   Education Details Safety with HEP   Person(s) Educated Patient   Methods Explanation   Comprehension Verbalized understanding             PT Long Term Goals - 12/25/15 1450    PT LONG TERM GOAL #1   Title Patient will be independent in home exercise program to improve strength/mobility for better functional independence with ADLs   Time 8   Period Weeks   Status Partially Met   PT LONG TERM GOAL #2   Title Patient will increase six minute walk test distance to >1000 for progression to community ambulator and improve gait ability   Time 8   Period Weeks   Status Partially Met   PT LONG TERM GOAL #3   Title Patient will increase 10 meter walk test to >1.71ms as to improve gait speed for better community ambulation and to reduce fall risk   Time 8   Period Weeks  Status Partially Met   PT LONG TERM GOAL #4   Title Patient will reduce timed up and go to <11 seconds to reduce fall risk and demonstrate improved transfer/gait ability.   Time 8   Period Weeks   Status Partially Met               Plan - 01/01/16 1500    Clinical Impression Statement Patient has LE weakness and was able to perform LE exercises with RTB to improve mobility.    Rehab Potential Good   Clinical Impairments Affecting Rehab Potential patients clinical presentation is stable, he has 13 steps to go down to the basement to use his stationary bike,    PT Frequency 2x / week   PT Duration 12 weeks   PT Treatment/Interventions Therapeutic exercise;Balance training;Therapeutic activities;Gait training;Stair training   PT Next Visit Plan strengthening and balance   PT Home Exercise Plan supine therex given 4/12   Consulted and Agree with Plan of Care Patient      Patient will benefit from skilled therapeutic intervention in order to improve the following deficits and impairments:   Abnormal gait, Decreased activity tolerance, Decreased balance, Difficulty walking, Decreased strength, Impaired flexibility, Pain, Decreased mobility, Decreased endurance  Visit Diagnosis: Difficulty in walking, not elsewhere classified  Muscle weakness (generalized)     Problem List Patient Active Problem List   Diagnosis Date Noted  . Benign hypertensive heart disease without heart failure 12/23/2012  . Dyspnea on exertion 08/25/2012  . Pedal edema 08/25/2012  . Heart murmur 08/25/2012  . Right inguinal hernia 02/28/2011  Alanson Puls, PT, DPT  [place], [place] S 01/01/2016, 3:02 PM  [place] MAIN [facility] SERVICES [address] [place], [place], [address] Phone: [phone]   Fax:  [phone]  Name: Perry [name] MRN: [id number] Date of Birth: Jan 02, 1929

## 2016-01-03 ENCOUNTER — Ambulatory Visit: Payer: Medicare Other | Admitting: Physical Therapy

## 2016-01-03 ENCOUNTER — Encounter: Payer: Self-pay | Admitting: Physical Therapy

## 2016-01-03 DIAGNOSIS — M6281 Muscle weakness (generalized): Secondary | ICD-10-CM

## 2016-01-03 DIAGNOSIS — R262 Difficulty in walking, not elsewhere classified: Secondary | ICD-10-CM

## 2016-01-03 NOTE — Therapy (Signed)
[place] MAIN [facility] SERVICES [address] [place], [place], [address] Phone: [phone]   Fax:  [phone]  Physical Therapy Treatment  Patient Details  Name: Perry [name] MRN: [id number] Date of Birth: 04/19/1929 Referring Provider: Dr. Brigitte Pulse  Encounter Date: 01/03/2016      PT End of Session - 01/03/16 1437    Visit Number 13   Number of Visits 25   Authorization Type g codes   PT Start Time 0230   PT Stop Time 0310   PT Time Calculation (min) 40 min   Equipment Utilized During Treatment Gait belt   Activity Tolerance Patient limited by fatigue;Patient limited by pain      Past Medical History  Diagnosis Date  . Urinary incontinence   . Hypertension   . Hyperlipidemia   . S/P TURP   . Arthritis   . Heart attack [facility])     Past Surgical History  Procedure Laterality Date  . Hernia repair    . Prostate surgery    . Hip fusion      lt  . Tonsillectomy    . Wrist surgery    . Cholecystectomy    . Knee surgery      rt knee    There were no vitals filed for this visit.      Subjective Assessment - 01/03/16 1435    Subjective Patient is trying to get his loftstrand crutch and they ordered it.    Pertinent History airtuc riit dukatatuibm airtuc sckerisus, CAD, depression wiht anxiety, diverticulosis, hyperlipidemia, HTN, Hyperthyroidism, leg edama, OA, palpatiions, BLE hip replacement, R TKR,    Currently in Pain? No/denies      Nustep L 2 x 5 min no charge warm up Supine march 2x10 Supine hip abd/add 2x10 Bridge 2x10 Supine hip ER yellow band 2x10 Sitting LAQ 2x10 Sitting clamshell yellow band 2x10 Sitting ankle PF/DF with 1lb on knees 2x10 Sitting pillow hip adductor squeeze 2x10  Gait training with loftstrand crutches 1000 feet x 2 Gait training with TM walking . 7 miles/hour x 4 minutes x 2  Patient continues to demonstrates less incoordination of movement with select exercises such as rock and reach and  stepping backwards. Patient responds well to verbal and tactile cues to correct form and technique. .  Muscle fatigue but no major pain complaints. Pt requires mod verbal and tactile cues for proper exercise performance   pt requires CGA to move between equipment and for transfers                            PT Education - 01/03/16 1437    Education provided Yes   Education Details safety with loft strand crutch   Person(s) Educated Patient   Methods Explanation   Comprehension Verbalized understanding             PT Long Term Goals - 12/25/15 1450    PT LONG TERM GOAL #1   Title Patient will be independent in home exercise program to improve strength/mobility for better functional independence with ADLs   Time 8   Period Weeks   Status Partially Met   PT LONG TERM GOAL #2   Title Patient will increase six minute walk test distance to >1000 for progression to community ambulator and improve gait ability   Time 8   Period Weeks   Status Partially Met   PT LONG TERM GOAL #3  Title Patient will increase 10 meter walk test to >1.69ms as to improve gait speed for better community ambulation and to reduce fall risk   Time 8   Period Weeks   Status Partially Met   PT LONG TERM GOAL #4   Title Patient will reduce timed up and go to <11 seconds to reduce fall risk and demonstrate improved transfer/gait ability.   Time 8   Period Weeks   Status Partially Met               Plan - 01/03/16 1438    Clinical Impression Statement Continues to have balance deficits typical with diagnosis. Patient performs intermediate level exercises without pain behaviors and needs verbal cuing for postural alignment and head positioning.   Rehab Potential Good   Clinical Impairments Affecting Rehab Potential patients clinical presentation is stable, he has 13 steps to go down to the basement to use his stationary bike,    PT Frequency 2x / week   PT Duration 12 weeks    PT Treatment/Interventions Therapeutic exercise;Balance training;Therapeutic activities;Gait training;Stair training   PT Next Visit Plan strengthening and balance   PT Home Exercise Plan supine therex given 4/12   Consulted and Agree with Plan of Care Patient      Patient will benefit from skilled therapeutic intervention in order to improve the following deficits and impairments:  Abnormal gait, Decreased activity tolerance, Decreased balance, Difficulty walking, Decreased strength, Impaired flexibility, Pain, Decreased mobility, Decreased endurance  Visit Diagnosis: Difficulty in walking, not elsewhere classified  Muscle weakness (generalized)     Problem List Patient Active Problem List   Diagnosis Date Noted  . Benign hypertensive heart disease without heart failure 12/23/2012  . Dyspnea on exertion 08/25/2012  . Pedal edema 08/25/2012  . Heart murmur 08/25/2012  . Right inguinal hernia 02/28/2011  KAlanson Puls PT, DPT  [place] Head[place]S 01/03/2016, 2:39 PM  [place] [facility] [address] [address]Phone: [phone]  Fax:  [phone] Name: Perry SMOKERMRN: [id number] of Birth: 308/03/1929

## 2016-01-10 ENCOUNTER — Ambulatory Visit: Payer: Medicare Other | Admitting: Physical Therapy

## 2016-01-10 ENCOUNTER — Encounter: Payer: Self-pay | Admitting: Physical Therapy

## 2016-01-10 DIAGNOSIS — R262 Difficulty in walking, not elsewhere classified: Secondary | ICD-10-CM

## 2016-01-10 DIAGNOSIS — M6281 Muscle weakness (generalized): Secondary | ICD-10-CM

## 2016-01-10 NOTE — Therapy (Signed)
[place] MAIN [facility] SERVICES [address] [place], [place], [address] Phone: [phone]   Fax:  [phone]  Physical Therapy Treatment  Patient Details  Name: Perry [name] MRN: [id number] Date of Birth: 11/11/1928 Referring Provider: Dr. Brigitte Pulse  Encounter Date: 01/10/2016      PT End of Session - 01/10/16 1612    Visit Number 14   Number of Visits 25   Authorization Type g codes   PT Start Time 0415   PT Stop Time 0500   PT Time Calculation (min) 45 min   Equipment Utilized During Treatment Gait belt   Activity Tolerance Patient limited by fatigue;Patient limited by pain      Past Medical History  Diagnosis Date  . Urinary incontinence   . Hypertension   . Hyperlipidemia   . S/P TURP   . Arthritis   . Heart attack [facility])     Past Surgical History  Procedure Laterality Date  . Hernia repair    . Prostate surgery    . Hip fusion      lt  . Tonsillectomy    . Wrist surgery    . Cholecystectomy    . Knee surgery      rt knee    There were no vitals filed for this visit.      Subjective Assessment - 01/10/16 1610    Subjective Patient was able to gett his loftstrand crutches   Pertinent History airtuc riit dukatatuibm airtuc sckerisus, CAD, depression wiht anxiety, diverticulosis, hyperlipidemia, HTN, Hyperthyroidism, leg edama, OA, palpatiions, BLE hip replacement, R TKR,    Currently in Pain? No/denies       Gait training with 1 loft strand crutch secondary to R hand sprain with SBA and 1000 feet x 2   Therapeutic exercise: Hooklying abd/ER x 20 with RTB x 20 x 2 hooklying marching with 3 lb weight x 20 x 3 sets sidelying abd with leg straight and knee flexed with hips flexed x 15 x 2 left and right Patient needs occasional verbal cueing to improve posture and cueing to correctly perform exercises slowly, holding at end of range to increase motor firing of desired muscle to encourage fatigue.                             PT Education - 01/10/16 1713    Education provided Yes             PT Long Term Goals - 12/25/15 1450    PT LONG TERM GOAL #1   Title Patient will be independent in home exercise program to improve strength/mobility for better functional independence with ADLs   Time 8   Period Weeks   Status Partially Met   PT LONG TERM GOAL #2   Title Patient will increase six minute walk test distance to >1000 for progression to community ambulator and improve gait ability   Time 8   Period Weeks   Status Partially Met   PT LONG TERM GOAL #3   Title Patient will increase 10 meter walk test to >1.54ms as to improve gait speed for better community ambulation and to reduce fall risk   Time 8   Period Weeks   Status Partially Met   PT LONG TERM GOAL #4   Title Patient will reduce timed up and go to <11 seconds to reduce fall risk and demonstrate improved transfer/gait ability.   Time 8  Period Weeks   Status Partially Met             Patient will benefit from skilled therapeutic intervention in order to improve the following deficits and impairments:     Visit Diagnosis: Difficulty in walking, not elsewhere classified  Muscle weakness (generalized)     Problem List Patient Active Problem List   Diagnosis Date Noted  . Benign hypertensive heart disease without heart failure 12/23/2012  . Dyspnea on exertion 08/25/2012  . Pedal edema 08/25/2012  . Heart murmur 08/25/2012  . Right inguinal hernia 02/28/2011    S , PT, DPT ,  S 01/10/2016, 5:16 PM  [facility] MAIN REHAB SERVICES [address] [place], [place], [address] Phone: [phone]   Fax:  [phone]  Name: Perry [name] MRN: [id number] Date of Birth: 01/05/1929    

## 2016-01-11 ENCOUNTER — Ambulatory Visit: Payer: Medicare Other | Admitting: Physical Therapy

## 2016-01-16 ENCOUNTER — Encounter: Payer: Self-pay | Admitting: Physical Therapy

## 2016-01-16 ENCOUNTER — Ambulatory Visit: Payer: Medicare Other | Attending: Neurology | Admitting: Physical Therapy

## 2016-01-16 DIAGNOSIS — M6281 Muscle weakness (generalized): Secondary | ICD-10-CM | POA: Insufficient documentation

## 2016-01-16 DIAGNOSIS — R262 Difficulty in walking, not elsewhere classified: Secondary | ICD-10-CM | POA: Diagnosis not present

## 2016-01-16 NOTE — Therapy (Signed)
[place] MAIN [facility] SERVICES [address] [place], [place], [address] Phone: [phone]   Fax:  [phone]  Physical Therapy Treatment  Patient Details  Name: Perry [name] MRN: [id number] Date of Birth: August 29, 1928 Referring Provider: Dr. Brigitte Pulse  Encounter Date: 01/16/2016      PT End of Session - 01/16/16 1538    Visit Number 15   Number of Visits 25   Authorization Type g codes   PT Start Time 0330   PT Stop Time 0410   PT Time Calculation (min) 40 min   Equipment Utilized During Treatment Gait belt   Activity Tolerance Patient limited by fatigue;Patient limited by pain   Behavior During Therapy [facility] for tasks assessed/performed      Past Medical History  Diagnosis Date  . Urinary incontinence   . Hypertension   . Hyperlipidemia   . S/P TURP   . Arthritis   . Heart attack [facility])     Past Surgical History  Procedure Laterality Date  . Hernia repair    . Prostate surgery    . Hip fusion      lt  . Tonsillectomy    . Wrist surgery    . Cholecystectomy    . Knee surgery      rt knee    There were no vitals filed for this visit.      Subjective Assessment - 01/16/16 1537    Subjective Patient was able to gett his loftstrand crutches. He has started practicing walking with 2 loft strand crutches .    Pertinent History airtuc riit dukatatuibm airtuc sckerisus, CAD, depression wiht anxiety, diverticulosis, hyperlipidemia, HTN, Hyperthyroidism, leg edama, OA, palpatiions, BLE hip replacement, R TKR,    Currently in Pain? No/denies      There ex: Hip abd with RTB , hip ext with RTB x 20 BLE sit to stand 2x10   Mini squats in //bars  standing hip abd with YTB x 20  side stepping left and right in parallel bars 10 feet x 3 step ups from floor to 6 inch stool x 20 bilateral sit to stand x 10 marching in parallel bars x 20  Gait training with loftstrand crutches and CGA 500 feet x 2 with rest period Patient needs  occasional verbal cueing to improve posture and cueing to correctly perform exercises slowly, holding at end of range to increase motor firing of desired muscle to encourage fatigue. CGA and Min  verbal cues used throughout with increased in postural sway and LOB most seen with narrow base of support and while on uneven surfaces. Continues to have balance deficits typical with diagnosis. Patient performs intermediate level exercises without pain behaviors and needs verbal cuing for postural alignment and head positioning .                            PT Education - 01/16/16 1538    Education provided Yes   Education Details saftey with 2 crutches   Person(s) Educated Patient   Methods Explanation   Comprehension Verbalized understanding             PT Long Term Goals - 12/25/15 1450    PT LONG TERM GOAL #1   Title Patient will be independent in home exercise program to improve strength/mobility for better functional independence with ADLs   Time 8   Period Weeks   Status Partially Met   PT LONG TERM  GOAL #2   Title Patient will increase six minute walk test distance to >1000 for progression to community ambulator and improve gait ability   Time 8   Period Weeks   Status Partially Met   PT LONG TERM GOAL #3   Title Patient will increase 10 meter walk test to >1.79ms as to improve gait speed for better community ambulation and to reduce fall risk   Time 8   Period Weeks   Status Partially Met   PT LONG TERM GOAL #4   Title Patient will reduce timed up and go to <11 seconds to reduce fall risk and demonstrate improved transfer/gait ability.   Time 8   Period Weeks   Status Partially Met               Plan - 01/16/16 1539    Clinical Impression Statement Pt reports discomfort during but overall less joint stiffness and pain following. He is more confident of the ambulation using the loftstrand crutches with BUE.   Rehab Potential Good   Clinical  Impairments Affecting Rehab Potential patients clinical presentation is stable, he has 13 steps to go down to the basement to use his stationary bike,    PT Frequency 2x / week   PT Duration 12 weeks   PT Treatment/Interventions Therapeutic exercise;Balance training;Therapeutic activities;Gait training;Stair training   PT Next Visit Plan strengthening and balance   PT Home Exercise Plan supine therex given 4/12   Consulted and Agree with Plan of Care Patient      Patient will benefit from skilled therapeutic intervention in order to improve the following deficits and impairments:  Abnormal gait, Decreased activity tolerance, Decreased balance, Difficulty walking, Decreased strength, Impaired flexibility, Pain, Decreased mobility, Decreased endurance  Visit Diagnosis: Difficulty in walking, not elsewhere classified  Muscle weakness (generalized)     Problem List Patient Active Problem List   Diagnosis Date Noted  . Benign hypertensive heart disease without heart failure 12/23/2012  . Dyspnea on exertion 08/25/2012  . Pedal edema 08/25/2012  . Heart murmur 08/25/2012  . Right inguinal hernia 02/28/2011   KAlanson Puls PT, DPT [place] K[place]ette [place]landS 01/16/2016, 3:40 PM  [place] [facility] [address] [address]Phone: [phone]  Fax:  [phone] Name: Perry WIACEKMRN: [id number] of Birth: 306/28/1930

## 2016-01-18 ENCOUNTER — Encounter: Payer: Self-pay | Admitting: Physical Therapy

## 2016-01-18 ENCOUNTER — Ambulatory Visit: Payer: Medicare Other | Admitting: Physical Therapy

## 2016-01-18 DIAGNOSIS — R262 Difficulty in walking, not elsewhere classified: Secondary | ICD-10-CM

## 2016-01-18 DIAGNOSIS — M6281 Muscle weakness (generalized): Secondary | ICD-10-CM

## 2016-01-18 NOTE — Therapy (Signed)
[place] MAIN [facility] SERVICES [address] [place], [place], [address] Phone: [phone]   Fax:  [phone]  Physical Therapy Treatment  Patient Details  Name: Perry [name] MRN: [id number] Date of Birth: 13-Feb-1929 Referring Provider: Dr. Brigitte Pulse  Encounter Date: 01/18/2016      PT End of Session - 01/18/16 1600    Visit Number 16   Number of Visits 25   Date for PT Re-Evaluation 2016-02-07   Authorization Type g codes   PT Start Time 0330   PT Stop Time 0410   PT Time Calculation (min) 40 min   Equipment Utilized During Treatment Gait belt   Activity Tolerance Patient limited by fatigue;Patient limited by pain   Behavior During Therapy [facility] for tasks assessed/performed      Past Medical History  Diagnosis Date  . Urinary incontinence   . Hypertension   . Hyperlipidemia   . S/P TURP   . Arthritis   . Heart attack [facility])     Past Surgical History  Procedure Laterality Date  . Hernia repair    . Prostate surgery    . Hip fusion      lt  . Tonsillectomy    . Wrist surgery    . Cholecystectomy    . Knee surgery      rt knee    There were no vitals filed for this visit.      Subjective Assessment - 01/18/16 1559    Subjective Patient was able to gett his loftstrand crutches. He has started practicing walking with 2 loft strand crutches .    Pertinent History airtuc riit dukatatuibm airtuc sckerisus, CAD, depression wiht anxiety, diverticulosis, hyperlipidemia, HTN, Hyperthyroidism, leg edama, OA, palpatiions, BLE hip replacement, R TKR,    Currently in Pain? No/denies     Gait training :  Gait training with loft strand crutches on carpet and out doors with emphasis on long steps 1000 feet x 3 Gait training in parallel bars without UE support and emphasis on long steps  Therapeutic exercise: Squats x 20 with 3 sec hold Marching in place x 20  Step ups to 6 inch stool x 20  Hip 4 way with RTB x 20 Patient takes seated  rest periods with ambulation and needs verbal cuing to take long steps. Patient needs occasional cuing for posture and sequencing.                            PT Education - 01/18/16 1559    Education provided Yes   Education Details safety with mobility   Person(s) Educated Patient   Methods Explanation   Comprehension Verbalized understanding             PT Long Term Goals - 12/25/15 1450    PT LONG TERM GOAL #1   Title Patient will be independent in home exercise program to improve strength/mobility for better functional independence with ADLs   Time 8   Period Weeks   Status Partially Met   PT LONG TERM GOAL #2   Title Patient will increase six minute walk test distance to >1000 for progression to community ambulator and improve gait ability   Time 8   Period Weeks   Status Partially Met   PT LONG TERM GOAL #3   Title Patient will increase 10 meter walk test to >1.33ms as to improve gait speed for better community ambulation and to reduce fall  risk   Time 8   Period Weeks   Status Partially Met   PT LONG TERM GOAL #4   Title Patient will reduce timed up and go to <11 seconds to reduce fall risk and demonstrate improved transfer/gait ability.   Time 8   Period Weeks   Status Partially Met               Plan - 01/18/16 1600    Clinical Impression Statement  Fatigue with sit to stand  and ambulation with loft strand crutches. but demonstrating more control, Increase weight for standing exercises.    Clinical Impairments Affecting Rehab Potential patients clinical presentation is stable, he has 13 steps to go down to the basement to use his stationary bike,    PT Frequency 2x / week   PT Duration 12 weeks   PT Treatment/Interventions Therapeutic exercise;Balance training;Therapeutic activities;Gait training;Stair training   PT Next Visit Plan strengthening and balance   PT Home Exercise Plan supine therex given 4/12   Consulted and Agree  with Plan of Care Patient      Patient will benefit from skilled therapeutic intervention in order to improve the following deficits and impairments:  Abnormal gait, Decreased activity tolerance, Decreased balance, Difficulty walking, Decreased strength, Impaired flexibility, Pain, Decreased mobility, Decreased endurance  Visit Diagnosis: Difficulty in walking, not elsewhere classified  Muscle weakness (generalized)     Problem List Patient Active Problem List   Diagnosis Date Noted  . Benign hypertensive heart disease without heart failure 12/23/2012  . Dyspnea on exertion 08/25/2012  . Pedal edema 08/25/2012  . Heart murmur 08/25/2012  . Right inguinal hernia 02/28/2011  Alanson Puls, PT, DPT  [place], [place] S 01/18/2016, 4:03 PM  [place] MAIN [facility] SERVICES [address] [place] , [place], [address] Phone: [phone]   Fax:  [phone]  Name: Perry [name] MRN: [id number] Date of Birth: 01-21-1929

## 2016-01-23 ENCOUNTER — Ambulatory Visit: Payer: Medicare Other | Admitting: Physical Therapy

## 2016-01-25 ENCOUNTER — Ambulatory Visit: Payer: Medicare Other | Admitting: Physical Therapy

## 2016-01-30 ENCOUNTER — Ambulatory Visit: Payer: Medicare Other | Admitting: Physical Therapy

## 2016-02-01 ENCOUNTER — Ambulatory Visit: Payer: Medicare Other | Admitting: Physical Therapy

## 2016-02-06 ENCOUNTER — Ambulatory Visit: Payer: Medicare Other | Admitting: Physical Therapy

## 2016-02-08 ENCOUNTER — Ambulatory Visit: Payer: Medicare Other | Admitting: Physical Therapy

## 2016-02-12 ENCOUNTER — Ambulatory Visit: Payer: Medicare Other | Admitting: Physical Therapy

## 2016-02-14 ENCOUNTER — Ambulatory Visit: Payer: Medicare Other | Admitting: Physical Therapy

## 2016-02-19 ENCOUNTER — Ambulatory Visit: Payer: Medicare Other | Admitting: Physical Therapy

## 2016-02-21 ENCOUNTER — Ambulatory Visit: Payer: Medicare Other | Admitting: Physical Therapy

## 2016-02-26 ENCOUNTER — Ambulatory Visit: Payer: Medicare Other | Admitting: Physical Therapy

## 2016-02-28 ENCOUNTER — Ambulatory Visit: Payer: Medicare Other | Admitting: Physical Therapy

## 2016-03-04 ENCOUNTER — Ambulatory Visit: Payer: Medicare Other | Admitting: Physical Therapy

## 2016-03-06 ENCOUNTER — Ambulatory Visit: Payer: Medicare Other | Admitting: Physical Therapy

## 2016-04-25 ENCOUNTER — Ambulatory Visit: Payer: Medicare Other | Admitting: Physician Assistant

## 2016-05-06 DIAGNOSIS — M65351 Trigger finger, right little finger: Secondary | ICD-10-CM | POA: Insufficient documentation

## 2016-05-17 ENCOUNTER — Ambulatory Visit: Payer: Medicare Other

## 2016-05-17 ENCOUNTER — Encounter: Payer: Self-pay | Admitting: Cardiovascular Disease

## 2016-05-17 ENCOUNTER — Ambulatory Visit (INDEPENDENT_AMBULATORY_CARE_PROVIDER_SITE_OTHER): Payer: Medicare Other | Admitting: Cardiovascular Disease

## 2016-05-17 VITALS — BP 120/60 | HR 54 | Ht 69.0 in | Wt 176.5 lb

## 2016-05-17 DIAGNOSIS — R011 Cardiac murmur, unspecified: Secondary | ICD-10-CM

## 2016-05-17 DIAGNOSIS — Z23 Encounter for immunization: Secondary | ICD-10-CM

## 2016-05-17 DIAGNOSIS — R0609 Other forms of dyspnea: Secondary | ICD-10-CM

## 2016-05-17 DIAGNOSIS — I119 Hypertensive heart disease without heart failure: Secondary | ICD-10-CM

## 2016-05-17 DIAGNOSIS — M7989 Other specified soft tissue disorders: Secondary | ICD-10-CM

## 2016-05-17 DIAGNOSIS — R001 Bradycardia, unspecified: Secondary | ICD-10-CM | POA: Insufficient documentation

## 2016-05-17 NOTE — Progress Notes (Addendum)
Cardiology Office Note  Date:  05/17/2016   ID:  Perry [name], DOB 11/10/1928, MRN [id number]  PCP:  Lynnea Ferrier, MD   Chief Complaint  Patient presents with  . other    6 month follow up. Former patient of Dr. Yevonne Pax. Meds reviewed by the pt's med list. "doing well."     HPI:  Perry [name] is a 82 y.o. male Was previously seen by cardiology in [place] who presents to establish care in the [place] office and for follow-up of his shortness of breath on exertion  No prior cardiac history, no coronary disease noted, no structural heart disease  Notes indicate that>5 years ago he was worked up at [facility] and had an MRI of his heart looking for a possible scar but he does not recall the results.   echocardiogram on 09/03/12 which showed an ejection fraction of 55-65% and grade 1 diastolic dysfunction. There was mild aortic insufficiency and mild mitral regurgitation.  He reports having weight loss over the past several years No regular exercise program Amlodipine held previously by cardiology for leg swelling. For some reason he is still taking amlodipine, just got a refill He does not check his blood pressure at home, denies any orthostasis symptoms  Lab work reviewed with him in detail HCT 35, CR 0.9 toal chol 190, LDL 124 (down from 151 in 02/8294) TSH 0.115  EKG on today's visit shows normal sinus rhythm with rate 54 bpm, right bundle branch block  Other past medical history overactive bladder for which he sees Dr. Cassell Smiles.    history of skin cancer for which he is followed at [facility].   chronic peripheral edema and stasis dermatitis related to venous insufficiency.   He does not have any history of DVT.  He had venous Dopplers of his legs in 2012 which were negative for DVT.   PMH:   has a past medical history of Arthritis; Heart attack; Hyperlipidemia; Hypertension; Parkinson's disease (HCC); S/P TURP; and Urinary incontinence.  PSH:    Past Surgical  History:  Procedure Laterality Date  . CHOLECYSTECTOMY    . HERNIA REPAIR    . HIP FUSION     lt  . KNEE SURGERY     rt knee  . PROSTATE SURGERY    . TONSILLECTOMY    . WRIST SURGERY      Current Outpatient Prescriptions  Medication Sig Dispense Refill  . B Complex Vitamins (B-COMPLEX/B-12 PO) Take 1 capsule by mouth daily.     . benazepril (LOTENSIN) 20 MG tablet Take 20 mg by mouth 2 (two) times daily.      . calcium-vitamin D (OSCAL WITH D) 500-200 MG-UNIT per tablet Take 1 tablet by mouth daily.      . carbidopa-levodopa (SINEMET IR) 25-100 MG tablet Take by mouth.    . carvedilol (COREG) 12.5 MG tablet Take 12.5 mg by mouth 2 (two) times daily with a meal.    . Cholecalciferol (VITAMIN D) 2000 UNITS CAPS Take 2,000 Units by mouth daily.     . Coenzyme Q10 (CO Q-10) 200 MG CAPS Take 200 mg by mouth daily.    . cyanocobalamin (V-R VITAMIN B-12) 500 MCG tablet Take by mouth.    . fish oil-omega-3 fatty acids 1000 MG capsule Take 2 g by mouth daily.      Marland Kitchen HYDROcodone-acetaminophen (NORCO/VICODIN) 5-325 MG tablet Take 1 tablet by mouth every 4 (four) hours as needed for moderate pain. 20 tablet 0  .  hydrocortisone 2.5 % cream Apply 1 application topically daily as needed. (affected areas)    . magnesium 30 MG tablet Take 30 mg by mouth 2 (two) times daily.      . mirabegron ER (MYRBETRIQ) 50 MG TB24 Take 50 mg by mouth 2 (two) times daily.    Marland Kitchen OVER THE COUNTER MEDICATION Take 1 capsule by mouth 2 (two) times daily. (Juice plus capsules)    . potassium chloride SA (K-DUR,KLOR-CON) 20 MEQ tablet Take 20 mEq by mouth 2 (two) times daily.    . psyllium (METAMUCIL) 58.6 % powder Take 1 packet by mouth daily.    . sertraline (ZOLOFT) 50 MG tablet Take by mouth.    . tadalafil (CIALIS) 20 MG tablet Take 20 mg by mouth daily as needed. (erectile dysfunction)    . timolol (TIMOPTIC) 0.5 % ophthalmic solution Place 1 drop into the left eye 2 (two) times daily.      No current  facility-administered medications for this visit.      Allergies:   Tetanus toxoids; Atorvastatin; and Rosuvastatin   Social History:  The patient  reports that he has quit smoking. His smoking use included Cigars. He has never used smokeless tobacco. He reports that he does not drink alcohol.   Family History:   family history includes Appendicitis in his brother; Healthy in his mother; Heart disease in his sister; Hypertension in his father and sister; Ovarian cancer in his sister.    Review of Systems: Review of Systems  Constitutional: Negative.   Respiratory: Negative.   Cardiovascular: Positive for leg swelling.  Gastrointestinal: Negative.   Musculoskeletal: Negative.        Gait instability  Neurological: Negative.   Psychiatric/Behavioral: Negative.   All other systems reviewed and are negative.    PHYSICAL EXAM: VS:  BP 120/60 (BP Location: Left Arm, Patient Position: Sitting, Cuff Size: Normal)   Pulse (!) 54   Ht 5\' 9"  (1.753 m)   Wt 176 lb 8 oz (80.1 kg)   BMI 26.06 kg/m  , BMI Body mass index is 26.06 kg/m. GEN: Well nourished, well developed, in no acute distress  HEENT: normal  Neck: no JVD, carotid bruits, or masses Cardiac: Regular rhythm, rate of cardiac, II/VI SEM RSB, no  rubs, or gallops, chronic leg swelling, trace pitting Respiratory:  clear to auscultation bilaterally, normal work of breathing GI: soft, nontender, nondistended, + BS MS: no deformity or atrophy  Skin: warm and dry, no rash Neuro:  Strength and sensation are intact Psych: euthymic mood, full affect    Recent Labs: No results found for requested labs within last 8760 hours.    Lipid Panel No results found for: CHOL, HDL, LDLCALC, TRIG    Wt Readings from Last 3 Encounters:  05/17/16 176 lb 8 oz (80.1 kg)  11/29/15 173 lb (78.5 kg)  10/05/15 178 lb 9.6 oz (81 kg)       ASSESSMENT AND PLAN:  Benign hypertensive heart disease without heart failure - Plan: EKG  12-Lead Recommended he hold amlodipine, blood pressure is very acceptable Amlodipine may contribute to his leg swelling  Heart murmur - Plan: EKG 12-Lead Likely has aortic valve sclerosis, no significant stenosis  Dyspnea on exertion - Plan: EKG 12-Lead Mild shortness of breath, chronic issue. Likely secondary to deconditioning, bradycardia Prior workup unrevealing. No further workup at this time  Encounter for immunization - Plan: Flu Vaccine QUAD 36+ mos IM Flu shot provided  Leg swelling Venous stasis. We will  hold the amlodipine  Bradycardia Discussed his low heart rate with him in detail If blood pressure and heart rate continues to run low after holding amlodipine, Would decrease carvedilol down to 6.25 mg twice a day Recommended he call us with numbers after stopping amlodipine   Total encounter time more than 25 minutes  Greater than 50% was spent in counseling and coordination of care with the patient   Disposition:   F/U  12 months   Orders Placed This Encounter  Procedures  . Flu Vaccine QUAD 36+ mos IM  . EKG 12-Lead     Signed, Dossie Arbourim Trenity Pha, M.D., Ph.D. 05/17/2016  [facility], [place] [phone]

## 2016-05-17 NOTE — Patient Instructions (Signed)
Medication Instructions:   Please hold the amlodipine This can cause leg swelling  Monitor your blood pressure and heart rate  Labwork:  No new labs needed  Testing/Procedures:  No further testing at this time   Follow-Up: It was a pleasure seeing you in the office today. Please call us if you have new issues that need to be addressed before your next appt.  [phone]  Your physician wants you to follow-up in: 6 months.  You will receive a reminder letter in the mail two months in advance. If you don't receive a letter, please call our office to schedule the follow-up appointment.  If you need a refill on your cardiac medications before your next appointment, please call your pharmacy.

## 2016-08-07 ENCOUNTER — Encounter: Payer: Self-pay | Admitting: Emergency Medicine

## 2016-08-07 ENCOUNTER — Emergency Department
Admission: EM | Admit: 2016-08-07 | Discharge: 2016-08-07 | Disposition: A | Discharge status: Home / Self Care | Payer: Medicare Other | Attending: Emergency Medicine | Admitting: Emergency Medicine

## 2016-08-07 DIAGNOSIS — N390 Urinary tract infection, site not specified: Secondary | ICD-10-CM | POA: Diagnosis not present

## 2016-08-07 DIAGNOSIS — Y999 Unspecified external cause status: Secondary | ICD-10-CM | POA: Diagnosis not present

## 2016-08-07 DIAGNOSIS — Z87891 Personal history of nicotine dependence: Secondary | ICD-10-CM | POA: Diagnosis not present

## 2016-08-07 DIAGNOSIS — W010XXA Fall on same level from slipping, tripping and stumbling without subsequent striking against object, initial encounter: Secondary | ICD-10-CM | POA: Insufficient documentation

## 2016-08-07 DIAGNOSIS — Y939 Activity, unspecified: Secondary | ICD-10-CM | POA: Insufficient documentation

## 2016-08-07 DIAGNOSIS — W19XXXA Unspecified fall, initial encounter: Secondary | ICD-10-CM

## 2016-08-07 DIAGNOSIS — Y929 Unspecified place or not applicable: Secondary | ICD-10-CM | POA: Diagnosis not present

## 2016-08-07 DIAGNOSIS — R531 Weakness: Secondary | ICD-10-CM | POA: Diagnosis present

## 2016-08-07 DIAGNOSIS — I10 Essential (primary) hypertension: Secondary | ICD-10-CM | POA: Insufficient documentation

## 2016-08-07 DIAGNOSIS — Z79899 Other long term (current) drug therapy: Secondary | ICD-10-CM | POA: Diagnosis not present

## 2016-08-07 LAB — COMPREHENSIVE METABOLIC PANEL
ALT: 6 U/L — AB (ref 17–63)
AST: 17 U/L (ref 15–41)
Albumin: 4 g/dL (ref 3.5–5.0)
Alkaline Phosphatase: 59 U/L (ref 38–126)
Anion gap: 6 (ref 5–15)
BILIRUBIN TOTAL: 1.5 mg/dL — AB (ref 0.3–1.2)
BUN: 26 mg/dL — AB (ref 6–20)
CHLORIDE: 101 mmol/L (ref 101–111)
CO2: 31 mmol/L (ref 22–32)
CREATININE: 1.21 mg/dL (ref 0.61–1.24)
Calcium: 8.7 mg/dL — ABNORMAL LOW (ref 8.9–10.3)
GFR calc Af Amer: >60 mL/min (ref >60)
GFR, EST NON AFRICAN AMERICAN: 53 mL/min — AB (ref >60)
Glucose, Bld: 117 mg/dL — ABNORMAL HIGH (ref 65–99)
Potassium: 3.6 mmol/L (ref 3.5–5.1)
Sodium: 138 mmol/L (ref 135–145)
TOTAL PROTEIN: 6.7 g/dL (ref 6.5–8.1)

## 2016-08-07 LAB — URINALYSIS, COMPLETE (UACMP) WITH MICROSCOPIC
Bilirubin Urine: NEGATIVE
Glucose, UA: NEGATIVE mg/dL
Hgb urine dipstick: NEGATIVE
KETONES UR: 5 mg/dL — AB
Nitrite: POSITIVE — AB
PH: 7 (ref 5.0–8.0)
Protein, ur: 30 mg/dL — AB
Specific Gravity, Urine: 1.021 (ref 1.005–1.030)

## 2016-08-07 LAB — CBC WITH DIFFERENTIAL/PLATELET
BASOS ABS: 0 10*3/uL (ref 0–0.1)
Basophils Relative: 1 %
Eosinophils Absolute: 0.1 10*3/uL (ref 0–0.7)
Eosinophils Relative: 1 %
HEMATOCRIT: 37 % — AB (ref 40.0–52.0)
HEMOGLOBIN: 12.8 g/dL — AB (ref 13.0–18.0)
LYMPHS PCT: 4 %
Lymphs Abs: 0.3 10*3/uL — ABNORMAL LOW (ref 1.0–3.6)
MCH: 34.9 pg — ABNORMAL HIGH (ref 26.0–34.0)
MCHC: 34.6 g/dL (ref 32.0–36.0)
MCV: 101.1 fL — AB (ref 80.0–100.0)
Monocytes Absolute: 0.8 10*3/uL (ref 0.2–1.0)
Monocytes Relative: 11 %
NEUTROS ABS: 5.7 10*3/uL (ref 1.4–6.5)
Neutrophils Relative %: 83 %
Platelets: 62 10*3/uL — ABNORMAL LOW (ref 150–440)
RBC: 3.66 MIL/uL — AB (ref 4.40–5.90)
RDW: 14.1 % (ref 11.5–14.5)
WBC: 6.8 10*3/uL (ref 3.8–10.6)

## 2016-08-07 LAB — TROPONIN I

## 2016-08-07 MED ORDER — CEPHALEXIN 500 MG PO CAPS: 500.0000 mg | ORAL_CAPSULE | Freq: Three times a day (TID) | ORAL | 0 refills | Status: DC

## 2016-08-07 MED ORDER — SODIUM CHLORIDE 0.9 % IV BOLUS (SEPSIS)
1000.0000 mL | Freq: Once | INTRAVENOUS | Status: AC
  Administered 2016-08-07: 1000 mL via INTRAVENOUS

## 2016-08-07 MED ORDER — DEXTROSE 5 % IV SOLN: 1.0000 g | Freq: Once | INTRAVENOUS | Status: DC

## 2016-08-07 MED ORDER — CEFTRIAXONE SODIUM-DEXTROSE 1-3.74 GM-% IV SOLR
1.0000 g | Freq: Once | INTRAVENOUS | Status: AC
  Administered 2016-08-07: 1 g via INTRAVENOUS
  Filled 2016-08-07: qty 50

## 2016-08-07 NOTE — ED Triage Notes (Addendum)
Pt arrived via ems from home. Pt got up to go to the bathroom and tripped and fell which trapped him between the bed and dresser. Pt denies hitting head. Pt has a history of parkinson's. Pt alert and oriented. EMS vitals; blood pressure 161/77, heart rate 72, 95% on room air. Family reports pt has had a decreased mobility over the last few days and had planned to have him evaluated by MD today.

## 2016-08-07 NOTE — ED Provider Notes (Signed)
[facility] Emergency Department Provider Note  ____________________________________________   I have reviewed the triage vital signs and the nursing notes.   HISTORY  Chief Complaint Fall   History limited by: Not Limited   HPI Perry [name] is a 82 y.o. male who presents to the emergency department today because of concern for weakness and subsequent fall. The patient states he has a history of lower extremity parkinsons. Overnight the patient became acutely weak in the lower legs and fell when he tried to get out of bed. The patient was not able to get up after the fall. Denies hitting his head. Complaining of a little ankle pain and left elbow pain after the fall where he states they were in an odd position. The patient did feel like he was developing a cold yesterday (runny nose) and took two benadryl at some point overnight. Felt like he did have a fever yesterday.   Past Medical History:  Diagnosis Date  . Arthritis   . Heart attack   . Hyperlipidemia   . Hypertension   . Parkinson's disease (HCC)    lower extremity   . S/P TURP   . Urinary incontinence     Patient Active Problem [name]   Diagnosis Date Noted  . Bradycardia 05/17/2016  . Benign hypertensive heart disease without heart failure 12/23/2012  . Dyspnea on exertion 08/25/2012  . Pedal edema 08/25/2012  . Heart murmur 08/25/2012  . Right inguinal hernia 02/28/2011    Past Surgical History:  Procedure Laterality Date  . CHOLECYSTECTOMY    . HERNIA REPAIR    . HIP FUSION     lt  . KNEE SURGERY     rt knee  . PROSTATE SURGERY    . TONSILLECTOMY    . WRIST SURGERY      Prior to Admission medications   Medication Sig Start Date End Date Taking? Authorizing Provider  B Complex Vitamins (B-COMPLEX/B-12 PO) Take 1 capsule by mouth daily.     Historical Provider, MD  benazepril (LOTENSIN) 20 MG tablet Take 20 mg by mouth 2 (two) times daily.      Historical Provider, MD   calcium-vitamin D (OSCAL WITH D) 500-200 MG-UNIT per tablet Take 1 tablet by mouth daily.      Historical Provider, MD  carbidopa-levodopa (SINEMET IR) 25-100 MG tablet Take by mouth. 01/19/16 07/17/16  Historical Provider, MD  carvedilol (COREG) 12.5 MG tablet Take 12.5 mg by mouth 2 (two) times daily with a meal.    Historical Provider, MD  Cholecalciferol (VITAMIN D) 2000 UNITS CAPS Take 2,000 Units by mouth daily.     Historical Provider, MD  Coenzyme Q10 (CO Q-10) 200 MG CAPS Take 200 mg by mouth daily.    Historical Provider, MD  cyanocobalamin (V-R VITAMIN B-12) 500 MCG tablet Take by mouth.    Historical Provider, MD  fish oil-omega-3 fatty acids 1000 MG capsule Take 2 g by mouth daily.      Historical Provider, MD  HYDROcodone-acetaminophen (NORCO/VICODIN) 5-325 MG tablet Take 1 tablet by mouth every 4 (four) hours as needed for moderate pain. 11/29/15   Delorise RoyalsJonathan D Cuthriell, PA-C  hydrocortisone 2.5 % cream Apply 1 application topically daily as needed. (affected areas) 01/26/15   Historical Provider, MD  magnesium 30 MG tablet Take 30 mg by mouth 2 (two) times daily.      Historical Provider, MD  mirabegron ER (MYRBETRIQ) 50 MG TB24 Take 50 mg by mouth 2 (two) times daily.  Historical Provider, MD  OVER THE COUNTER MEDICATION Take 1 capsule by mouth 2 (two) times daily. (Juice plus capsules)    Historical Provider, MD  potassium chloride SA (K-DUR,KLOR-CON) 20 MEQ tablet Take 20 mEq by mouth 2 (two) times daily.    Historical Provider, MD  psyllium (METAMUCIL) 58.6 % powder Take 1 packet by mouth daily.    Historical Provider, MD  sertraline (ZOLOFT) 50 MG tablet Take by mouth. 01/19/16 07/17/16  Historical Provider, MD  tadalafil (CIALIS) 20 MG tablet Take 20 mg by mouth daily as needed. (erectile dysfunction) 01/02/15   Historical Provider, MD  timolol (TIMOPTIC) 0.5 % ophthalmic solution Place 1 drop into the left eye 2 (two) times daily.     Historical Provider, MD    Allergies Tetanus  toxoids; Atorvastatin; and Rosuvastatin  Family History  Problem Relation Age of Onset  . Healthy Mother   . Hypertension Father   . Ovarian cancer Sister   . Appendicitis Brother   . Hypertension Sister   . Heart disease Sister     Social History Social History  Substance Use Topics  . Smoking status: Former Smoker    Types: Cigars  . Smokeless tobacco: Never Used  . Alcohol use No    Review of Systems  Constitutional: Negative for fever. Cardiovascular: Negative for chest pain. Respiratory: Negative for shortness of breath. Gastrointestinal: Negative for abdominal pain, vomiting and diarrhea. Neurological: Negative for headaches. Positive for lower extremity weakness.   10-point ROS otherwise negative.  ____________________________________________   PHYSICAL EXAM:  VITAL SIGNS: ED Triage Vitals  Enc Vitals Group     BP 08/07/16 0622 (!) 168/84     Pulse Rate 08/07/16 0622 75     Resp 08/07/16 0622 14     Temp 08/07/16 0622 99.7 F (37.6 C)     Temp Source 08/07/16 0622 Oral     SpO2 08/07/16 0622 100 %     Weight 08/07/16 0623 180 lb (81.6 kg)     Height 08/07/16 0623 5\' 8"  (1.727 m)     Head Circumference --    Constitutional: Alert and oriented. Well appearing and in no distress. Eyes: Conjunctivae are normal. Normal extraocular movements. ENT   Head: Normocephalic and atraumatic.   Nose: No congestion/rhinnorhea.   Mouth/Throat: Mucous membranes are moist.   Neck: No stridor. Hematological/Lymphatic/Immunilogical: No cervical lymphadenopathy. Cardiovascular: Normal rate, regular rhythm.  No murmurs, rubs, or gallops.  Respiratory: Normal respiratory effort without tachypnea nor retractions. Breath sounds are clear and equal bilaterally. No wheezes/rales/rhonchi. Gastrointestinal: Soft and non tender. No rebound. No guarding.  Genitourinary: Deferred Musculoskeletal: Normal range of motion in all extremities. No lower extremity  edema. Neurologic:  Normal speech and language. No gross focal neurologic deficits are appreciated.  Skin:  Skin is warm, dry and intact. No rash noted. Psychiatric: Mood and affect are normal. Speech and behavior are normal. Patient exhibits appropriate insight and judgment.  ____________________________________________    LABS (pertinent positives/negatives)  Labs Reviewed  CBC WITH DIFFERENTIAL/PLATELET - Abnormal; Notable for the following:       Result Value   RBC 3.66 (*)    Hemoglobin 12.8 (*)    HCT 37.0 (*)    MCV 101.1 (*)    MCH 34.9 (*)    Platelets 62 (*)    Lymphs Abs 0.3 (*)    All other components within normal limits  COMPREHENSIVE METABOLIC PANEL - Abnormal; Notable for the following:    Glucose, Bld 117 (*)  BUN 26 (*)    Calcium 8.7 (*)    ALT 6 (*)    Total Bilirubin 1.5 (*)    GFR calc non Af Amer 53 (*)    All other components within normal limits  URINALYSIS, COMPLETE (UACMP) WITH MICROSCOPIC - Abnormal; Notable for the following:    Color, Urine YELLOW (*)    APPearance CLEAR (*)    Ketones, ur 5 (*)    Protein, ur 30 (*)    Nitrite POSITIVE (*)    Leukocytes, UA TRACE (*)    Bacteria, UA RARE (*)    Squamous Epithelial / LPF 0-5 (*)    All other components within normal limits  URINE CULTURE  TROPONIN I     ____________________________________________   EKG  I, Phineas Semen, attending physician, personally viewed and interpreted this EKG  EKG Time: 0622 Rate: 74 Rhythm: normal sinus rhythm Axis: normal Intervals: qtc 480 QRS: IVCD ST changes: no st elevation Impression: abnormal ekg   ____________________________________________    RADIOLOGY  None   ____________________________________________   PROCEDURES  Procedures  ____________________________________________   INITIAL IMPRESSION / ASSESSMENT AND PLAN / ED COURSE  Pertinent labs & imaging results that were available during my care of the patient were  reviewed by me and considered in my medical decision making (see chart for details).  Patient presented after fall and concern for increased weakness. Work up is consistent with a UTI. I think this likely explains the patient's weakness. No evidence of any significant traumatic injury on exam. Patient was given dose of IV antibiotics in the ED and IVFs. He was able to get up and walk with less weakness after fluids. Will discharge with continued antibiotics.   ____________________________________________   FINAL CLINICAL IMPRESSION(S) / ED DIAGNOSES  Final diagnoses:  Fall, initial encounter  Urinary tract infection without hematuria, site unspecified     Note: This dictation was prepared with Dragon dictation. Any transcriptional errors that result from this process are unintentional     Phineas Semen, MD 08/07/16 [phone]

## 2016-08-07 NOTE — Discharge Instructions (Signed)
Please seek medical attention for any high fevers, chest pain, shortness of breath, change in behavior, persistent vomiting, bloody stool or any other new or concerning symptoms.  

## 2016-08-09 LAB — URINE CULTURE: Culture: >=100000 — AB

## 2016-12-02 ENCOUNTER — Ambulatory Visit: Payer: Medicare Other | Attending: Internal Medicine | Admitting: Physical Therapy

## 2016-12-02 ENCOUNTER — Encounter: Payer: Self-pay | Admitting: Physical Therapy

## 2016-12-02 DIAGNOSIS — M6281 Muscle weakness (generalized): Secondary | ICD-10-CM | POA: Insufficient documentation

## 2016-12-02 DIAGNOSIS — R262 Difficulty in walking, not elsewhere classified: Secondary | ICD-10-CM | POA: Insufficient documentation

## 2016-12-02 NOTE — Therapy (Addendum)
[facility] MAIN [facility] SERVICES [address] [place], [place], [address] Phone: [phone]   Fax:  [phone]  Physical Therapy Evaluation  Patient Details  Name: Perry [name] MRN: [id number] Date of Birth: May 02, 1929 Referring Provider: Curtis Sites III  Encounter Date: 12/02/2016      PT End of Session - 12/02/16 1612    Visit Number 1   Number of Visits 17   Date for PT Re-Evaluation Feb 24, 2016   Authorization Type g codes1/10   PT Start Time 0400   PT Stop Time 0500   PT Time Calculation (min) 60 min   Equipment Utilized During Treatment Gait belt   Activity Tolerance Patient limited by fatigue;Patient limited by pain   Behavior During Therapy WFL for tasks assessed/performed      Past Medical History:  Diagnosis Date  . Arthritis   . Heart attack (HCC)   . Hyperlipidemia   . Hypertension   . Parkinson's disease (HCC)    lower extremity   . S/P TURP   . Urinary incontinence     Past Surgical History:  Procedure Laterality Date  . CHOLECYSTECTOMY    . HERNIA REPAIR    . HIP FUSION     lt  . KNEE SURGERY     rt knee  . PROSTATE SURGERY    . TONSILLECTOMY    . WRIST SURGERY      There were no vitals filed for this visit.       Subjective Assessment - 12/02/16 1604    Subjective Patient wants to be able to get up from the floor when he does his exercises. He has difficulty getting his legs into bed. He has had a fall and was not able to get up  From the floor.    Patient is accompained by: Family member   Pertinent History  CAD, depression wiht anxiety, diverticulosis, hyperlipidemia, HTN, Hyperthyroidism, leg edama, OA, palpatiions, BLE hip replacement, R TKR, He fell the end of december 2017, he went to the hospital and found out that he had a bladder infection. He has needed to call EMS to help him get up after a fall. He walks with a RW and also uses a spc.    Limitations Walking;Standing   How long can you  sit comfortably? unlimited   How long can you stand comfortably? 10 mins   How long can you walk comfortably? 10 mins   Patient Stated Goals to be able to get up off the floor   Currently in Pain? No/denies   Pain Score 0-No pain   Multiple Pain Sites No            [facility] PT Assessment - 12/02/16 1607      Assessment   Medical Diagnosis Atypical Parkinsonism   Referring Provider Curtis Sites III   Next MD Visit july 27,2018   Prior Therapy no     Precautions   Precautions Fall     Restrictions   Weight Bearing Restrictions No     Balance Screen   Has the patient fallen in the past 6 months Yes   How many times? --  1   Has the patient had a decrease in activity level because of a fear of falling?  Yes     Home Environment   Living Environment Private residence   Living Arrangements Spouse/significant other   Available Help at Discharge Family   Type of Home Apartment   Home  Access Stairs to enter   Entrance Stairs-Number of Steps --  1   Entrance Stairs-Rails None   Home Equipment --  single point cane     Prior Function   Level of Independence Independent   Vocation Retired     Copy Status Within Functional Limits for tasks assessed      PAIN: no reports of pain  POSTURE: flexed posture   PROM/AROM: WFL  STRENGTH:  Graded on a 0-5 scale Muscle Group Left Right                          Hip Flex -4/5 4/5  Hip Abd -4/5 4/5  Hip Add -4/5 4/5  Hip Ext nt nt  Hip IR/ER    Knee Flex 4/5 4/5  Knee Ext 4/5 4/5  Ankle DF 4/5 4/5  Ankle PF 3/5 3/5   SENSATION: WFL   FUNCTIONAL MOBILITY: slow and guarded and difficulty with raising up his legs onto the mat   BALANCE:  poor dynamic standing balance and fair static standing  unable to single leg stand or tandem stand    GAIT: Patient ambulates with SPC and slow gait speed with unsteady path and wide base of support  OUTCOME MEASURES: TEST Outcome Interpretation  5  times sit<>stand 30.78 sec >60 yo, >15 sec indicates increased risk for falls  10 meter walk test    .47             m/s <1.0 m/s indicates increased risk for falls; limited community ambulator  Timed up and Go    25.29             sec <14 sec indicates increased risk for falls  6 minute walk test      325          Feet 1000 feet is community ambulator                 Treatment: hooklying marching x 20 x 1 SLR BLE x 20  Bridging x 20  No reports of pain.                   PT Education - 12/02/16 1611    Education provided Yes   Education Details saftey with mobility   Person(s) Educated Patient   Methods Explanation   Comprehension Verbalized understanding             PT Long Term Goals - 12/02/16 1659      PT LONG TERM GOAL #1   Title Patient will be independent in home exercise program to improve strength/mobility for better functional independence with ADLs   Time 8   Period Weeks   Status New     PT LONG TERM GOAL #2   Title Patient will increase six minute walk test distance to >1000 for progression to community ambulator and improve gait ability   Baseline 325 ft   Time 8   Period Weeks   Status New     PT LONG TERM GOAL #3   Title Patient will increase 10 meter walk test to >1.45m/s as to improve gait speed for better community ambulation and to reduce fall risk   Baseline .47 sec   Time 8   Period Weeks   Status New     PT LONG TERM GOAL #4   Title Patient will reduce timed up and go to <11 seconds to reduce  fall risk and demonstrate improved transfer/gait ability.   Baseline 25.29 sec   Time 8   Period Weeks   Status New     PT LONG TERM GOAL #5   Title Patient (> 4 years old) will complete five times sit to stand test in < 15 seconds indicating an increased LE strength and improved balance.   Baseline 30.78 sec   Time 8   Period Weeks   Status New               Plan - 12/02/16 1613    Clinical Impression Statement  Patient has dx of Atypical Parkinsonism and has hx of a fall. He has poor mobility including transfers and bed mobility and has poor safety with mobility. He has decreased strength BLE with LLE weaker than RLE. He has increased risk of falls indicated by outcome measures including TUG, 10 MW tes, 5 x sit to stand and 6 MW test. He will benefit from skilled PT to improve strength and mobility and decrease his fall risk.    Clinical Impairments Affecting Rehab Potential This patient presents with 2 personal factors/ comorbidities including current situation and hx of a fall., and 3   body elements including body structures and functions, activity limitations and or participation restrictions : decreased strength abiity to transfer and ambulates with slow gait speed and, decreased mobility.  Patient's condition is evolving. Marland Kitchen    PT Frequency 2x / week   PT Duration 8 weeks   PT Treatment/Interventions Therapeutic exercise;Balance training;Therapeutic activities;Gait training;Stair training;Neuromuscular re-education;Functional mobility training   PT Next Visit Plan strengthening and balance   PT Home Exercise Plan supine therex given 12/02/16   Consulted and Agree with Plan of Care Patient      Patient will benefit from skilled therapeutic intervention in order to improve the following deficits and impairments:  Abnormal gait, Decreased activity tolerance, Decreased balance, Difficulty walking, Decreased strength, Impaired flexibility, Pain, Decreased mobility, Decreased endurance, Decreased safety awareness  Visit Diagnosis: Difficulty in walking, not elsewhere classified - Plan: PT plan of care cert/re-cert  Muscle weakness (generalized) - Plan: PT plan of care cert/re-cert      G-Codes - Dec 15, 2016 8295    Functional Assessment Tool Used (Outpatient Only) TUG, 10 MW, 6 MW, 5 x sit to  stand   Functional Limitation Mobility: Walking and moving around   Mobility: Walking and Moving Around Current  Status [phone]) At least 60 percent but less than 80 percent impaired, limited or restricted   Mobility: Walking and Moving Around Goal Status [phone]) At least 40 percent but less than 60 percent impaired, limited or restricted       Problem List Patient Active Problem List   Diagnosis Date Noted  . Bradycardia 05/17/2016  . Benign hypertensive heart disease without heart failure 12/23/2012  . Dyspnea on exertion 08/25/2012  . Pedal edema 08/25/2012  . Heart murmur 08/25/2012  . Right inguinal hernia 02/28/2011   [name], PT, DPT [place], Barkley Bruns S 15-Dec-2016, 9:38 AM  [facility] MAIN [facility] SERVICES [address] [place], [place], [address] Phone: [phone]   Fax:  [phone]  Name: Perry [name] MRN: [id number] Date of Birth: 06/08/1929

## 2016-12-04 ENCOUNTER — Ambulatory Visit: Payer: Medicare Other | Admitting: Physical Therapy

## 2016-12-10 ENCOUNTER — Encounter: Payer: Self-pay | Admitting: Physical Therapy

## 2016-12-10 ENCOUNTER — Ambulatory Visit: Payer: Medicare Other | Attending: Internal Medicine | Admitting: Physical Therapy

## 2016-12-10 DIAGNOSIS — R262 Difficulty in walking, not elsewhere classified: Secondary | ICD-10-CM | POA: Insufficient documentation

## 2016-12-10 DIAGNOSIS — M6281 Muscle weakness (generalized): Secondary | ICD-10-CM

## 2016-12-10 NOTE — Patient Instructions (Addendum)
Abduction: Clam (Eccentric) - Side-Lying    Lie on side with knees bent. Lift top knee, keeping feet together. Keep trunk steady. Slowly lower for 3-5 seconds. ___ reps per set, ___ sets per day, ___ days per week. Add ___ lbs when you achieve ___ repetitions.  http://ecce.exer.us/64   Copyright  VHI. All rights reserved.  Abduction: Side Leg Lift (Eccentric) - Side-Lying    Lie on side. Lift top leg slightly higher than shoulder level. Keep top leg straight with body, toes pointing forward. Slowly lower for 3-5 seconds. ___ reps per set, ___ sets per day, ___ days per week. Add ___ lbs when you achieve ___ repetitions.  http://ecce.exer.us/62   Copyright  VHI. All rights reserved.  Abductor Strength: Bridge Pose (Strap)    Make strap wide enough to brace knees at hip width. Press into strap with knees. Hold for ____ breaths. Repeat ____ times.  Copyright  VHI. All rights reserved.  Abdominals: Single Leg Bend    Lying on back with legs out straight, inhale, then exhale while slowly sliding heel along floor toward buttocks. Slowly return to starting position. Repeat ____ times each leg per set. Do ____ sets per session. Do ____ sessions per day.  Copyright  VHI. All rights reserved.  Alternating Step    Take alternating steps as quickly as possible. Repeat ____ times. Do ____ sessions per day.  http://gt2.exer.us/493   Copyright  VHI. All rights reserved.

## 2016-12-10 NOTE — Therapy (Addendum)
[place] [facility] MAIN [facility] SERVICES [address] [place], [place], [address] Phone: [phone]   Fax:  [phone]  Physical Therapy Treatment  Patient Details  Name: Perry [name] MRN: [id number] Date of Birth: Jan 19, 1929 Referring Provider: Curtis Sites III  Encounter Date: 12/10/2016      PT End of Session - 12/10/16 1728    Visit Number 2   Number of Visits 17   Date for PT Re-Evaluation 2016/01/31   Authorization Type g codes2/10   PT Start Time 0510   PT Stop Time 0548   PT Time Calculation (min) 38 min   Equipment Utilized During Treatment Gait belt   Activity Tolerance Patient limited by fatigue;Patient limited by pain   Behavior During Therapy [facility] for tasks assessed/performed      Past Medical History:  Diagnosis Date  . Arthritis   . Heart attack (HCC)   . Hyperlipidemia   . Hypertension   . Parkinson's disease (HCC)    lower extremity   . S/P TURP   . Urinary incontinence     Past Surgical History:  Procedure Laterality Date  . CHOLECYSTECTOMY    . HERNIA REPAIR    . HIP FUSION     lt  . KNEE SURGERY     rt knee  . PROSTATE SURGERY    . TONSILLECTOMY    . WRIST SURGERY      There were no vitals filed for this visit.      Subjective Assessment - 12/10/16 1727    Subjective Patient wants to be able to get up from the floor when he does his exercises. Patient is performing his HEP.    Patient is accompained by: Family member   Pertinent History  CAD, depression wiht anxiety, diverticulosis, hyperlipidemia, HTN, Hyperthyroidism, leg edama, OA, palpatiions, BLE hip replacement, R TKR, He fell the end of december 2017, he went to the hospital and found out that he had a bladder infection.    Limitations Walking;Standing   How long can you sit comfortably? unlimited   How long can you stand comfortably? 10 mins   How long can you walk comfortably? 10 mins   Patient Stated Goals to be able to get up off the floor    Currently in Pain? No/denies   Pain Score 0-No pain     Treatment:  Therapeutic exercise :  TM walking for  3 minutes and . 4 miles / hour with UE support Supine  hip abd   x 15 bilaterally with cues for correct positions side stepping left and right in parallel bars 10 feet x 3 bilaterally with cues for correct positions step ups from floor to 6 inch stool x 20 bilateral bilaterally with cues for correct positions sit to stand x 10 Bridging x 15 Marching hooklying position x 15 bilaterally with cues for correct positions Heel slides x 15  Supine SLR x 15 bilaterally with cues for correct position and keeping knee straight.  All hand outs for HEP instructed to perform 15 repetitions x 2 times/ day                          PT Education - 12/10/16 1727    Education provided Yes   Education Details safety with mobility   Person(s) Educated Patient   Methods Explanation   Comprehension Verbalized understanding             PT Long  Term Goals - 12/02/16 1659      PT LONG TERM GOAL #1   Title Patient will be independent in home exercise program to improve strength/mobility for better functional independence with ADLs   Time 8   Period Weeks   Status New     PT LONG TERM GOAL #2   Title Patient will increase six minute walk test distance to >1000 for progression to community ambulator and improve gait ability   Baseline 325 ft   Time 8   Period Weeks   Status New     PT LONG TERM GOAL #3   Title Patient will increase 10 meter walk test to >1.55m/s as to improve gait speed for better community ambulation and to reduce fall risk   Baseline .47 sec   Time 8   Period Weeks   Status New     PT LONG TERM GOAL #4   Title Patient will reduce timed up and go to <11 seconds to reduce fall risk and demonstrate improved transfer/gait ability.   Baseline 25.29 sec   Time 8   Period Weeks   Status New     PT LONG TERM GOAL #5   Title Patient (> 36 years  old) will complete five times sit to stand test in < 15 seconds indicating an increased LE strength and improved balance.   Baseline 30.78 sec   Time 8   Period Weeks   Status New               Plan - 12/10/16 1729    Clinical Impression Statement Patient performs beginning exercises in supine and sitting; open chain and closed chain.  He has poor mobility including transfers and bed mobility and has poor safety with mobility. He has decreased strength BLE with LLE weaker than RLE.  He will benefit from skilled PT to improve strength and mobility and decrease his fall risk   Clinical Impairments Affecting Rehab Potential This patient presents with 2 personal factors/ comorbidities including current situation and hx of a fall., and 3   body elements including body structures and functions, activity limitations and or participation restrictions : decreased strength abiity to transfer and ambulates with slow gait speed and, decreased mobility.  Patient's condition is evolving. Marland Kitchen    PT Frequency 2x / week   PT Duration 8 weeks   PT Treatment/Interventions Therapeutic exercise;Balance training;Therapeutic activities;Gait training;Stair training;Neuromuscular re-education;Functional mobility training   PT Next Visit Plan strengthening and balance   PT Home Exercise Plan supine therex given 12/02/16   Consulted and Agree with Plan of Care Patient      Patient will benefit from skilled therapeutic intervention in order to improve the following deficits and impairments:  Abnormal gait, Decreased activity tolerance, Decreased balance, Difficulty walking, Decreased strength, Impaired flexibility, Pain, Decreased mobility, Decreased endurance, Decreased safety awareness  Visit Diagnosis: Difficulty in walking, not elsewhere classified  Muscle weakness (generalized)     Problem List Patient Active Problem List   Diagnosis Date Noted  . Bradycardia 05/17/2016  . Benign hypertensive heart  disease without heart failure 12/23/2012  . Dyspnea on exertion 08/25/2012  . Pedal edema 08/25/2012  . Heart murmur 08/25/2012  . Right inguinal hernia 02/28/2011   [name], PT, DPT [place], Barkley Bruns S 12/10/2016, 5:32 PM  [place] [facility] MAIN [facility] SERVICES [address] [place], [place], [address] Phone: [phone]   Fax:  [phone]  Name: Perry [name] MRN: [id number] Date  of Birth: [birth year]

## 2016-12-12 ENCOUNTER — Ambulatory Visit: Payer: Medicare Other | Admitting: Physical Therapy

## 2016-12-12 ENCOUNTER — Encounter: Payer: Self-pay | Admitting: Physical Therapy

## 2016-12-12 DIAGNOSIS — M6281 Muscle weakness (generalized): Secondary | ICD-10-CM

## 2016-12-12 DIAGNOSIS — R262 Difficulty in walking, not elsewhere classified: Secondary | ICD-10-CM | POA: Diagnosis not present

## 2016-12-12 NOTE — Therapy (Signed)
[place] [facility] MAIN [facility] SERVICES [address] [place], [place], [address] Phone: [phone]   Fax:  [phone]  Physical Therapy Treatment  Patient Details  Name: Perry [name] MRN: [id number] Date of Birth: 1928-12-15 Referring Provider: Curtis Sites III  Encounter Date: 12/12/2016      PT End of Session - 12/12/16 1329    Visit Number 3   Number of Visits 17   Date for PT Re-Evaluation 02/18/16   Authorization Type g codes 11-10-22   PT Start Time 0105   PT Stop Time 0145   PT Time Calculation (min) 40 min   Equipment Utilized During Treatment Gait belt   Activity Tolerance Patient limited by fatigue;Patient limited by pain   Behavior During Therapy [facility] for tasks assessed/performed      Past Medical History:  Diagnosis Date  . Arthritis   . Heart attack (HCC)   . Hyperlipidemia   . Hypertension   . Parkinson's disease (HCC)    lower extremity   . S/P TURP   . Urinary incontinence     Past Surgical History:  Procedure Laterality Date  . CHOLECYSTECTOMY    . HERNIA REPAIR    . HIP FUSION     lt  . KNEE SURGERY     rt knee  . PROSTATE SURGERY    . TONSILLECTOMY    . WRIST SURGERY      There were no vitals filed for this visit.      Subjective Assessment - 12/12/16 1322    Subjective Patient wants more exercises for his HEP.    Patient is accompained by: Family member   Pertinent History  CAD, depression wiht anxiety, diverticulosis, hyperlipidemia, HTN, Hyperthyroidism, leg edama, OA, palpatiions, BLE hip replacement, R TKR, He fell the end of december 2017, he went to the hospital and found out that he had a bladder infection.    Limitations Walking;Standing   How long can you sit comfortably? unlimited   How long can you stand comfortably? 10 mins   How long can you walk comfortably? 10 mins   Patient Stated Goals to be able to get up off the floor   Currently in Pain? No/denies   Pain Score 0-No pain   Multiple  Pain Sites No       Therapeutic exercise: Nu- step x 5 mins  Step ups to stool 6 inch x 15 x 2 Eccentric step downs x 5 x 4 sets  Standing hip abd x 15 BLE Standing hip ext x 15 BLE Standing hip flex x 15 BLE Squats x 15 Heel raises x 20  TM walking . 6 miles/ hour x 1 min 30 sec  X 3 repetitions Marching in parallel bars x 20 x 2  All exercises with BUE support, cues for correct technique and cues for posture correction All hand outs for HEP instructed to perform 15 repetitions x 2 times/ day                            PT Education - 12/12/16 1323    Education provided Yes   Education Details Patient needs cues for picking up his feet.   Person(s) Educated Patient   Methods Explanation   Comprehension Verbalized understanding             PT Long Term Goals - 12/02/16 1659      PT LONG TERM GOAL #1  Title Patient will be independent in home exercise program to improve strength/mobility for better functional independence with ADLs   Time 8   Period Weeks   Status New     PT LONG TERM GOAL #2   Title Patient will increase six minute walk test distance to >1000 for progression to community ambulator and improve gait ability   Baseline 325 ft   Time 8   Period Weeks   Status New     PT LONG TERM GOAL #3   Title Patient will increase 10 meter walk test to >1.6431m/s as to improve gait speed for better community ambulation and to reduce fall risk   Baseline .47 sec   Time 8   Period Weeks   Status New     PT LONG TERM GOAL #4   Title Patient will reduce timed up and go to <11 seconds to reduce fall risk and demonstrate improved transfer/gait ability.   Baseline 25.29 sec   Time 8   Period Weeks   Status New     PT LONG TERM GOAL #5   Title Patient (> 81 years old) will complete five times sit to stand test in < 15 seconds indicating an increased LE strength and improved balance.   Baseline 30.78 sec   Time 8   Period Weeks   Status New                Plan - 12/12/16 1329    Clinical Impression Statement Patient performs beginning exercises in standing: open chain and closed chain and standing balance exercises. He has poor mobility including transfers and bed mobility and has poor safety with mobility. He has decreased strength -3/5 BLE with LLE weaker than RLE. He will benefit from skilled PT to improve strength and mobility and decrease his fall risk   Clinical Impairments Affecting Rehab Potential This patient presents with 2 personal factors/ comorbidities including current situation and hx of a fall., and 3   body elements including body structures and functions, activity limitations and or participation restrictions : decreased strength abiity to transfer and ambulates with slow gait speed and, decreased mobility.  Patient's condition is evolving. Marland Kitchen.    PT Frequency 2x / week   PT Duration 8 weeks   PT Treatment/Interventions Therapeutic exercise;Balance training;Therapeutic activities;Gait training;Stair training;Neuromuscular re-education;Functional mobility training   PT Next Visit Plan strengthening and balance   PT Home Exercise Plan supine therex given 12/02/16   Consulted and Agree with Plan of Care Patient      Patient will benefit from skilled therapeutic intervention in order to improve the following deficits and impairments:  Abnormal gait, Decreased activity tolerance, Decreased balance, Difficulty walking, Decreased strength, Impaired flexibility, Pain, Decreased mobility, Decreased endurance, Decreased safety awareness  Visit Diagnosis: Difficulty in walking, not elsewhere classified  Muscle weakness (generalized)     Problem List Patient Active Problem List   Diagnosis Date Noted  . Bradycardia 05/17/2016  . Benign hypertensive heart disease without heart failure 12/23/2012  . Dyspnea on exertion 08/25/2012  . Pedal edema 08/25/2012  . Heart murmur 08/25/2012  . Right inguinal hernia  02/28/2011   Ezekiel InaKristine S Patton Swisher, PT, DPT [place], [place] 12/12/2016, 1:38 PM  [place] [facility] MAIN [facility] [address] , [place], [address] Phone: [phone]   Fax:  [phone]  Name: Perry [name] MRN: [id number] Date of Birth: 05/20/1929

## 2016-12-12 NOTE — Patient Instructions (Addendum)
Ankle Plantar Flexion / Dorsiflexion, Standing    Stand while holding a stable object. Rise up on toes. Then rock back on heels. Hold each position ___ seconds. Repeat ___ times per session. Do ___ sessions per day.  Copyright  VHI. All rights reserved.  (Home) Ankle: Mobilization - Squat With Anterior Glide of Tibia    Facing anchor, strap around left ankle, squat, keeping knees over feet. Repeat ____ times per set. Do ____ sets per session. Do ____ sessions per week. Use ____ lb weights.  Copyright  VHI. All rights reserved.  Hip Flexion / Knee Extension: Straight-Leg Raise (Eccentric)    Lie on back. Lift leg with knee straight. Slowly lower leg for 3-5 seconds. ___ reps per set, ___ sets per day, ___ days per week. Lower like elevator, stopping at each floor. Add ___ lbs when you achieve ___ repetitions. Rest on elbows. Rest on straight arms.  Copyright  VHI. All rights reserved.  Hip Extension: Standing (Single Leg)    In shoulder width stance, anchor tubing under one foot. Twist and put around other ankle. Pull same leg back, keeping knee nearly straight. Repeat __ times per set. Repeat with other leg. Do __ sets per session. Do __ sessions per week.  http://tub.exer.us/193   Copyright  VHI. All rights reserved.  Hip Abduction: Standing - Straight Leg    In shoulder width stance, tubing around ankles, pull leg out to side, keeping knee straight. Repeat __ times per set. Repeat with other leg. Do __ sets per session. Do __ sessions per week.  http://tub.exer.us/207   Copyright  VHI. All rights reserved.  Hip Abduction: Side-Lying (Single Leg)    Lie on side with knees bent, tubing around thighs just above knees. Raise top leg, keeping knee bent. Repeat __ times per set. Repeat on other side. Do __ sets per session. Do __ sessions per week.  http://tub.exer.us/43   Copyright  VHI. All rights reserved.

## 2016-12-16 ENCOUNTER — Encounter: Payer: Self-pay | Admitting: Physical Therapy

## 2016-12-16 ENCOUNTER — Ambulatory Visit: Payer: Medicare Other | Admitting: Physical Therapy

## 2016-12-16 DIAGNOSIS — M6281 Muscle weakness (generalized): Secondary | ICD-10-CM

## 2016-12-16 DIAGNOSIS — R262 Difficulty in walking, not elsewhere classified: Secondary | ICD-10-CM | POA: Diagnosis not present

## 2016-12-16 NOTE — Therapy (Addendum)
[place] [facility] MAIN [facility] SERVICES [address] [place], [place], [address] Phone: [phone]   Fax:  [phone]  Physical Therapy Treatment  Patient Details  Name: Perry [name] MRN: [id number] Date of Birth: 10/22/1928 Referring Provider: Curtis Sites III  Encounter Date: 12/16/2016      PT End of Session - 12/16/16 1453    Visit Number 4   Number of Visits 17   Date for PT Re-Evaluation 02-11-2016   Authorization Type g codes 12/04/2022   PT Start Time 0246   PT Stop Time 0330   PT Time Calculation (min) 44 min   Equipment Utilized During Treatment Gait belt   Activity Tolerance Patient limited by fatigue;Patient limited by pain   Behavior During Therapy [facility] for tasks assessed/performed      Past Medical History:  Diagnosis Date  . Arthritis   . Heart attack (HCC)   . Hyperlipidemia   . Hypertension   . Parkinson's disease (HCC)    lower extremity   . S/P TURP   . Urinary incontinence     Past Surgical History:  Procedure Laterality Date  . CHOLECYSTECTOMY    . HERNIA REPAIR    . HIP FUSION     lt  . KNEE SURGERY     rt knee  . PROSTATE SURGERY    . TONSILLECTOMY    . WRIST SURGERY      There were no vitals filed for this visit.      Subjective Assessment - 12/16/16 1452    Subjective Patient is doing his exercises. He has no complaints.    Currently in Pain? No/denies   Pain Score 0-No pain   Multiple Pain Sites No      Therapeutic exercise: Nu-step x 5 minutes L1 TM . 7 miles/ hour x 2 1/2 minutes; 20 sec, 1  Min and 10 sec Step ups to 6 inch stool x 20 Marching x 15 BLE Eccentric step downs x 5 reps x 4  Standing on blue foam with trunk rotation x 10  Stepping over foam roller fwd/ bwd and side to side x 10 Side stepping with RTB x 3 lengths of the parallel bars Patient needs CGA for all above exercises and cues for posture and correct head position.  Patient has fatigue with exercises and has poor  posture as he gets more fatigued.                           PT Education - 12/16/16 1453    Education provided Yes   Education Details Patient needs cues for safety   Person(s) Educated Patient   Methods Explanation   Comprehension Verbalized understanding             PT Long Term Goals - 12/02/16 1659      PT LONG TERM GOAL #1   Title Patient will be independent in home exercise program to improve strength/mobility for better functional independence with ADLs   Time 8   Period Weeks   Status New     PT LONG TERM GOAL #2   Title Patient will increase six minute walk test distance to >1000 for progression to community ambulator and improve gait ability   Baseline 325 ft   Time 8   Period Weeks   Status New     PT LONG TERM GOAL #3   Title Patient will increase 10 meter walk test  to >1.9320m/s as to improve gait speed for better community ambulation and to reduce fall risk   Baseline .47 sec   Time 8   Period Weeks   Status New     PT LONG TERM GOAL #4   Title Patient will reduce timed up and go to <11 seconds to reduce fall risk and demonstrate improved transfer/gait ability.   Baseline 25.29 sec   Time 8   Period Weeks   Status New     PT LONG TERM GOAL #5   Title Patient (> 81 years old) will complete five times sit to stand test in < 15 seconds indicating an increased LE strength and improved balance.   Baseline 30.78 sec   Time 8   Period Weeks   Status New               Plan - 12/16/16 1603    Clinical Impression Statement Patient performs beginning exercises in standing: open chain and closed chain and standing balance exercises with cues for posture correction and technique. He has weakness in BLE knees and hips with poor posture during standing exercises.  He has poor mobility including transfers and bed mobility and has poor safety with mobility.Patient was educated in safety with lifting his feet and having a place for UE  support with exercise and during transfers.  He has decreased strength -3/5 BLE with LLE weaker than RLE. He will continue to benefit from skilled PT to improve strength and mobility and decrease his fall risk.   Clinical Impairments Affecting Rehab Potential This patient presents with 2 personal factors/ comorbidities including current situation and hx of a fall., and 3   body elements including body structures and functions, activity limitations and or participation restrictions : decreased strength abiity to transfer and ambulates with slow gait speed and, decreased mobility.  Patient's condition is evolving. Marland Kitchen.    PT Frequency 2x / week   PT Duration 8 weeks   PT Treatment/Interventions Therapeutic exercise;Balance training;Therapeutic activities;Gait training;Stair training;Neuromuscular re-education;Functional mobility training   PT Next Visit Plan strengthening and balance   PT Home Exercise Plan supine therex given 12/02/16   Consulted and Agree with Plan of Care Patient      Patient will benefit from skilled therapeutic intervention in order to improve the following deficits and impairments:  Abnormal gait, Decreased activity tolerance, Decreased balance, Difficulty walking, Decreased strength, Impaired flexibility, Pain, Decreased mobility, Decreased endurance, Decreased safety awareness  Visit Diagnosis: Difficulty in walking, not elsewhere classified  Muscle weakness (generalized)     Problem List Patient Active Problem List   Diagnosis Date Noted  . Bradycardia 05/17/2016  . Benign hypertensive heart disease without heart failure 12/23/2012  . Dyspnea on exertion 08/25/2012  . Pedal edema 08/25/2012  . Heart murmur 08/25/2012  . Right inguinal hernia 02/28/2011  Ezekiel InaKristine S Makayla Lanter, PT, DPT [place], [place] 12/16/2016, 4:08 PM  [place] [facility] MA[facility] [address] [place], [place], [address] Phone: [phone]   Fax:   [phone]  Name: Perry [name] MRN: [id number] Date of Birth: 11/09/1928

## 2016-12-17 ENCOUNTER — Ambulatory Visit: Payer: Medicare Other | Admitting: Cardiovascular Disease

## 2016-12-18 ENCOUNTER — Ambulatory Visit: Payer: Medicare Other | Admitting: Physical Therapy

## 2016-12-18 ENCOUNTER — Encounter: Payer: Self-pay | Admitting: Physical Therapy

## 2016-12-18 VITALS — BP 166/72

## 2016-12-18 DIAGNOSIS — R262 Difficulty in walking, not elsewhere classified: Secondary | ICD-10-CM | POA: Diagnosis not present

## 2016-12-18 DIAGNOSIS — M6281 Muscle weakness (generalized): Secondary | ICD-10-CM

## 2016-12-18 NOTE — Therapy (Signed)
[place] [facility] MAIN [facility] SERVICES [address] [place], [place], [address] Phone: [phone]   Fax:  [phone]  Physical Therapy Treatment  Patient Details  Name: Perry [name] MRN: [id number] Date of Birth: May 29, 1929 Referring Provider: Curtis Sites III  Encounter Date: 12/18/2016      PT End of Session - 12/18/16 1348    Visit Number 5   Number of Visits 17   Date for PT Re-Evaluation 02-03-2016   Authorization Type g codes 5/10   PT Start Time 1347   PT Stop Time 1430   PT Time Calculation (min) 43 min   Equipment Utilized During Treatment Gait belt   Activity Tolerance Patient limited by fatigue;Patient limited by pain   Behavior During Therapy WFL for tasks assessed/performed      Past Medical History:  Diagnosis Date  . Arthritis   . Heart attack (HCC)   . Hyperlipidemia   . Hypertension   . Parkinson's disease (HCC)    lower extremity   . S/P TURP   . Urinary incontinence     Past Surgical History:  Procedure Laterality Date  . CHOLECYSTECTOMY    . HERNIA REPAIR    . HIP FUSION     lt  . KNEE SURGERY     rt knee  . PROSTATE SURGERY    . TONSILLECTOMY    . WRIST SURGERY      Vitals:   12/18/16 1353  BP: (!) 166/72        Subjective Assessment - 12/18/16 1353    Subjective Pt reports he is doing well this date. He has been doing his HEP without questions or concerns.  He denies falls.   Currently in Pain? Yes   Pain Score 4    Pain Location Leg   Pain Orientation Left;Right   Pain Descriptors / Indicators Aching   Pain Type Chronic pain   Pain Onset More than a month ago   Pain Frequency Constant   Multiple Pain Sites No       Therapeutic Exercise:  Sit<>stand x5 without UE support and cues to scoot to edge of chair prior to standing. Fatigue noted at end of set.  Step ups to 6 inch stool x 20  Lateral stepping over  foam roll x20 each direction  Taking step forward with R foot and raising LUE  overhead x15 and repeated with L foot and RUE x15. Cues to exaggerate big step and reaching as high as he can.  Rhomberg stance with ball toss to L and then to R x10 each direction  Lateral stepping up and over airex pad x25 each direction  Marching on airex pad x20 each LE with 1UE supported  Marching in standing with RTB resistance x10 each LE  Cues throughout session for upright posture and forward gaze         PT Education - 12/18/16 1348    Education provided Yes   Education Details Exercise technique; posture   Person(s) Educated Patient   Methods Explanation;Demonstration;Verbal cues   Comprehension Verbalized understanding;Returned demonstration;Need further instruction             PT Long Term Goals - 12/02/16 1659      PT LONG TERM GOAL #1   Title Patient will be independent in home exercise program to improve strength/mobility for better functional independence with ADLs   Time 8   Period Weeks   Status New     PT LONG  TERM GOAL #2   Title Patient will increase six minute walk test distance to >1000 for progression to community ambulator and improve gait ability   Baseline 325 ft   Time 8   Period Weeks   Status New     PT LONG TERM GOAL #3   Title Patient will increase 10 meter walk test to >1.4972m/s as to improve gait speed for better community ambulation and to reduce fall risk   Baseline .47 sec   Time 8   Period Weeks   Status New     PT LONG TERM GOAL #4   Title Patient will reduce timed up and go to <11 seconds to reduce fall risk and demonstrate improved transfer/gait ability.   Baseline 25.29 sec   Time 8   Period Weeks   Status New     PT LONG TERM GOAL #5   Title Patient (> 81 years old) will complete five times sit to stand test in < 15 seconds indicating an increased LE strength and improved balance.   Baseline 30.78 sec   Time 8   Period Weeks   Status New               Plan - 12/18/16 1420    Clinical Impression Statement  Pt presents shuffling feet as he ambulates to parallel bars at start of session.  He requires cues for upright posture and forward gaze with all exercises but tolerated all exercises well.  Pt performed well with cues to exaggerate motions with stepping activity.  He will benefit from continued skilled PT interventions for improved posture, strength, balance, and QOL.   Clinical Impairments Affecting Rehab Potential This patient presents with 2 personal factors/ comorbidities including current situation and hx of a fall., and 3   body elements including body structures and functions, activity limitations and or participation restrictions : decreased strength abiity to transfer and ambulates with slow gait speed and, decreased mobility.  Patient's condition is evolving. Marland Kitchen.    PT Frequency 2x / week   PT Duration 8 weeks   PT Treatment/Interventions Therapeutic exercise;Balance training;Therapeutic activities;Gait training;Stair training;Neuromuscular re-education;Functional mobility training   PT Next Visit Plan strengthening and balance   PT Home Exercise Plan supine therex given 12/02/16   Consulted and Agree with Plan of Care Patient      Patient will benefit from skilled therapeutic intervention in order to improve the following deficits and impairments:  Abnormal gait, Decreased activity tolerance, Decreased balance, Difficulty walking, Decreased strength, Impaired flexibility, Pain, Decreased mobility, Decreased endurance, Decreased safety awareness  Visit Diagnosis: Difficulty in walking, not elsewhere classified  Muscle weakness (generalized)     Problem List Patient Active Problem List   Diagnosis Date Noted  . Bradycardia 05/17/2016  . Benign hypertensive heart disease without heart failure 12/23/2012  . Dyspnea on exertion 08/25/2012  . Pedal edema 08/25/2012  . Heart murmur 08/25/2012  . Right inguinal hernia 02/28/2011     Encarnacion ChuAshley Star Resler PT, DPT 12/18/2016, 2:31 PM  [facility] [address] [place], [place], [address] Phone: [phone]   Fax:  [phone]  Name: Perry [name] MRN: [id number] Date of Birth: 07/28/1929

## 2016-12-23 ENCOUNTER — Ambulatory Visit: Payer: Medicare Other | Admitting: Physical Therapy

## 2016-12-23 ENCOUNTER — Encounter: Payer: Self-pay | Admitting: Physical Therapy

## 2016-12-23 DIAGNOSIS — R262 Difficulty in walking, not elsewhere classified: Secondary | ICD-10-CM

## 2016-12-23 DIAGNOSIS — M6281 Muscle weakness (generalized): Secondary | ICD-10-CM

## 2016-12-23 NOTE — Therapy (Addendum)
[place] [facility] MAIN [facility] SERVICES [address] [place], [place], [address] Phone: [phone]   Fax:  [phone]  Physical Therapy Treatment  Patient Details  Name: Perry [name] MRN: [id number] Date of Birth: 03-19-1929 Referring Provider: Curtis Sites III  Encounter Date: 12/23/2016      PT End of Session - 12/23/16 1425    Visit Number 6   Number of Visits 17   Date for PT Re-Evaluation Jan 30, 2016   Authorization Type g codes 6/10   PT Start Time 0220   PT Stop Time 0300   PT Time Calculation (min) 40 min   Equipment Utilized During Treatment Gait belt   Activity Tolerance Patient limited by fatigue;Patient limited by pain   Behavior During Therapy [facility] for tasks assessed/performed      Past Medical History:  Diagnosis Date  . Arthritis   . Heart attack (HCC)   . Hyperlipidemia   . Hypertension   . Parkinson's disease (HCC)    lower extremity   . S/P TURP   . Urinary incontinence     Past Surgical History:  Procedure Laterality Date  . CHOLECYSTECTOMY    . HERNIA REPAIR    . HIP FUSION     lt  . KNEE SURGERY     rt knee  . PROSTATE SURGERY    . TONSILLECTOMY    . WRIST SURGERY      There were no vitals filed for this visit.      Subjective Assessment - 12/23/16 1424    Subjective Pt reports he is doing well this date. He has been doing his HEP without questions or concerns.  He denies falls. He is moving safer and with better posture during standing. He is able to walk indoors without his cane but uses furniture to balance himself.    Patient is accompained by: Family member   Pertinent History  CAD, depression wiht anxiety, diverticulosis, hyperlipidemia, HTN, Hyperthyroidism, leg edama, OA, palpatiions, BLE hip replacement, R TKR, He fell the end of december 2017, he went to the hospital and found out that he had a bladder infection.    Limitations Walking;Standing   How long can you sit comfortably? unlimited   How long can you stand comfortably? 10 mins   How long can you walk comfortably? 10 mins   Patient Stated Goals to be able to get up off the floor   Currently in Pain? No/denies   Pain Score 0-No pain   Pain Onset More than a month ago   Multiple Pain Sites No         Therapeutic Exercise:  Side stepping in parallel bars x 10 feet x 10 repetitions with cues to keep his hips parallel to parallel bars and not allow them to rotate  Stepping over bolster left and right and fwd/bwd x 10 with cGA and constant cueing to continue exercise Resisted side-steeping RTB 4 lengths x 2;; cues to not rotate trunk towards direction of mobility Star stepping left and right with cues to maintain correct erect posture, needs single UE assist on rW for support Step ups to 6 inch stool x 20 , cues for head position Eccentric step downs x 20 from 3 inch stool with cues to tap heel only Fwd step without UE support, side step and no UE support x 15 left and ight Gait training without AD and CGA for 300 feet, cues for posture and speed.   Min A throughout due  to patient being unsteady and losing balance multiple times throughout each direction; increased time to complete task                         PT Education - 12/23/16 1424    Education provided Yes   Education Details HEP technique and posture   Person(s) Educated Patient   Methods Explanation   Comprehension Verbalized understanding             PT Long Term Goals - 12/02/16 1659      PT LONG TERM GOAL #1   Title Patient will be independent in home exercise program to improve strength/mobility for better functional independence with ADLs   Time 8   Period Weeks   Status New     PT LONG TERM GOAL #2   Title Patient will increase six minute walk test distance to >1000 for progression to community ambulator and improve gait ability   Baseline 325 ft   Time 8   Period Weeks   Status New     PT LONG TERM GOAL #3    Title Patient will increase 10 meter walk test to >1.32m/s as to improve gait speed for better community ambulation and to reduce fall risk   Baseline .47 sec   Time 8   Period Weeks   Status New     PT LONG TERM GOAL #4   Title Patient will reduce timed up and go to <11 seconds to reduce fall risk and demonstrate improved transfer/gait ability.   Baseline 25.29 sec   Time 8   Period Weeks   Status New     PT LONG TERM GOAL #5   Title Patient (> 75 years old) will complete five times sit to stand test in < 15 seconds indicating an increased LE strength and improved balance.   Baseline 30.78 sec   Time 8   Period Weeks   Status New               Plan - 12/23/16 1426    Clinical Impression Statement Patient required min verbal cueing during TM walking for longer steps and reciprocal stepping, and required CGA during all dynamic standing balance activities.Patient continues to have decreased gait speed and wide base of support with flexed trunk and flexed knee posture.  Patient required occasional rest breaks between exercises due to fatigue. Patient tolerated exercise well . Patient will continue to benefit from skilled therapy in order to improve  dynamic standing balance activities and increase gait speed to reduce risk for falls   Clinical Impairments Affecting Rehab Potential This patient presents with 2 personal factors/ comorbidities including current situation and hx of a fall., and 3   body elements including body structures and functions, activity limitations and or participation restrictions : decreased strength abiity to transfer and ambulates with slow gait speed and, decreased mobility.  Patient's condition is evolving. Marland Kitchen    PT Frequency 2x / week   PT Duration 8 weeks   PT Treatment/Interventions Therapeutic exercise;Balance training;Therapeutic activities;Gait training;Stair training;Neuromuscular re-education;Functional mobility training   PT Next Visit Plan  strengthening and balance   PT Home Exercise Plan supine therex given 12/02/16   Consulted and Agree with Plan of Care Patient      Patient will benefit from skilled therapeutic intervention in order to improve the following deficits and impairments:  Abnormal gait, Decreased activity tolerance, Decreased balance, Difficulty walking, Decreased strength, Impaired flexibility, Pain, Decreased  mobility, Decreased endurance, Decreased safety awareness  Visit Diagnosis: Difficulty in walking, not elsewhere classified  Muscle weakness (generalized)     Problem List Patient Active Problem List   Diagnosis Date Noted  . Bradycardia 05/17/2016  . Benign hypertensive heart disease without heart failure 12/23/2012  . Dyspnea on exertion 08/25/2012  . Pedal edema 08/25/2012  . Heart murmur 08/25/2012  . Right inguinal hernia 02/28/2011   Ezekiel InaKristine S Thomasena Vandenheuvel, PT, DPT [place], Barkley BrunsKristine S 12/23/2016, 5:21 PM  [facility] [address] [place], [place], [address] Phone: [phone]   Fax:  [phone]  Name: Perry [name] MRN: [id number] Date of Birth: 08/09/1929

## 2016-12-25 ENCOUNTER — Ambulatory Visit: Payer: Medicare Other | Admitting: Physical Therapy

## 2016-12-25 ENCOUNTER — Encounter: Payer: Self-pay | Admitting: Physical Therapy

## 2016-12-25 DIAGNOSIS — R262 Difficulty in walking, not elsewhere classified: Secondary | ICD-10-CM | POA: Diagnosis not present

## 2016-12-25 DIAGNOSIS — M6281 Muscle weakness (generalized): Secondary | ICD-10-CM

## 2016-12-25 NOTE — Therapy (Signed)
[place] [facility] [address] [place], [place], [address] Phone: [phone]   Fax:  [phone]  Physical Therapy Treatment  Patient Details  Name: Perry [name] MRN: [id number] Date of Birth: 03/03/1929 Referring Provider: Curtis SitesKLEIN, BERT J III  Encounter Date: 12/25/2016      PT End of Session - 12/25/16 1404    Visit Number 7   Number of Visits 17   Date for PT Re-Evaluation 01/28/16   Authorization Type g codes 7/10   PT Start Time 0200   PT Stop Time 0245   PT Time Calculation (min) 45 min   Equipment Utilized During Treatment Gait belt   Activity Tolerance Patient limited by fatigue;Patient limited by pain   Behavior During Therapy WFL for tasks assessed/performed      Past Medical History:  Diagnosis Date  . Arthritis   . Heart attack (HCC)   . Hyperlipidemia   . Hypertension   . Parkinson's disease (HCC)    lower extremity   . S/P TURP   . Urinary incontinence     Past Surgical History:  Procedure Laterality Date  . CHOLECYSTECTOMY    . HERNIA REPAIR    . HIP FUSION     lt  . KNEE SURGERY     rt knee  . PROSTATE SURGERY    . TONSILLECTOMY    . WRIST SURGERY      There were no vitals filed for this visit.      Subjective Assessment - 12/25/16 1455    Subjective Pt reports he is doing well this date. He has been doing his HEP without questions or concerns.  He denies falls. He is moving safer and with better posture during standing.    Patient is accompained by: Family member   Pertinent History  CAD, depression wiht anxiety, diverticulosis, hyperlipidemia, HTN, Hyperthyroidism, leg edama, OA, palpatiions, BLE hip replacement, R TKR, He fell the end of december 2017, he went to the hospital and found out that he had a bladder infection.    Limitations Walking;Standing   How long can you sit comfortably? unlimited   How long can you stand comfortably? 10 mins   How long can you walk comfortably? 10  mins   Patient Stated Goals to be able to get up off the floor   Currently in Pain? No/denies   Pain Score 0-No pain   Pain Onset More than a month ago   Multiple Pain Sites No      Therapeutic Exercise and Neuromuscular Re-Ed:   Step ups to 6 inch stool with UE support fwd and side stepping left and right x 20 each direction in parallel bars, cues for posture  Side stepping and backwards walking in parallel bars with 1 UE support and advancing to no UE support;  Cues for upright posture and forward gaze. Pt demonstrates unsteadiness occasionally when ambulating backward.  Heel raises with BUE support on // bar. 2x10  Bil hip abd 2x10 in standing with UE support Bl hip extension 2x10 in standing with UE support Leg press 100 lbs x 20 x 3 Rhomberg stance on airex with ball toss x10 to L and x10 to R  2 disc side by side static balancing x1 minute with tendency to lose balance posteriorly with intermittent UE support x 5 repetitions  Alternating toe taps up to 8" step from airex with cues for soft toe tapping for greater control. Greater instability during L SLS. Intermittent  UE support,flexed posture and flexed knees and hips during all exercises with cues for posture and technique and CGA                            PT Education - 12/25/16 1400    Education provided Yes   Education Details HEP and posture   Person(s) Educated Patient   Methods Explanation   Comprehension Verbalized understanding;Returned demonstration;Verbal cues required             PT Long Term Goals - 12/02/16 1659      PT LONG TERM GOAL #1   Title Patient will be independent in home exercise program to improve strength/mobility for better functional independence with ADLs   Time 8   Period Weeks   Status New     PT LONG TERM GOAL #2   Title Patient will increase six minute walk test distance to >1000 for progression to community ambulator and improve gait ability   Baseline 325  ft   Time 8   Period Weeks   Status New     PT LONG TERM GOAL #3   Title Patient will increase 10 meter walk test to >1.39m/s as to improve gait speed for better community ambulation and to reduce fall risk   Baseline .47 sec   Time 8   Period Weeks   Status New     PT LONG TERM GOAL #4   Title Patient will reduce timed up and go to <11 seconds to reduce fall risk and demonstrate improved transfer/gait ability.   Baseline 25.29 sec   Time 8   Period Weeks   Status New     PT LONG TERM GOAL #5   Title Patient (> 79 years old) will complete five times sit to stand test in < 15 seconds indicating an increased LE strength and improved balance.   Baseline 30.78 sec   Time 8   Period Weeks   Status New               Plan - 12/25/16 1406    Clinical Impression Statement Pt is making improvements in BLE strength and balance as evidenced by improvements in his ability to perform treatment with less fatigue and no rest periods.  He demonstrates instability with posterior ambulation and when balancing on foam surface.  He will benefit from continued skilled PT interventions for improved strength, balance, and quality of life.    Clinical Impairments Affecting Rehab Potential This patient presents with 2 personal factors/ comorbidities including current situation and hx of a fall., and 3   body elements including body structures and functions, activity limitations and or participation restrictions : decreased strength abiity to transfer and ambulates with slow gait speed and, decreased mobility.  Patient's condition is evolving. Marland Kitchen    PT Frequency 2x / week   PT Duration 8 weeks   PT Treatment/Interventions Therapeutic exercise;Balance training;Therapeutic activities;Gait training;Stair training;Neuromuscular re-education;Functional mobility training   PT Next Visit Plan strengthening and balance   PT Home Exercise Plan supine therex given 12/02/16   Consulted and Agree with Plan of Care  Patient      Patient will benefit from skilled therapeutic intervention in order to improve the following deficits and impairments:  Abnormal gait, Decreased activity tolerance, Decreased balance, Difficulty walking, Decreased strength, Impaired flexibility, Pain, Decreased mobility, Decreased endurance, Decreased safety awareness  Visit Diagnosis: Difficulty in walking, not elsewhere classified  Muscle weakness (generalized)  Problem List Patient Active Problem List   Diagnosis Date Noted  . Bradycardia 05/17/2016  . Benign hypertensive heart disease without heart failure 12/23/2012  . Dyspnea on exertion 08/25/2012  . Pedal edema 08/25/2012  . Heart murmur 08/25/2012  . Right inguinal hernia 02/28/2011  [name], PT, DPT  [place], [place] S 12/25/2016, 2:57 PM  [place] [facility] MAIN [facility] SERVICES [address] [place], [place], [address] Phone: [phone]   Fax:  [phone]  Name: Perry [name] MRN: [id number] Date of Birth: 1928/11/18

## 2016-12-28 NOTE — Progress Notes (Signed)
Cardiology Office Note  Date:  12/31/2016   ID:  Perry [name], DOB [birth year], MRN [id number]  PCP:  Lynnea Ferrier, MD   Chief Complaint  Patient presents with  . other    6 month follow up. Patient denies chest pain and SOB. Meds reviewed verbally with patient.     HPI:  Perry [name] is a 83 y.o. with a hx of  HTN shortness of breath on exertion chronic peripheral edema and stasis dermatitis related to venous insufficiency. Falls, ataxia, imbalance/dizziness , parkinsons/dementia Depression/anxiety Who presents for follow up of his leg swelling and HTN  In follow-up today he reports that he has leg weakness from the Parkinson's Exercising on bike, Had a fall getting off the bike, has not been back to exercise yet Legs weak  Trouble with urination, frequency overnight Tired, weakness Amlodipine held on his previous office visit and leg edema has improved Heart rate has been running low at rehabilitation  EKG personally reviewed by myself on todays visit Shows sinus bradycardia rate 49 bpm no significant ST or T-wave changes  Other past medical history reviewed No prior cardiac history, no coronary disease noted, no structural heart disease  >5 years ago  worked up at [facility] and had an MRI of his heart looking for a possible scar but he does not recall the results.   echocardiogram on 09/03/12 which showed an ejection fraction of 55-65% and grade 1 diastolic dysfunction. There was mild aortic insufficiency and mild mitral regurgitation.  Lab work reviewed with him in detail HCT 35, CR 0.9 toal chol 190, LDL 124 (down from 151 in 08/270) TSH 0.115  EKG on today's visit shows normal sinus rhythm with rate 54 bpm, right bundle branch block  Other past medical history overactive bladder for which he sees Dr. Cassell Smiles.    history of skin cancer for which he is followed at [facility].   chronic peripheral edema and stasis dermatitis related to venous insufficiency.   He  does not have any history of DVT.  He had venous Dopplers of his legs in 2012 which were negative for DVT.   PMH:   has a past medical history of Arthritis; Heart attack (HCC); Hyperlipidemia; Hypertension; Parkinson's disease (HCC); S/P TURP; and Urinary incontinence.  PSH:    Past Surgical History:  Procedure Laterality Date  . CHOLECYSTECTOMY    . HERNIA REPAIR    . HIP FUSION     lt  . KNEE SURGERY     rt knee  . PROSTATE SURGERY    . TONSILLECTOMY    . WRIST SURGERY      Current Outpatient Prescriptions  Medication Sig Dispense Refill  . B Complex Vitamins (B-COMPLEX/B-12 PO) Take 1 capsule by mouth daily.     . benazepril (LOTENSIN) 20 MG tablet Take 20 mg by mouth 2 (two) times daily.      . calcium-vitamin D (OSCAL WITH D) 500-200 MG-UNIT per tablet Take 1 tablet by mouth daily.      . carvedilol (COREG) 12.5 MG tablet Take 12.5 mg by mouth 2 (two) times daily with a meal.    . cephALEXin (KEFLEX) 500 MG capsule Take 1 capsule (500 mg total) by mouth 3 (three) times daily. 30 capsule 0  . Cholecalciferol (VITAMIN D) 2000 UNITS CAPS Take 2,000 Units by mouth daily.     . Coenzyme Q10 (CO Q-10) 200 MG CAPS Take 200 mg by mouth daily.    . cyanocobalamin (V-R VITAMIN  B-12) 500 MCG tablet Take by mouth.    . donepezil (ARICEPT) 10 MG tablet Take 10 mg by mouth at bedtime.    . fish oil-omega-3 fatty acids 1000 MG capsule Take 2 g by mouth daily.      Marland Kitchen HYDROcodone-acetaminophen (NORCO/VICODIN) 5-325 MG tablet Take 1 tablet by mouth every 4 (four) hours as needed for moderate pain. 20 tablet 0  . hydrocortisone 2.5 % cream Apply 1 application topically daily as needed. (affected areas)    . magnesium 30 MG tablet Take 30 mg by mouth 2 (two) times daily.      . mirabegron ER (MYRBETRIQ) 50 MG TB24 Take 100 mg by mouth daily.     . potassium chloride SA (K-DUR,KLOR-CON) 20 MEQ tablet Take 20 mEq by mouth 2 (two) times daily.    . tadalafil (CIALIS) 20 MG tablet Take 20 mg by  mouth daily as needed. (erectile dysfunction)    . timolol (TIMOPTIC) 0.5 % ophthalmic solution Place 1 drop into the left eye 2 (two) times daily.     . carbidopa-levodopa (SINEMET IR) 25-100 MG tablet Take by mouth 3 (three) times daily.     . sertraline (ZOLOFT) 50 MG tablet Take 50 mg by mouth at bedtime.      No current facility-administered medications for this visit.      Allergies:   Tetanus toxoids; Atorvastatin; and Rosuvastatin   Social History:  The patient  reports that he has quit smoking. His smoking use included Cigars. He has never used smokeless tobacco. He reports that he does not drink alcohol.   Family History:   family history includes Appendicitis in his brother; Healthy in his mother; Heart disease in his sister; Hypertension in his father and sister; Ovarian cancer in his sister.    Review of Systems: Review of Systems  Respiratory: Negative.   Cardiovascular: Negative.   Gastrointestinal: Negative.   Musculoskeletal: Negative.        Gait instability  Neurological: Positive for weakness.  Psychiatric/Behavioral: Negative.   All other systems reviewed and are negative.    PHYSICAL EXAM: VS:  BP (!) 156/66 (BP Location: Right Arm, Patient Position: Sitting, Cuff Size: Normal)   Pulse (!) 49   Ht 5\' 9"  (1.753 m)   Wt 169 lb 4 oz (76.8 kg)   BMI 24.99 kg/m  , BMI Body mass index is 24.99 kg/m. GEN: Well nourished, well developed, in no acute distress  HEENT: normal  Neck: no JVD, carotid bruits, or masses Cardiac: Regular rate, bradycardic, 1/^ SEM RSB,  rubs, or gallops, no significant leg swelling/edema Respiratory:  clear to auscultation bilaterally, normal work of breathing GI: soft, nontender, nondistended, + BS MS: no deformity or atrophy  Skin: warm and dry, no rash Neuro:  Strength and sensation are intact Psych: euthymic mood, full affect    Recent Labs: 08/07/2016: ALT 6; BUN 26; Creatinine, Ser 1.21; Hemoglobin 12.8; Platelets 62;  Potassium 3.6; Sodium 138    Lipid Panel No results found for: CHOL, HDL, LDLCALC, TRIG    Wt Readings from Last 3 Encounters:  12/31/16 169 lb 4 oz (76.8 kg)  08/07/16 180 lb (81.6 kg)  05/17/16 176 lb 8 oz (80.1 kg)       ASSESSMENT AND PLAN:  Benign hypertensive heart disease without heart failure - Plan: EKG 12-Lead Edema better by holding amlodipine Recommended that he hold his carvedilol given his bradycardia Suggested he add Cardura 4 mg twice a day He has nocturia If  blood pressure continues to run high we would increase Cardura up to 8 mg twice a day Recommended he call our office with blood pressure numbers from home  Heart murmur - Plan: EKG 12-Lead Likely has aortic valve sclerosis, no significant stenosis  Leg swelling Venous stasis. Much improved by holding amlodipine  Bradycardia Discussed his low heart rate with him in detail Recommended he stop the carvedilol This may help his energy level   Total encounter time more than 25 minutes  Greater than 50% was spent in counseling and coordination of care with the patient   Disposition:   F/U  6 months   Orders Placed This Encounter  Procedures  . EKG 12-Lead     Signed, Dossie Arbourim Gollan, M.D., Ph.D. 12/31/2016  [facility], [place] [phone]

## 2016-12-30 ENCOUNTER — Ambulatory Visit: Payer: Medicare Other

## 2016-12-30 VITALS — BP 152/78 | HR 52

## 2016-12-30 DIAGNOSIS — M6281 Muscle weakness (generalized): Secondary | ICD-10-CM

## 2016-12-30 DIAGNOSIS — R262 Difficulty in walking, not elsewhere classified: Secondary | ICD-10-CM | POA: Diagnosis not present

## 2016-12-30 NOTE — Therapy (Signed)
[facility] MAIN [facility] SERVICES [address] [place], [place], [address] Phone: [phone]   Fax:  [phone]  Physical Therapy Treatment  Patient Details  Name: Perry [name] MRN: [id number] Date of Birth: 05/21/1929 Referring Provider: Curtis Sites III  Encounter Date: 12/30/2016      PT End of Session - 12/30/16 1309    Visit Number 8   Number of Visits 17   Date for PT Re-Evaluation 2016/02/20   Authorization Type g codes 8/10   PT Start Time 1306   PT Stop Time 1345   PT Time Calculation (min) 39 min   Equipment Utilized During Treatment Gait belt   Activity Tolerance Patient limited by fatigue;Patient limited by pain   Behavior During Therapy [facility] for tasks assessed/performed      Past Medical History:  Diagnosis Date  . Arthritis   . Heart attack (HCC)   . Hyperlipidemia   . Hypertension   . Parkinson's disease (HCC)    lower extremity   . S/P TURP   . Urinary incontinence     Past Surgical History:  Procedure Laterality Date  . CHOLECYSTECTOMY    . HERNIA REPAIR    . HIP FUSION     lt  . KNEE SURGERY     rt knee  . PROSTATE SURGERY    . TONSILLECTOMY    . WRIST SURGERY      Vitals:   12/30/16 1314  BP: (!) 152/78  Pulse: (!) 52  SpO2: 98%        Subjective Assessment - 12/30/16 1306    Subjective Pt reports he is doing well this date. He has been doing his HEP without questions or concerns.  He denies falls since the last therapy session. No specific questions or concerns currently.   Patient is accompained by: Family member   Pertinent History  CAD, depression wiht anxiety, diverticulosis, hyperlipidemia, HTN, Hyperthyroidism, leg edama, OA, palpatiions, BLE hip replacement, R TKR, He fell the end of december 2017, he went to the hospital and found out that he had a bladder infection.    Limitations Walking;Standing   How long can you sit comfortably? unlimited   How long can you stand comfortably? 10  mins   How long can you walk comfortably? 10 mins   Patient Stated Goals to be able to get up off the floor   Currently in Pain? No/denies   Pain Onset --           TREATMENT  Therapeutic Exercise Quantum leg press 105# x 20, 120# 2 x 20  Sit to stand from regular height chair without UE support x 10; Step ups to 5 inch stool without UE support (intermittent touching by patient) forward alternating LE x 8 each, cues for posture?   Neuromuscular Re-Ed ?  Star taps for speed from 12 o'clock to 6 o'clock x multiple bouts/side; Toe taps to 6" step x 10 bilateral; Sit to stand without UE support sitting on Airex with Airex under feet as well x 10; Airex balance with feet apart, horizontal and vertical head turns x 30s each; Airex balance with feet apart, ball pass horizontally around body x 3 minutes;                 PT Education - 12/30/16 1309    Education provided Yes   Education Details HEP reinforced   Person(s) Educated Patient   Methods Explanation   Comprehension Verbalized understanding  PT Long Term Goals - 12/02/16 1659      PT LONG TERM GOAL #1   Title Patient will be independent in home exercise program to improve strength/mobility for better functional independence with ADLs   Time 8   Period Weeks   Status New     PT LONG TERM GOAL #2   Title Patient will increase six minute walk test distance to >1000 for progression to community ambulator and improve gait ability   Baseline 325 ft   Time 8   Period Weeks   Status New     PT LONG TERM GOAL #3   Title Patient will increase 10 meter walk test to >1.5880m/s as to improve gait speed for better community ambulation and to reduce fall risk   Baseline .47 sec   Time 8   Period Weeks   Status New     PT LONG TERM GOAL #4   Title Patient will reduce timed up and go to <11 seconds to reduce fall risk and demonstrate improved transfer/gait ability.   Baseline 25.29 sec   Time 8    Period Weeks   Status New     PT LONG TERM GOAL #5   Title Patient (> 146 years old) will complete five times sit to stand test in < 15 seconds indicating an increased LE strength and improved balance.   Baseline 30.78 sec   Time 8   Period Weeks   Status New               Plan - 12/30/16 1309    Clinical Impression Statement Pt continues to make improvement in bilateral LE strength and is able to increase his resistance with leg press today. He moves slowly and does fatigue relatively quickly. Pt encouraged to continue HEP and follow-up as scheduled.    Clinical Impairments Affecting Rehab Potential This patient presents with 2 personal factors/ comorbidities including current situation and hx of a fall., and 3   body elements including body structures and functions, activity limitations and or participation restrictions : decreased strength abiity to transfer and ambulates with slow gait speed and, decreased mobility.  Patient's condition is evolving. Marland Kitchen.    PT Frequency 2x / week   PT Duration 8 weeks   PT Treatment/Interventions Therapeutic exercise;Balance training;Therapeutic activities;Gait training;Stair training;Neuromuscular re-education;Functional mobility training   PT Next Visit Plan strengthening and balance   PT Home Exercise Plan supine therex given 12/02/16   Consulted and Agree with Plan of Care Patient      Patient will benefit from skilled therapeutic intervention in order to improve the following deficits and impairments:  Abnormal gait, Decreased activity tolerance, Decreased balance, Difficulty walking, Decreased strength, Impaired flexibility, Pain, Decreased mobility, Decreased endurance, Decreased safety awareness  Visit Diagnosis: Difficulty in walking, not elsewhere classified  Muscle weakness (generalized)     Problem List Patient Active Problem List   Diagnosis Date Noted  . Bradycardia 05/17/2016  . Benign hypertensive heart disease without  heart failure 12/23/2012  . Dyspnea on exertion 08/25/2012  . Pedal edema 08/25/2012  . Heart murmur 08/25/2012  . Right inguinal hernia 02/28/2011   Lynnea MaizesJason D Marybell Robards PT, DPT   Ilhan Madan 12/31/2016, 5:25 PM  [facility] MAIN [facility] [address] [place], [place], [address] Phone: [phone]   Fax:  [phone]  Name: Pierre BaliLacy R Greenspan MRN: [id number] Date of Birth: 02/27/1929

## 2016-12-31 ENCOUNTER — Ambulatory Visit (INDEPENDENT_AMBULATORY_CARE_PROVIDER_SITE_OTHER): Payer: Medicare Other | Admitting: Cardiovascular Disease

## 2016-12-31 ENCOUNTER — Encounter: Payer: Self-pay | Admitting: Cardiovascular Disease

## 2016-12-31 VITALS — BP 156/66 | HR 49 | Ht 69.0 in | Wt 169.2 lb

## 2016-12-31 DIAGNOSIS — G20 Parkinson's disease: Secondary | ICD-10-CM | POA: Diagnosis not present

## 2016-12-31 DIAGNOSIS — F0280 Dementia in other diseases classified elsewhere without behavioral disturbance: Secondary | ICD-10-CM | POA: Diagnosis not present

## 2016-12-31 DIAGNOSIS — I119 Hypertensive heart disease without heart failure: Secondary | ICD-10-CM

## 2016-12-31 DIAGNOSIS — R0609 Other forms of dyspnea: Secondary | ICD-10-CM

## 2016-12-31 DIAGNOSIS — R600 Localized edema: Secondary | ICD-10-CM

## 2016-12-31 MED ORDER — DOXAZOSIN MESYLATE 8 MG PO TABS: 8.0000 mg | ORAL_TABLET | Freq: Two times a day (BID) | ORAL | 6 refills | Status: DC

## 2016-12-31 MED ORDER — DOXAZOSIN MESYLATE 8 MG PO TABS: 8.0000 mg | ORAL_TABLET | Freq: Two times a day (BID) | ORAL | 6 refills | Status: AC

## 2016-12-31 NOTE — Patient Instructions (Addendum)
Medication Instructions:   Stop the coreg/carvedilol  Start cardura/doxazosin 1/2 pill twice a day Monitor blood pressure  If blood pressure runs high (>150), Then increase up to a full pill twice a day  Labwork:  No new labs needed  Testing/Procedures:  No further testing at this time   I recommend watching educational videos on topics of interest to you at:       www.goemmi.com  Enter code: HEARTCARE    Follow-Up: It was a pleasure seeing you in the office today. Please call us if you have new issues that need to be addressed before your next appt.  [phone]  Your physician wants you to follow-up in: 6 months.  You will receive a reminder letter in the mail two months in advance. If you don't receive a letter, please call our office to schedule the follow-up appointment.  If you need a refill on your cardiac medications before your next appointment, please call your pharmacy.

## 2017-01-01 ENCOUNTER — Ambulatory Visit: Payer: Medicare Other

## 2017-01-01 VITALS — BP 129/68 | HR 55

## 2017-01-01 DIAGNOSIS — R262 Difficulty in walking, not elsewhere classified: Secondary | ICD-10-CM

## 2017-01-01 DIAGNOSIS — M6281 Muscle weakness (generalized): Secondary | ICD-10-CM

## 2017-01-01 NOTE — Therapy (Signed)
[facility] MAIN [facility] SERVICES [address] [place], [place], [address] Phone: [phone]   Fax:  [phone]  Physical Therapy Treatment  Patient Details  Name: Perry [name] MRN: [id number] Date of Birth: [birth year] Referring Provider: Curtis Sites III  Encounter Date: 01/01/2017      PT End of Session - 01/01/17 1452    Visit Number 9   Number of Visits 17   Date for PT Re-Evaluation 2016/02/24   Authorization Type g codes May 19, 2023   PT Start Time 1347   PT Stop Time 1435   PT Time Calculation (min) 48 min   Equipment Utilized During Treatment Gait belt   Activity Tolerance Patient tolerated treatment well   Behavior During Therapy WFL for tasks assessed/performed      Past Medical History:  Diagnosis Date  . Arthritis   . Heart attack (HCC)   . Hyperlipidemia   . Hypertension   . Parkinson's disease (HCC)    lower extremity   . S/P TURP   . Urinary incontinence     Past Surgical History:  Procedure Laterality Date  . CHOLECYSTECTOMY    . HERNIA REPAIR    . HIP FUSION     lt  . KNEE SURGERY     rt knee  . PROSTATE SURGERY    . TONSILLECTOMY    . WRIST SURGERY      Vitals:   01/01/17 1353  BP: 129/68  Pulse: (!) 55  SpO2: 97%        Subjective Assessment - 01/01/17 1348    Subjective Pt reports he is doing well today. No reported soreness following last therapy session. He feels like he had a good workout. No specific questions or concerns. No falls since last appointment. He has seen his cardiologist who adjusted his BP meds due to low HR.   Patient is accompained by: Family member   Pertinent History  CAD, depression wiht anxiety, diverticulosis, hyperlipidemia, HTN, Hyperthyroidism, leg edama, OA, palpatiions, BLE hip replacement, R TKR, He fell the end of december 2017, he went to the hospital and found out that he had a bladder infection.    Limitations Walking;Standing   How long can you sit comfortably?  unlimited   How long can you stand comfortably? 10 mins   How long can you walk comfortably? 10 mins   Patient Stated Goals to be able to get up off the floor   Currently in Pain? No/denies          TREATMENT  Therapeutic Exercise Quantum leg press 120# x 20, 125# x 20, 135# x 20;  Rockerboard squats A/P and R/L direction x 10 each; Sit to stand from regular height chair without UE support and Airex under feet (no Airex on seat) 2 x 5, intermittent minA+1 but with form correction he is able to perform mostly independently, cues for fully upright posture once standing; Forward step ups to 6 inch stool without UE support (intermittent touching by patient) alternating LE x 8 each, cues for posture?   Neuromuscular Re-Ed ?  Rockerboard balance A/P and R/L direction x 1 minute each;  Rockerboard balance A/P and R/L direction with horizontal and vertical head turns; Lateral stepping over 1/2 foam bolster x 10 each direction with intermittent touching with UE on // bars; Toe taps to 6" step x 10 bilateral with intermittent tapping on // bars and CGA/minA+1 from therapist;  Intermittent seated rest breaks provided due to fatigue.  PT Education - 01/01/17 1451    Education provided Yes   Education Details exercise form/technique   Person(s) Educated Patient   Methods Explanation   Comprehension Verbalized understanding             PT Long Term Goals - 12/02/16 1659      PT LONG TERM GOAL #1   Title Patient will be independent in home exercise program to improve strength/mobility for better functional independence with ADLs   Time 8   Period Weeks   Status New     PT LONG TERM GOAL #2   Title Patient will increase six minute walk test distance to >1000 for progression to community ambulator and improve gait ability   Baseline 325 ft   Time 8   Period Weeks   Status New     PT LONG TERM GOAL #3   Title Patient will increase  10 meter walk test to >1.65m/s as to improve gait speed for better community ambulation and to reduce fall risk   Baseline .47 sec   Time 8   Period Weeks   Status New     PT LONG TERM GOAL #4   Title Patient will reduce timed up and go to <11 seconds to reduce fall risk and demonstrate improved transfer/gait ability.   Baseline 25.29 sec   Time 8   Period Weeks   Status New     PT LONG TERM GOAL #5   Title Patient (> 12 years old) will complete five times sit to stand test in < 15 seconds indicating an increased LE strength and improved balance.   Baseline 30.78 sec   Time 8   Period Weeks   Status New               Plan - 01/01/17 1452    Clinical Impression Statement Pt is again able to increase his resistance on the leg press today and even at the highest weight today demonstrates only minimal fatigue by the final repetition. He does require frequent cues today for correct form during exercises. Pt encouraged to continue HEP and follow-up as scheduled.    Clinical Impairments Affecting Rehab Potential This patient presents with 2 personal factors/ comorbidities including current situation and hx of a fall., and 3   body elements including body structures and functions, activity limitations and or participation restrictions : decreased strength abiity to transfer and ambulates with slow gait speed and, decreased mobility.  Patient's condition is evolving. Marland Kitchen    PT Frequency 2x / week   PT Duration 8 weeks   PT Treatment/Interventions Therapeutic exercise;Balance training;Therapeutic activities;Gait training;Stair training;Neuromuscular re-education;Functional mobility training   PT Next Visit Plan outcome measures and update g codes. Strengthening and balance   PT Home Exercise Plan supine therex given 12/02/16   Consulted and Agree with Plan of Care Patient      Patient will benefit from skilled therapeutic intervention in order to improve the following deficits and  impairments:  Abnormal gait, Decreased activity tolerance, Decreased balance, Difficulty walking, Decreased strength, Impaired flexibility, Pain, Decreased mobility, Decreased endurance, Decreased safety awareness  Visit Diagnosis: Difficulty in walking, not elsewhere classified  Muscle weakness (generalized)     Problem List Patient Active Problem List   Diagnosis Date Noted  . Bradycardia 05/17/2016  . Benign hypertensive heart disease without heart failure 12/23/2012  . Dyspnea on exertion 08/25/2012  . Pedal edema 08/25/2012  . Heart murmur 08/25/2012  . Right inguinal hernia 02/28/2011  Lynnea MaizesJason D Huprich PT, DPT   Huprich,Jason 01/01/2017, 2:57 PM  [facility] [address] [place], [place], [address] Phone: [phone]   Fax:  [phone]  Name: Perry [name] MRN: [id number] Date of Birth: 08/03/1929

## 2017-01-07 ENCOUNTER — Ambulatory Visit: Payer: Medicare Other | Admitting: Physical Therapy

## 2017-01-07 ENCOUNTER — Encounter: Payer: Self-pay | Admitting: Physical Therapy

## 2017-01-07 DIAGNOSIS — M6281 Muscle weakness (generalized): Secondary | ICD-10-CM

## 2017-01-07 DIAGNOSIS — R262 Difficulty in walking, not elsewhere classified: Secondary | ICD-10-CM

## 2017-01-07 NOTE — Therapy (Addendum)
[place] [facility] SERVICES [address] [place], [place], [address] Phone: [phone]   Fax:  [phone]  Physical Therapy Treatment/ Progress note  Patient Details  Name: Perry [name] MRN: [id number] Date of Birth: 01-05-1929 Referring Provider: Tama High III  Encounter Date: 01/07/2017      PT End of Session - 01/07/17 1407    Visit Number 10   Number of Visits 17   Date for PT Re-Evaluation 2016-02-22   Authorization Type g codes 10/10   PT Start Time 0200   PT Stop Time 0245   PT Time Calculation (min) 45 min   Equipment Utilized During Treatment Gait belt   Activity Tolerance Patient tolerated treatment well   Behavior During Therapy WFL for tasks assessed/performed      Past Medical History:  Diagnosis Date  . Arthritis   . Heart attack ([place])   . Hyperlipidemia   . Hypertension   . Parkinson's disease ([place])    lower extremity   . S/P TURP   . Urinary incontinence     Past Surgical History:  Procedure Laterality Date  . CHOLECYSTECTOMY    . HERNIA REPAIR    . HIP FUSION     lt  . KNEE SURGERY     rt knee  . PROSTATE SURGERY    . TONSILLECTOMY    . WRIST SURGERY      There were no vitals filed for this visit.      Subjective Assessment - 01/07/17 1405    Subjective Pt reports he is doing well today. No reported soreness following last therapy session. He feels that he is making some progress towards his walking.    Patient is accompained by: Family member   Pertinent History  CAD, depression wiht anxiety, diverticulosis, hyperlipidemia, HTN, Hyperthyroidism, leg edama, OA, palpatiions, BLE hip replacement, R TKR, He fell the end of december 2017, he went to the hospital and found out that he had a bladder infection.    Limitations Walking;Standing   How long can you sit comfortably? unlimited   How long can you stand comfortably? 10 mins   How long can you walk comfortably? 10 mins   Patient Stated  Goals to be able to get up off the floor   Currently in Pain? No/denies   Pain Score 0-No pain   Pain Onset More than a month ago   Multiple Pain Sites No       Outcome measures performed : 6 MW, TUG, 10 MW, 5 x sit to stand, and patient mad progress towards all with all goals ongoing .   Therapeutic exercises: Eccentric step downs from 6 inch stool x 5 reps x 4 sets BLE Leg press with 100 lbs x 20 x 3 sets, heel raises  20 x 3 sets Step ups x 20 x 2 sets to 6 inch stool with cues for posture correction Eccentric step downs x 10 BLE; cues to not put too much weight bearing on UE's Squats x 10 with 5 sec hold, cues to maintain erect position Heel raises x 10 x 2 ; cues to not rock forward Resisted side-steeping RTB 4 lengths x 2;; cues to not rotate trunk towards direction of mobility Standing mini squats 2 x 10 with RTB around knees to encourage abduction; cues to maintain correct erect posture  PT Education - 2017/01/29 1406    Education provided Yes   Education Details safety with exercises   Person(s) Educated Patient   Methods Explanation   Comprehension Verbalized understanding;Returned demonstration;Verbal cues required             PT Long Term Goals - 01-29-2017 1409      PT LONG TERM GOAL #1   Title Patient will be independent in home exercise program to improve strength/mobility for better functional independence with ADLs   Time 8   Period Weeks   Status New     PT LONG TERM GOAL #2   Title Patient will increase six minute walk test distance to >1000 for progression to community ambulator and improve gait ability   Baseline 410 ft   Time 8   Period Weeks   Status On-going     PT LONG TERM GOAL #3   Title Patient will increase 10 meter walk test to >1.11ms as to improve gait speed for better community ambulation and to reduce fall risk   Baseline .52 m/sec   Time 8   Period Weeks   Status On-going     PT  LONG TERM GOAL #4   Title Patient will reduce timed up and go to <11 seconds to reduce fall risk and demonstrate improved transfer/gait ability.   Baseline 27.73   Time 8   Period Weeks   Status On-going     PT LONG TERM GOAL #5   Baseline 21.67   Time 8   Period Weeks   Status On-going               Plan - [id number]-[id number]    Clinical Impression Statement Pt is making improvements in BLE strength and balance as evidenced by improvements in his 5xSTS and TUG times.  He has met his goal with the 130m and almost double his gait speed.  Provided pt with HEP handout and encouraged pt to complete these exercises at least 5 days/wk at a convenient time of day for him.  He demonstrates instability with posterior ambulation and when balancing on ant/post rockerboard.  He will benefit from continued skilled PT interventions for improved strength, balance, and QOL   Rehab Potential Good   Clinical Impairments Affecting Rehab Potential This patient presents with 2 personal factors/ comorbidities including current situation and hx of a fall., and 3   body elements including body structures and functions, activity limitations and or participation restrictions : decreased strength abiity to transfer and ambulates with slow gait speed and, decreased mobility.  Patient's condition is evolving. . Marland Kitchen  PT Frequency 2x / week   PT Duration 8 weeks   PT Treatment/Interventions Therapeutic exercise;Balance training;Therapeutic activities;Gait training;Stair training;Neuromuscular re-education;Functional mobility training   PT Next Visit Plan outcome measures and update g codes. Strengthening and balance   PT Home Exercise Plan supine therex given 12/02/16   Consulted and Agree with Plan of Care Patient      Patient will benefit from skilled therapeutic intervention in order to improve the following deficits and impairments:  Abnormal gait, Decreased activity tolerance, Decreased balance, Difficulty walking,  Decreased strength, Impaired flexibility, Pain, Decreased mobility, Decreased endurance, Decreased safety awareness  Visit Diagnosis: Difficulty in walking, not elsewhere classified  Muscle weakness (generalized)       G-Codes - 052018/06/20428    Functional Assessment Tool Used (Outpatient Only) TUG, 10 MW, 6 MW, 5 x sit to  stand   Functional Limitation Mobility:  Walking and moving around   Mobility: Walking and Moving Around Current Status [phone]) At least 60 percent but less than 80 percent impaired, limited or restricted   Mobility: Walking and Moving Around Goal Status [phone]) At least 40 percent but less than 60 percent impaired, limited or restricted      Problem List Patient Active Problem List   Diagnosis Date Noted  . Bradycardia 05/17/2016  . Benign hypertensive heart disease without heart failure 12/23/2012  . Dyspnea on exertion 08/25/2012  . Pedal edema 08/25/2012  . Heart murmur 08/25/2012  . Right inguinal hernia 02/28/2011   Alanson Puls, PT, DPT [place], [place] S 01/07/2017, 2:44 PM  [place] [facility] SERVICES [address] [place], [place], [address] Phone: [phone]   Fax:  [phone]  Name: Perry [name] MRN: [id number] Date of Birth: March 06, 1929

## 2017-01-09 ENCOUNTER — Encounter: Payer: Self-pay | Admitting: Physical Therapy

## 2017-01-09 ENCOUNTER — Ambulatory Visit: Payer: Medicare Other | Admitting: Physical Therapy

## 2017-01-09 DIAGNOSIS — R262 Difficulty in walking, not elsewhere classified: Secondary | ICD-10-CM

## 2017-01-09 DIAGNOSIS — M6281 Muscle weakness (generalized): Secondary | ICD-10-CM

## 2017-01-09 NOTE — Therapy (Signed)
[facility] [address] [place], [place], [address] Phone: [phone]   Fax:  [phone]  Physical Therapy Treatment  Patient Details  Name: Perry BaliLacy R [name] MRN: [id number] Date of Birth: 10/06/1928 Referring Provider: Curtis SitesKLEIN, BERT J III  Encounter Date: 01/09/2017      PT End of Session - 01/09/17 1404    Visit Number 11   Number of Visits 17   Date for PT Re-Evaluation 01/28/16   Authorization Type g codes 1/10   PT Start Time 0200   PT Stop Time 0240   PT Time Calculation (min) 40 min   Equipment Utilized During Treatment Gait belt   Activity Tolerance Patient tolerated treatment well   Behavior During Therapy WFL for tasks assessed/performed      Past Medical History:  Diagnosis Date  . Arthritis   . Heart attack ([facility])   . Hyperlipidemia   . Hypertension   . Parkinson's disease ([facility])    lower extremity   . S/P TURP   . Urinary incontinence     Past Surgical History:  Procedure Laterality Date  . CHOLECYSTECTOMY    . HERNIA REPAIR    . HIP FUSION     lt  . KNEE SURGERY     rt knee  . PROSTATE SURGERY    . TONSILLECTOMY    . WRIST SURGERY      There were no vitals filed for this visit.      Subjective Assessment - 01/09/17 1403    Subjective Pt reports he is doing well today. No reported soreness following last therapy session. He feels that he is making some progress towards his walking.    Patient is accompained by: Family member   Pertinent History  CAD, depression wiht anxiety, diverticulosis, hyperlipidemia, HTN, Hyperthyroidism, leg edama, OA, palpatiions, BLE hip replacement, R TKR, He fell the end of december 2017, he went to the hospital and found out that he had a bladder infection.    Limitations Walking;Standing   How long can you sit comfortably? unlimited   How long can you stand comfortably? 10 mins   How long can you walk comfortably? 10 mins   Patient Stated Goals to be able  to get up off the floor   Currently in Pain? No/denies   Pain Score 0-No pain   Pain Onset More than a month ago       TREATMENT:   Treadmill 1 . 0 mph with  HHA  x 1 min x 5 sessions  with cues to improve erect posture and increase step length for better gait safety;  Required close supervision for safety; able to exhibit good foot placement without veering on treadmill with UE support   Therapeutic exercise:  Eccentric step downs x 10 BLE; cues to not put too much weight bearing on UE's  Squats x 10 with 5 sec hold, cues to maintain erect position  Heel raises x 10 x 2 ; cues to not rock forward  Resisted side-steeping RTB 4 lengths x 2;; cues to not rotate trunk towards direction of mobility  Standing mini squats 2 x 10 with RTB around knees to encourage abduction; cues to maintain correct erect posture  Sit to stand without UE support 2 x 10; cues for technique and posture correction  Step-ups to 6" step x 10 bilateral; cues for getting entire foot on step  Quantum leg press 120 # x 20 x 3; cues for not snapping  LE in extension and performing slowly  Min cuing needed to maintain posture while performing strengthening tasks. Inconsistent form and posture was demonstrated throughout exercises.  Patientt tolerated activity and reported less joint stiffness after exercises. Pt tolerates treatment well with no  pain. Pt. demonstrated good control and understanding with standing hip 3-way. Minimal cueing needed to point toe forward and keep knee straight with task. CGA needed throughout.                          PT Education - 01/09/17 1403    Education provided Yes   Education Details picking up his legs   Person(s) Educated Patient   Methods Explanation;Demonstration;Tactile cues   Comprehension Verbalized understanding;Returned demonstration;Verbal cues required             PT Long Term Goals - 01/07/17 1409      PT LONG TERM GOAL #1   Title  Patient will be independent in home exercise program to improve strength/mobility for better functional independence with ADLs   Time 8   Period Weeks   Status New     PT LONG TERM GOAL #2   Title Patient will increase six minute walk test distance to >1000 for progression to community ambulator and improve gait ability   Baseline 410 ft   Time 8   Period Weeks   Status On-going     PT LONG TERM GOAL #3   Title Patient will increase 10 meter walk test to >1.29m/s as to improve gait speed for better community ambulation and to reduce fall risk   Baseline .52 m/sec   Time 8   Period Weeks   Status On-going     PT LONG TERM GOAL #4   Title Patient will reduce timed up and go to <11 seconds to reduce fall risk and demonstrate improved transfer/gait ability.   Baseline 27.73   Time 8   Period Weeks   Status On-going     PT LONG TERM GOAL #5   Baseline 21.67   Time 8   Period Weeks   Status On-going               Plan - 01/09/17 1405    Clinical Impression Statement Pt presents shuffling feet as he ambulates to parallel bars at start of session. He requires cues for upright posture and forward gaze with all exercises but tolerated all exercises well. Pt performed well with cues to exaggerate motions with stepping activity. He will benefit from continued skilled PT interventions for improved posture, strength, balance, and QOL.   Rehab Potential Good   Clinical Impairments Affecting Rehab Potential This patient presents with 2 personal factors/ comorbidities including current situation and hx of a fall., and 3   body elements including body structures and functions, activity limitations and or participation restrictions : decreased strength abiity to transfer and ambulates with slow gait speed and, decreased mobility.  Patient's condition is evolving. Marland Kitchen    PT Frequency 2x / week   PT Duration 8 weeks   PT Treatment/Interventions Therapeutic exercise;Balance training;Therapeutic  activities;Gait training;Stair training;Neuromuscular re-education;Functional mobility training   PT Next Visit Plan outcome measures and update g codes. Strengthening and balance   PT Home Exercise Plan supine therex given 12/02/16   Consulted and Agree with Plan of Care Patient      Patient will benefit from skilled therapeutic intervention in order to improve the following deficits and impairments:  Abnormal gait,  Decreased activity tolerance, Decreased balance, Difficulty walking, Decreased strength, Impaired flexibility, Pain, Decreased mobility, Decreased endurance, Decreased safety awareness  Visit Diagnosis: Difficulty in walking, not elsewhere classified  Muscle weakness (generalized)     Problem List Patient Active Problem List   Diagnosis Date Noted  . Bradycardia 05/17/2016  . Benign hypertensive heart disease without heart failure 12/23/2012  . Dyspnea on exertion 08/25/2012  . Pedal edema 08/25/2012  . Heart murmur 08/25/2012  . Right inguinal hernia 02/28/2011   [name], PT, DPT [place], [place] S 01/09/2017, 2:06 PM  [place] [facility] SERVICES [address] [place], [place], [address] Phone: [phone]   Fax:  [phone]  Name: Perry [name] MRN: [id number] Date of Birth: [birth year]

## 2017-01-13 ENCOUNTER — Ambulatory Visit: Payer: Medicare Other | Attending: Internal Medicine | Admitting: Physical Therapy

## 2017-01-13 ENCOUNTER — Encounter: Payer: Self-pay | Admitting: Physical Therapy

## 2017-01-13 DIAGNOSIS — R262 Difficulty in walking, not elsewhere classified: Secondary | ICD-10-CM | POA: Insufficient documentation

## 2017-01-13 DIAGNOSIS — M6281 Muscle weakness (generalized): Secondary | ICD-10-CM | POA: Insufficient documentation

## 2017-01-13 NOTE — Therapy (Signed)
[place] [facility] [address] [place], [place], [address] Phone: [phone]   Fax:  [phone]  Physical Therapy Treatment  Patient Details  Name: Perry BaliLacy R [name] MRN: [id number] Date of Birth: 08/15/1928 Referring Provider: Curtis SitesKLEIN, BERT J III  Encounter Date: 01/13/2017      PT End of Session - 01/13/17 1422    Visit Number 12   Number of Visits 17   Date for PT Re-Evaluation 01/28/16   Authorization Type g codes 2/10   PT Start Time 0200   PT Stop Time 0245   PT Time Calculation (min) 45 min   Equipment Utilized During Treatment Gait belt   Activity Tolerance Patient tolerated treatment well   Behavior During Therapy [facility] for tasks assessed/performed      Past Medical History:  Diagnosis Date  . Arthritis   . Heart attack ([facility])   . Hyperlipidemia   . Hypertension   . Parkinson's disease ([facility])    lower extremity   . S/P TURP   . Urinary incontinence     Past Surgical History:  Procedure Laterality Date  . CHOLECYSTECTOMY    . HERNIA REPAIR    . HIP FUSION     lt  . KNEE SURGERY     rt knee  . PROSTATE SURGERY    . TONSILLECTOMY    . WRIST SURGERY      There were no vitals filed for this visit.      Subjective Assessment - 01/13/17 1400    Subjective Patient is doing well today. He reports that he is doing his exercises at home.    Patient is accompained by: Family member   Pertinent History  CAD, depression wiht anxiety, diverticulosis, hyperlipidemia, HTN, Hyperthyroidism, leg edama, OA, palpatiions, BLE hip replacement, R TKR, He fell the end of december 2017, he went to the hospital and found out that he had a bladder infection.    Limitations Walking;Standing   How long can you sit comfortably? unlimited   How long can you stand comfortably? 10 mins   How long can you walk comfortably? 10 mins   Patient Stated Goals to be able to get up off the floor   Currently in Pain? No/denies   Pain Score  0-No pain   Pain Onset More than a month ago   Multiple Pain Sites No     Therapeutic exercise;  standing hip abd with YTB x 20 , cues for longer step length on LLE side stepping left and right in parallel bars 10 feet x 3, cues for longer step length on LLE standing on 1/2 foam head turns with  x 20  step ups from floor to 6 inch stool x 20 bilaterally, cues to step to the middle of the stool stepping pattern with weight shifting fwd/bwd x 10.x 5 , cues for head position and toe up right side, heel up left side Stepping and arm swing in parallel bars  , cues for head up and arm swing  Gait training: Gait with reciprocal arm swing and spc 300 feet with continuous verbal cuing for head position and longer left foot swing.                               PT Education - 01/13/17 1421    Education provided Yes   Education Details stepping and weight shifting    Person(s) Educated Patient  Methods Explanation   Comprehension Verbalized understanding;Returned demonstration;Verbal cues required             PT Long Term Goals - 01/07/17 1409      PT LONG TERM GOAL #1   Title Patient will be independent in home exercise program to improve strength/mobility for better functional independence with ADLs   Time 8   Period Weeks   Status New     PT LONG TERM GOAL #2   Title Patient will increase six minute walk test distance to >1000 for progression to community ambulator and improve gait ability   Baseline 410 ft   Time 8   Period Weeks   Status On-going     PT LONG TERM GOAL #3   Title Patient will increase 10 meter walk test to >1.39m/s as to improve gait speed for better community ambulation and to reduce fall risk   Baseline .52 m/sec   Time 8   Period Weeks   Status On-going     PT LONG TERM GOAL #4   Title Patient will reduce timed up and go to <11 seconds to reduce fall risk and demonstrate improved transfer/gait ability.   Baseline 27.73    Time 8   Period Weeks   Status On-going     PT LONG TERM GOAL #5   Baseline 21.67   Time 8   Period Weeks   Status On-going               Plan - 01/13/17 1423    Clinical Impression Statement Patient performed balance exerises with weight shifting forwards and  bigger steps and concentrating on better posture. He needs constatnt cuing for head position and taking bigger step on LLE and heel toe gait. He will continue to benefit from skilled PT to improve gait , safety and mobility.    Rehab Potential Good   Clinical Impairments Affecting Rehab Potential This patient presents with 2 personal factors/ comorbidities including current situation and hx of a fall., and 3   body elements including body structures and functions, activity limitations and or participation restrictions : decreased strength abiity to transfer and ambulates with slow gait speed and, decreased mobility.  Patient's condition is evolving. Marland Kitchen    PT Frequency 2x / week   PT Duration 8 weeks   PT Treatment/Interventions Therapeutic exercise;Balance training;Therapeutic activities;Gait training;Stair training;Neuromuscular re-education;Functional mobility training   PT Next Visit Plan outcome measures and update g codes. Strengthening and balance   PT Home Exercise Plan supine therex given 12/02/16   Consulted and Agree with Plan of Care Patient      Patient will benefit from skilled therapeutic intervention in order to improve the following deficits and impairments:  Abnormal gait, Decreased activity tolerance, Decreased balance, Difficulty walking, Decreased strength, Impaired flexibility, Pain, Decreased mobility, Decreased endurance, Decreased safety awareness  Visit Diagnosis: Difficulty in walking, not elsewhere classified  Muscle weakness (generalized)     Problem List Patient Active Problem List   Diagnosis Date Noted  . Bradycardia 05/17/2016  . Benign hypertensive heart disease without heart failure  12/23/2012  . Dyspnea on exertion 08/25/2012  . Pedal edema 08/25/2012  . Heart murmur 08/25/2012  . Right inguinal hernia 02/28/2011   [name], PT, DPT [place], [place] S 01/13/2017, 2:55 PM  [place] [facility] MAIN [facility] SERVICES [address] [place], [place], [address] Phone: [phone]   Fax:  [phone]  Name: Perry [name] MRN: [id number] Date of  Birth: May 19, 1929

## 2017-01-15 ENCOUNTER — Encounter: Payer: Self-pay | Admitting: Physical Therapy

## 2017-01-15 ENCOUNTER — Ambulatory Visit: Payer: Medicare Other | Admitting: Physical Therapy

## 2017-01-15 DIAGNOSIS — M6281 Muscle weakness (generalized): Secondary | ICD-10-CM

## 2017-01-15 DIAGNOSIS — R262 Difficulty in walking, not elsewhere classified: Secondary | ICD-10-CM

## 2017-01-15 NOTE — Therapy (Signed)
[facility] MAIN [facility] SERVICES [address] [place], [place], [address] Phone: [phone]   Fax:  [phone]  Physical Therapy Treatment  Patient Details  Name: Perry [name] MRN: [id number] Date of Birth: 10/23/1928 Referring Provider: Curtis Sites III  Encounter Date: 01/15/2017      PT End of Session - 01/15/17 1406    Visit Number 13   Number of Visits 17   Date for PT Re-Evaluation 2016-01-30   Authorization Type g codes 10-22-2022   PT Start Time 0200   PT Stop Time 0245   PT Time Calculation (min) 45 min   Equipment Utilized During Treatment Gait belt   Activity Tolerance Patient tolerated treatment well   Behavior During Therapy WFL for tasks assessed/performed      Past Medical History:  Diagnosis Date  . Arthritis   . Heart attack (HCC)   . Hyperlipidemia   . Hypertension   . Parkinson's disease (HCC)    lower extremity   . S/P TURP   . Urinary incontinence     Past Surgical History:  Procedure Laterality Date  . CHOLECYSTECTOMY    . HERNIA REPAIR    . HIP FUSION     lt  . KNEE SURGERY     rt knee  . PROSTATE SURGERY    . TONSILLECTOMY    . WRIST SURGERY      There were no vitals filed for this visit.      Subjective Assessment - 01/15/17 1405    Subjective Patient is doing well today. He reports that he is doing his exercises at home.    Patient is accompained by: Family member   Pertinent History  CAD, depression wiht anxiety, diverticulosis, hyperlipidemia, HTN, Hyperthyroidism, leg edama, OA, palpatiions, BLE hip replacement, R TKR, He fell the end of december 2017, he went to the hospital and found out that he had a bladder infection.    Limitations Walking;Standing   How long can you sit comfortably? unlimited   How long can you stand comfortably? 10 mins   How long can you walk comfortably? 10 mins   Patient Stated Goals to be able to get up off the floor   Currently in Pain? No/denies   Pain Score  0-No pain   Pain Onset More than a month ago   Multiple Pain Sites No      Therapeutic exercise;  standing hip abd with YTB x 20 , cues for longer step length on LLE side stepping left and right in parallel bars 10 feet x 3, cues for longer step length on LLE standing on 1/2 foam head turns with  x 20 , posture cues step ups from floor to 6 inch stool x 20 bilaterally, cues to step to the middle of the stool stepping pattern with weight shifting fwd/bwd x 10.x 5 , cues for head position and toe up right side, heel up left side Stepping and arm swing in parallel bars  , cues for head up and arm swing Rock and reach with arm swing x 10 and changing legs x 10, cues Standing trunk rotation left and right x 10 Tapping stepping stones , reciprocally with soft touch x 20 occasional UE support  Gait training: Gait with reciprocal arm swing and spc 300 feet with continuous verbal cuing for head position and longer left foot swing.x 3 sets  PT Education - 01/15/17 1406    Education provided Yes   Education Details weight shifting activities and safety   Person(s) Educated Patient   Methods Explanation   Comprehension Verbalized understanding             PT Long Term Goals - 01/07/17 1409      PT LONG TERM GOAL #1   Title Patient will be independent in home exercise program to improve strength/mobility for better functional independence with ADLs   Time 8   Period Weeks   Status New     PT LONG TERM GOAL #2   Title Patient will increase six minute walk test distance to >1000 for progression to community ambulator and improve gait ability   Baseline 410 ft   Time 8   Period Weeks   Status On-going     PT LONG TERM GOAL #3   Title Patient will increase 10 meter walk test to >1.6271m/s as to improve gait speed for better community ambulation and to reduce fall risk   Baseline .52 m/sec   Time 8   Period Weeks   Status On-going      PT LONG TERM GOAL #4   Title Patient will reduce timed up and go to <11 seconds to reduce fall risk and demonstrate improved transfer/gait ability.   Baseline 27.73   Time 8   Period Weeks   Status On-going     PT LONG TERM GOAL #5   Baseline 21.67   Time 8   Period Weeks   Status On-going               Plan - 01/15/17 1407    Clinical Impression Statement Pt continues to make improvement in bilateral LE strength and is able to improve step length and arm swing  Today, with less cues for head up. He moves slowly and does fatigue relatively quickly. Pt encouraged to continue HEP and follow-up as scheduled.    Rehab Potential Good   Clinical Impairments Affecting Rehab Potential This patient presents with 2 personal factors/ comorbidities including current situation and hx of a fall., and 3   body elements including body structures and functions, activity limitations and or participation restrictions : decreased strength abiity to transfer and ambulates with slow gait speed and, decreased mobility.  Patient's condition is evolving. Marland Kitchen.    PT Frequency 2x / week   PT Duration 8 weeks   PT Treatment/Interventions Therapeutic exercise;Balance training;Therapeutic activities;Gait training;Stair training;Neuromuscular re-education;Functional mobility training   PT Next Visit Plan outcome measures and update g codes. Strengthening and balance   PT Home Exercise Plan supine therex given 12/02/16   Consulted and Agree with Plan of Care Patient      Patient will benefit from skilled therapeutic intervention in order to improve the following deficits and impairments:  Abnormal gait, Decreased activity tolerance, Decreased balance, Difficulty walking, Decreased strength, Impaired flexibility, Pain, Decreased mobility, Decreased endurance, Decreased safety awareness  Visit Diagnosis: Difficulty in walking, not elsewhere classified  Muscle weakness (generalized)     Problem  List Patient Active Problem List   Diagnosis Date Noted  . Bradycardia 05/17/2016  . Benign hypertensive heart disease without heart failure 12/23/2012  . Dyspnea on exertion 08/25/2012  . Pedal edema 08/25/2012  . Heart murmur 08/25/2012  . Right inguinal hernia 02/28/2011   Ezekiel InaKristine S Mansfield, PT, DPT [place], [place] 01/15/2017, 2:09 PM  [facility] [address]  [place], [place], [address] Phone: [phone]   Fax:  [phone]  Name: Perry [name] MRN: [id number] Date of Birth: 1929-06-26

## 2017-01-20 ENCOUNTER — Encounter: Payer: Self-pay | Admitting: Physical Therapy

## 2017-01-20 ENCOUNTER — Ambulatory Visit: Payer: Medicare Other | Admitting: Physical Therapy

## 2017-01-20 DIAGNOSIS — R262 Difficulty in walking, not elsewhere classified: Secondary | ICD-10-CM

## 2017-01-20 DIAGNOSIS — M6281 Muscle weakness (generalized): Secondary | ICD-10-CM

## 2017-01-20 NOTE — Therapy (Signed)
[facility] MAIN [facility] SERVICES [address] [place], [place], [address] Phone: [phone]   Fax:  [phone]  Physical Therapy Treatment  Patient Details  Name: Perry [name] MRN: [id number] Date of Birth: 11-15-1928 Referring Provider: Curtis Sites III  Encounter Date: 01/20/2017      PT End of Session - 01/20/17 1409    Visit Number 14   Number of Visits 17   Date for PT Re-Evaluation 02-11-16   Authorization Type g codes 12/04/22   PT Start Time 0202   PT Stop Time 0245   PT Time Calculation (min) 43 min   Equipment Utilized During Treatment Gait belt   Activity Tolerance Patient tolerated treatment well   Behavior During Therapy [facility] for tasks assessed/performed      Past Medical History:  Diagnosis Date  . Arthritis   . Heart attack (HCC)   . Hyperlipidemia   . Hypertension   . Parkinson's disease (HCC)    lower extremity   . S/P TURP   . Urinary incontinence     Past Surgical History:  Procedure Laterality Date  . CHOLECYSTECTOMY    . HERNIA REPAIR    . HIP FUSION     lt  . KNEE SURGERY     rt knee  . PROSTATE SURGERY    . TONSILLECTOMY    . WRIST SURGERY      There were no vitals filed for this visit.      Subjective Assessment - 01/20/17 1403    Subjective Patient is doing well today. He reports that he is doing his exercises at home.    Patient is accompained by: Family member   Pertinent History  CAD, depression wiht anxiety, diverticulosis, hyperlipidemia, HTN, Hyperthyroidism, leg edama, OA, palpatiions, BLE hip replacement, R TKR, He fell the end of december 2017, he went to the hospital and found out that he had a bladder infection.    Limitations Walking;Standing   How long can you sit comfortably? unlimited   How long can you stand comfortably? 10 mins   How long can you walk comfortably? 10 mins   Patient Stated Goals to be able to get up off the floor   Currently in Pain? Yes   Pain Score 3    Pain Location Hip   Pain Orientation Right   Pain Descriptors / Indicators Aching   Pain Type Chronic pain   Pain Onset More than a month ago   Pain Frequency Constant   Multiple Pain Sites No        Therapeutic exercise;  standing hip abd with YTB x 20 , cues for longer step length on LLE side stepping left and right in parallel bars 10 feet x 3, cues for longer step length on LLE standing on 1/2 foam head turns with  x 20  step ups from floor to 6 inch stool x 20 bilaterally, cues to step to the middle of the stool stepping pattern with weight shifting fwd/bwd x 10.x 5 , cues for head position and toe up right side, heel up left side Stepping and arm swing in parallel bars  , cues for head up and arm swing NMR Airex cone taps alternating LE x 60 seconds; Tandem gait in // bars x 4 laps  CGA and Min to mod verbal cues used throughout with increased in postural sway and LOB most seen with narrow base of support and while on uneven surfaces. Continues to have balance  deficits typical with diagnosis. Patient performs intermediate level exercises without pain behaviors and needs verbal cuing for postural alignment and head positioning Tactile cues and assistance needed to keep lower leg and knee in neutral to avoid compensations with ankle motions.  Gait training: Gait with reciprocal arm swing and spc 300 feet with continuous verbal cuing for head position and longer left foot swing.                           PT Education - 01/20/17 1409    Education provided Yes   Education Details weight shifting activities   Person(s) Educated Patient   Comprehension Verbalized understanding             PT Long Term Goals - 01/07/17 1409      PT LONG TERM GOAL #1   Title Patient will be independent in home exercise program to improve strength/mobility for better functional independence with ADLs   Time 8   Period Weeks   Status New     PT LONG TERM GOAL #2    Title Patient will increase six minute walk test distance to >1000 for progression to community ambulator and improve gait ability   Baseline 410 ft   Time 8   Period Weeks   Status On-going     PT LONG TERM GOAL #3   Title Patient will increase 10 meter walk test to >1.81m/s as to improve gait speed for better community ambulation and to reduce fall risk   Baseline .52 m/sec   Time 8   Period Weeks   Status On-going     PT LONG TERM GOAL #4   Title Patient will reduce timed up and go to <11 seconds to reduce fall risk and demonstrate improved transfer/gait ability.   Baseline 27.73   Time 8   Period Weeks   Status On-going     PT LONG TERM GOAL #5   Baseline 21.67   Time 8   Period Weeks   Status On-going               Plan - 01/20/17 1410    Clinical Impression Statement Pt presents shuffling feet as he ambulates to parallel bars at start of session. He requires cues for upright posture and forward gaze with all exercises but tolerated all exercises well. Pt performed well with cues to exaggerate motions with stepping activity. He will benefit from continued skilled PT interventions for improved posture, strength, balance, and QOL.   Rehab Potential Good   Clinical Impairments Affecting Rehab Potential This patient presents with 2 personal factors/ comorbidities including current situation and hx of a fall., and 3   body elements including body structures and functions, activity limitations and or participation restrictions : decreased strength abiity to transfer and ambulates with slow gait speed and, decreased mobility.  Patient's condition is evolving. Marland Kitchen    PT Frequency 2x / week   PT Duration 8 weeks   PT Treatment/Interventions Therapeutic exercise;Balance training;Therapeutic activities;Gait training;Stair training;Neuromuscular re-education;Functional mobility training   PT Next Visit Plan outcome measures and update g codes. Strengthening and balance   PT Home  Exercise Plan supine therex given 12/02/16   Consulted and Agree with Plan of Care Patient      Patient will benefit from skilled therapeutic intervention in order to improve the following deficits and impairments:  Abnormal gait, Decreased activity tolerance, Decreased balance, Difficulty walking, Decreased strength, Impaired flexibility, Pain, Decreased  mobility, Decreased endurance, Decreased safety awareness  Visit Diagnosis: Difficulty in walking, not elsewhere classified  Muscle weakness (generalized)     Problem List Patient Active Problem List   Diagnosis Date Noted  . Bradycardia 05/17/2016  . Benign hypertensive heart disease without heart failure 12/23/2012  . Dyspnea on exertion 08/25/2012  . Pedal edema 08/25/2012  . Heart murmur 08/25/2012  . Right inguinal hernia 02/28/2011   Ezekiel InaKristine S Jinna Weinman, PT, DPT [place], [place] 01/20/2017, 2:48 PM  [facility] [facility] [address] [place], [place], [address] Phone: [phone]   Fax:  [phone]  Name: Perry [name] MRN: [id number] Date of Birth: 07/23/1929

## 2017-01-22 ENCOUNTER — Ambulatory Visit: Payer: Medicare Other | Admitting: Physical Therapy

## 2017-01-27 ENCOUNTER — Ambulatory Visit: Payer: Medicare Other | Admitting: Physical Therapy

## 2017-01-27 ENCOUNTER — Encounter: Payer: Self-pay | Admitting: Physical Therapy

## 2017-01-27 DIAGNOSIS — R262 Difficulty in walking, not elsewhere classified: Secondary | ICD-10-CM | POA: Diagnosis not present

## 2017-01-27 DIAGNOSIS — M6281 Muscle weakness (generalized): Secondary | ICD-10-CM

## 2017-01-27 NOTE — Therapy (Signed)
[place] MAIN [facility] SERVICES [address] [place], [place], [address] Phone: [phone]   Fax:  [phone]  Physical Therapy Treatment/ re-certification  Patient Details  Name: Perry [name] MRN: [id number] Date of Birth: 05-30-1929 Referring Provider: Tama High III  Encounter Date: 01/27/2017      PT End of Session - 01/27/17 1414    Visit Number 15   Number of Visits 17   Date for PT Re-Evaluation 2017-03-25   Authorization Type g codes 5/10   PT Start Time 0201   PT Stop Time 0245   PT Time Calculation (min) 44 min   Equipment Utilized During Treatment Gait belt   Activity Tolerance Patient tolerated treatment well   Behavior During Therapy WFL for tasks assessed/performed      Past Medical History:  Diagnosis Date  . Arthritis   . Heart attack ([place])   . Hyperlipidemia   . Hypertension   . Parkinson's disease ([place])    lower extremity   . S/P TURP   . Urinary incontinence     Past Surgical History:  Procedure Laterality Date  . CHOLECYSTECTOMY    . HERNIA REPAIR    . HIP FUSION     lt  . KNEE SURGERY     rt knee  . PROSTATE SURGERY    . TONSILLECTOMY    . WRIST SURGERY      There were no vitals filed for this visit.      Subjective Assessment - 01/27/17 1410    Subjective Patient is doing well today. He reports that he is doing his exercises at home. He increased his parkinsons medicine.   Patient is accompained by: Family member   Pertinent History  CAD, depression wiht anxiety, diverticulosis, hyperlipidemia, HTN, Hyperthyroidism, leg edama, OA, palpatiions, BLE hip replacement, R TKR, He fell the end of december 2017, he went to the hospital and found out that he had a bladder infection.    Limitations Walking;Standing   How long can you sit comfortably? unlimited   How long can you stand comfortably? 10 mins   How long can you walk comfortably? 10 mins   Patient Stated Goals to be able to get up off  the floor   Currently in Pain? No/denies   Pain Score 0-No pain   Pain Onset More than a month ago     Treatment: standing hip abd with YTB x 20 cues for head position and correct technique side stepping left and right in parallel bars 10 feet x 3, cues for head position and correct techniqu standing on blue foam with cone reaching x 20 across midline,cues for head position and correct techniqu step ups from floor to 6 inch stool x 20 bilateral, cues for head position and correct techniqu sit to stand x 10 marching in parallel bars x 20 stepping pattern with weight shifting fwd/bwd x 10. ,cues for head position and correct technique Gait training : Gait training with arm swing big and larger steps and no AD                           PT Education - 01/27/17 1413    Education provided Yes   Education Details HEP weight shifting    Person(s) Educated Patient   Methods Explanation   Comprehension Verbalized understanding             PT Long Term Goals - 01/27/17 1439  PT LONG TERM GOAL #1   Title Patient will be independent in home exercise program to improve strength/mobility for better functional independence with ADLs   Time 8   Period Weeks   Status Partially Met     PT LONG TERM GOAL #2   Title Patient will increase six minute walk test distance to >1000 for progression to community ambulator and improve gait ability   Baseline 410 ft   Time 8   Period Weeks   Status Partially Met     PT LONG TERM GOAL #3   Title Patient will increase 10 meter walk test to >1.53ms as to improve gait speed for better community ambulation and to reduce fall risk   Baseline .52 m/sec   Time 8   Period Weeks   Status Partially Met     PT LONG TERM GOAL #4   Title Patient will reduce timed up and go to <11 seconds to reduce fall risk and demonstrate improved transfer/gait ability.   Baseline 27.73   Time 8   Period Weeks   Status Partially Met     PT  LONG TERM GOAL #5   Baseline 21.67   Time 8   Period Weeks   Status Partially Met               Plan - [id number]    Clinical Impression Statement Pt is making improvements in BLE strength and balance as evidenced by improvements in his 5xSTS and TUG times. He has met his goal with the 173mand almost double his gait speed.. Marland Kitchene demonstrates instability with posterior ambulation and when weight shifting fwd/ bwd. He needs cues to take longer steps and not to shuffle his feet.  He will benefit from continued skilled PT interventions for improved strength, balance, and quality of life.   Rehab Potential Good   Clinical Impairments Affecting Rehab Potential This patient presents with 2 personal factors/ comorbidities including current situation and hx of a fall., and 3   body elements including body structures and functions, activity limitations and or participation restrictions : decreased strength abiity to transfer and ambulates with slow gait speed and, decreased mobility.  Patient's condition is evolving. . Marland Kitchen  PT Frequency 2x / week   PT Duration 8 weeks   PT Treatment/Interventions Therapeutic exercise;Balance training;Therapeutic activities;Gait training;Stair training;Neuromuscular re-education;Functional mobility training   PT Next Visit Plan outcome measures and update g codes. Strengthening and balance   PT Home Exercise Plan supine therex given 12/02/16   Consulted and Agree with Plan of Care Patient      Patient will benefit from skilled therapeutic intervention in order to improve the following deficits and impairments:  Abnormal gait, Decreased activity tolerance, Decreased balance, Difficulty walking, Decreased strength, Impaired flexibility, Pain, Decreased mobility, Decreased endurance, Decreased safety awareness  Visit Diagnosis: Difficulty in walking, not elsewhere classified  Muscle weakness (generalized)       G-Codes - [id number]    Functional Assessment  Tool Used (Outpatient Only) TUG, 10 MW, 6 MW, 5 x sit to  stand   Functional Limitation Mobility: Walking and moving around   Mobility: Walking and Moving Around Current Status ([phone] least 60 percent but less than 80 percent impaired, limited or restricted   Mobility: Walking and Moving Around Goal Status ([phone] least 40 percent but less than 60 percent impaired, limited or restricted      Problem List Patient Active Problem List   Diagnosis Date Noted  .  Bradycardia 05/17/2016  . Benign hypertensive heart disease without heart failure 12/23/2012  . Dyspnea on exertion 08/25/2012  . Pedal edema 08/25/2012  . Heart murmur 08/25/2012  . Right inguinal hernia 02/28/2011  Alanson Puls, PT, DPT  [place], [place] S 01/27/2017, 2:55 PM  [place] MAIN [facility] SERVICES [address] [place], [place], [address] Phone: [phone]   Fax:  [phone]  Name: Perry [name] MRN: [id number] Date of Birth: [birth year]

## 2017-01-29 ENCOUNTER — Encounter: Payer: Self-pay | Admitting: Physical Therapy

## 2017-01-29 ENCOUNTER — Ambulatory Visit: Payer: Medicare Other | Admitting: Physical Therapy

## 2017-01-29 DIAGNOSIS — M6281 Muscle weakness (generalized): Secondary | ICD-10-CM

## 2017-01-29 DIAGNOSIS — R262 Difficulty in walking, not elsewhere classified: Secondary | ICD-10-CM

## 2017-01-29 NOTE — Therapy (Signed)
[place] MAIN [facility] SERVICES [address] [place], [place], [address] Phone: [phone]   Fax:  [phone]  Physical Therapy Treatment  Patient Details  Name: Perry [name] MRN: [id number] Date of Birth: 06/10/1929 Referring Provider: Tama High III  Encounter Date: 01/29/2017      PT End of Session - 01/29/17 1406    Visit Number 16   Number of Visits 17   Date for PT Re-Evaluation 04/23/2017   Authorization Type g codes 6/10   PT Start Time 0201   PT Stop Time 0245   PT Time Calculation (min) 44 min   Equipment Utilized During Treatment Gait belt   Activity Tolerance Patient tolerated treatment well   Behavior During Therapy WFL for tasks assessed/performed      Past Medical History:  Diagnosis Date  . Arthritis   . Heart attack ([place])   . Hyperlipidemia   . Hypertension   . Parkinson's disease ([place])    lower extremity   . S/P TURP   . Urinary incontinence     Past Surgical History:  Procedure Laterality Date  . CHOLECYSTECTOMY    . HERNIA REPAIR    . HIP FUSION     lt  . KNEE SURGERY     rt knee  . PROSTATE SURGERY    . TONSILLECTOMY    . WRIST SURGERY      There were no vitals filed for this visit.      Subjective Assessment - 01/29/17 1405    Subjective Patient says that he feels dizzy today. His BP is 140/67 and HR is 59 bpm   Currently in Pain? No/denies   Pain Score 0-No pain   Multiple Pain Sites No       Treatment; Fwd stepping and backward stepping over opposite LE x 20 with BUE support Weight shifting fwd/ bwd with single UE support x 20  Step ups to 6 inch stool x 20  Eccentric step downs from 3 inch stool  Fwd step with hand out to the side x 10 left and 10 x right  Side step with hands out to the side x 10 left and x 10 right  bwd step with hands out to the side x 10 left and x 10 right Sit to stand x 10 reps without UE, with cues to lean fwd Side stepping in parallel bars  GTB x 5  lengths  Min cueing needed to appropriately perform exercise tasks with leg, hand, and head position. Decreased coordination demonstrated requiring consistent verbal cueing to correct form                          PT Education - 01/29/17 1406    Education provided Yes   Education Details HEP   Person(s) Educated Patient   Methods Explanation   Comprehension Verbalized understanding             PT Long Term Goals - 01/27/17 1439      PT LONG TERM GOAL #1   Title Patient will be independent in home exercise program to improve strength/mobility for better functional independence with ADLs   Time 8   Period Weeks   Status Partially Met     PT LONG TERM GOAL #2   Title Patient will increase six minute walk test distance to >1000 for progression to community ambulator and improve gait ability   Baseline 410 ft   Time  8   Period Weeks   Status Partially Met     PT LONG TERM GOAL #3   Title Patient will increase 10 meter walk test to >1.12ms as to improve gait speed for better community ambulation and to reduce fall risk   Baseline .52 m/sec   Time 8   Period Weeks   Status Partially Met     PT LONG TERM GOAL #4   Title Patient will reduce timed up and go to <11 seconds to reduce fall risk and demonstrate improved transfer/gait ability.   Baseline 27.73   Time 8   Period Weeks   Status Partially Met     PT LONG TERM GOAL #5   Baseline 21.67   Time 8   Period Weeks   Status Partially Met               Plan - 01/29/17 1408    Clinical Impression Statement Patient performed balance exerises with weight shifting forwards and bigger steps and concentrating on better posture. He needs constatnt cuing for head position and taking bigger step on LLE and heel toe gait. He will continue to benefit from skilled PT to improve gait , safety and mobility   Rehab Potential Good   Clinical Impairments Affecting Rehab Potential This patient presents with 2  personal factors/ comorbidities including current situation and hx of a fall., and 3   body elements including body structures and functions, activity limitations and or participation restrictions : decreased strength abiity to transfer and ambulates with slow gait speed and, decreased mobility.  Patient's condition is evolving. .Marland Kitchen   PT Frequency 2x / week   PT Duration 8 weeks   PT Treatment/Interventions Therapeutic exercise;Balance training;Therapeutic activities;Gait training;Stair training;Neuromuscular re-education;Functional mobility training   PT Next Visit Plan outcome measures and update g codes. Strengthening and balance   PT Home Exercise Plan supine therex given 12/02/16   Consulted and Agree with Plan of Care Patient      Patient will benefit from skilled therapeutic intervention in order to improve the following deficits and impairments:  Abnormal gait, Decreased activity tolerance, Decreased balance, Difficulty walking, Decreased strength, Impaired flexibility, Pain, Decreased mobility, Decreased endurance, Decreased safety awareness  Visit Diagnosis: Difficulty in walking, not elsewhere classified  Muscle weakness (generalized)     Problem List Patient Active Problem List   Diagnosis Date Noted  . Bradycardia 05/17/2016  . Benign hypertensive heart disease without heart failure 12/23/2012  . Dyspnea on exertion 08/25/2012  . Pedal edema 08/25/2012  . Heart murmur 08/25/2012  . Right inguinal hernia 02/28/2011   KAlanson Puls PT, DPT MKendall KMinette HeadlandS 01/29/2017, 2:08 PM  [place] [facility]SERV[facility] [address] [address]Phone: [phone]  Fax:  [phone] Name: Perry LOFTUSMRN: [id number] of Birth: [birth year]

## 2017-02-04 ENCOUNTER — Ambulatory Visit: Payer: Medicare Other | Admitting: Physical Therapy

## 2017-02-04 ENCOUNTER — Encounter: Payer: Self-pay | Admitting: Physical Therapy

## 2017-02-04 DIAGNOSIS — M6281 Muscle weakness (generalized): Secondary | ICD-10-CM

## 2017-02-04 DIAGNOSIS — R262 Difficulty in walking, not elsewhere classified: Secondary | ICD-10-CM | POA: Diagnosis not present

## 2017-02-04 NOTE — Therapy (Signed)
[place] MAIN [facility] SERVICES [address] [place], [place], [address] Phone: [phone]   Fax:  [phone]  Physical Therapy Treatment  Patient Details  Name: Perry [name] MRN: [id number] Date of Birth: May 19, 1929 Referring Provider: Tama High III  Encounter Date: 02/04/2017      PT End of Session - 02/04/17 1523    Visit Number 17   Number of Visits 17   Date for PT Re-Evaluation 02-Apr-2017   Authorization Type g codes 7/10   PT Start Time 0315   PT Stop Time 0400   PT Time Calculation (min) 45 min   Equipment Utilized During Treatment Gait belt   Activity Tolerance Patient tolerated treatment well   Behavior During Therapy WFL for tasks assessed/performed      Past Medical History:  Diagnosis Date  . Arthritis   . Heart attack ([place])   . Hyperlipidemia   . Hypertension   . Parkinson's disease ([place])    lower extremity   . S/P TURP   . Urinary incontinence     Past Surgical History:  Procedure Laterality Date  . CHOLECYSTECTOMY    . HERNIA REPAIR    . HIP FUSION     lt  . KNEE SURGERY     rt knee  . PROSTATE SURGERY    . TONSILLECTOMY    . WRIST SURGERY      There were no vitals filed for this visit.      Subjective Assessment - 02/04/17 1521    Subjective Patient says that he feels better today.   Patient is accompained by: Family member   Pertinent History  CAD, depression wiht anxiety, diverticulosis, hyperlipidemia, HTN, Hyperthyroidism, leg edama, OA, palpatiions, BLE hip replacement, R TKR, He fell the end of december 2017, he went to the hospital and found out that he had a bladder infection.    Limitations Walking;Standing   How long can you sit comfortably? unlimited   How long can you stand comfortably? 10 mins   How long can you walk comfortably? 10 mins   Patient Stated Goals to be able to get up off the floor   Currently in Pain? No/denies   Pain Score 0-No pain   Pain Onset More than a  month ago       Treatment: Reviewed HEP and added clam hip abd x 10 x 2 Therapeutic exercise Quantum double leg press 105# x 10 x 2  sidelying clam x 10 x 2 BLE sidelying hip abd x 10 x 2 BLE , position correction  Sit to stand without UE support x 10;   Neuromuscular Re-education Airex tapping on stepping stone in 1 and 2 cone sequences as called out by therapist without UE support; Side stepping in // bars without UE support x multiple laps with considerable  Cues for posture and technique                           PT Education - 02/04/17 1523    Education provided Yes   Education Details HEP   Person(s) Educated Patient   Methods Explanation   Comprehension Verbalized understanding;Returned demonstration;Verbal cues required             PT Long Term Goals - 01/27/17 1439      PT LONG TERM GOAL #1   Title Patient will be independent in home exercise program to improve strength/mobility for better functional independence with  ADLs   Time 8   Period Weeks   Status Partially Met     PT LONG TERM GOAL #2   Title Patient will increase six minute walk test distance to >1000 for progression to community ambulator and improve gait ability   Baseline 410 ft   Time 8   Period Weeks   Status Partially Met     PT LONG TERM GOAL #3   Title Patient will increase 10 meter walk test to >1.76ms as to improve gait speed for better community ambulation and to reduce fall risk   Baseline .52 m/sec   Time 8   Period Weeks   Status Partially Met     PT LONG TERM GOAL #4   Title Patient will reduce timed up and go to <11 seconds to reduce fall risk and demonstrate improved transfer/gait ability.   Baseline 27.73   Time 8   Period Weeks   Status Partially Met     PT LONG TERM GOAL #5   Baseline 21.67   Time 8   Period Weeks   Status Partially Met               Plan - 02/04/17 1524    Clinical Impression Statement Focused on advancement of  sitting exercises and performing more exercises in standing today to improve LE strength and improving endurance. Patient demonstrates increased postural sway and LOB posteriorly requiring UEs to maintain balance and VCs to shift weight forward. Patient will benefit from further skilled therapy focused on improving strength and balance to return to prior level of function   Rehab Potential Good   Clinical Impairments Affecting Rehab Potential This patient presents with 2 personal factors/ comorbidities including current situation and hx of a fall., and 3   body elements including body structures and functions, activity limitations and or participation restrictions : decreased strength abiity to transfer and ambulates with slow gait speed and, decreased mobility.  Patient's condition is evolving. .Marland Kitchen   PT Frequency 2x / week   PT Duration 8 weeks   PT Treatment/Interventions Therapeutic exercise;Balance training;Therapeutic activities;Gait training;Stair training;Neuromuscular re-education;Functional mobility training   PT Next Visit Plan outcome measures and update g codes. Strengthening and balance   PT Home Exercise Plan supine therex given 12/02/16   Consulted and Agree with Plan of Care Patient      Patient will benefit from skilled therapeutic intervention in order to improve the following deficits and impairments:  Abnormal gait, Decreased activity tolerance, Decreased balance, Difficulty walking, Decreased strength, Impaired flexibility, Pain, Decreased mobility, Decreased endurance, Decreased safety awareness  Visit Diagnosis: Difficulty in walking, not elsewhere classified  Muscle weakness (generalized)     Problem List Patient Active Problem List   Diagnosis Date Noted  . Bradycardia 05/17/2016  . Benign hypertensive heart disease without heart failure 12/23/2012  . Dyspnea on exertion 08/25/2012  . Pedal edema 08/25/2012  . Heart murmur 08/25/2012  . Right inguinal hernia  02/28/2011   KAlanson Puls PT, DPT MSaint Marks KMinette HeadlandS 02/04/2017, 3:25 PM  CHansvilleMAIN RSouth Westwood Hills Medical End[facility] IncSERVICES [address] [address]Phone: [phone]  Fax:  [phone] Name: LSHAWON DENZERMRN: [id number] of Birth: [birth year]

## 2017-02-04 NOTE — Patient Instructions (Addendum)
Abduction: Clam (Eccentric) - Side-Lying    Lie on side with knees bent. Lift top knee, keeping feet together. Keep trunk steady. Slowly lower for 3-5 seconds. _10__ reps per set, __2_ sets per day, __7_ days per week. Add ___ lbs when you achieve ___ repetitions.  http://ecce.exer.us/64   Copyright  VHI. All rights reserved.

## 2017-02-06 ENCOUNTER — Ambulatory Visit: Payer: Medicare Other | Admitting: Physical Therapy

## 2017-02-06 ENCOUNTER — Encounter: Payer: Self-pay | Admitting: Physical Therapy

## 2017-02-06 DIAGNOSIS — R262 Difficulty in walking, not elsewhere classified: Secondary | ICD-10-CM | POA: Diagnosis not present

## 2017-02-06 DIAGNOSIS — M6281 Muscle weakness (generalized): Secondary | ICD-10-CM

## 2017-02-06 NOTE — Therapy (Signed)
[place] MAIN [facility] SERVICES [address] [place], [place], [address] Phone: [phone]   Fax:  [phone]  Physical Therapy Treatment  Patient Details  Name: Perry [name] MRN: [id number] Date of Birth: 26-Nov-1928 Referring Provider: Tama High III  Encounter Date: 02/06/2017      PT End of Session - 02/06/17 1545    Visit Number 18   Number of Visits 17   Date for PT Re-Evaluation 2017/03/30   Authorization Type g codes 8/10   PT Start Time 0230   PT Stop Time 0315   PT Time Calculation (min) 45 min   Equipment Utilized During Treatment Gait belt   Activity Tolerance Patient tolerated treatment well   Behavior During Therapy WFL for tasks assessed/performed      Past Medical History:  Diagnosis Date  . Arthritis   . Heart attack ([place])   . Hyperlipidemia   . Hypertension   . Parkinson's disease ([place])    lower extremity   . S/P TURP   . Urinary incontinence     Past Surgical History:  Procedure Laterality Date  . CHOLECYSTECTOMY    . HERNIA REPAIR    . HIP FUSION     lt  . KNEE SURGERY     rt knee  . PROSTATE SURGERY    . TONSILLECTOMY    . WRIST SURGERY      There were no vitals filed for this visit.      Subjective Assessment - 02/06/17 1543    Subjective Patient says that he does not feel that he is doing his exercises correctly and wants PT to check his positions,    Patient is accompained by: Family member   Pertinent History  CAD, depression wiht anxiety, diverticulosis, hyperlipidemia, HTN, Hyperthyroidism, leg edama, OA, palpatiions, BLE hip replacement, R TKR, He fell the end of december 2017, he went to the hospital and found out that he had a bladder infection.    Limitations Walking;Standing   How long can you sit comfortably? unlimited   How long can you stand comfortably? 10 mins   How long can you walk comfortably? 10 mins   Patient Stated Goals to be able to get up off the floor   Currently  in Pain? No/denies   Pain Score 0-No pain   Pain Onset More than a month ago           Leg press: BLE 100# 2x15 with cues to slow down LE movement for better strengthening; x 20 x 3 BLE 75 # ankle PF heel raises x15 with min VCs for positioning for ankle strengthening; x 20 x 3 sidelying abd with correction in LE position, pelvis position and knee position x 10 x 2  Clam in sidelying with correction in foot position and with YTB x 20 x 2 hooklying abd/ER with GTB x 20   Gait training with big arm swing, longer step length and head position cues x 200 feet without AD  Patient is fatigued after treatment session                         PT Education - 02/06/17 1544    Education provided Yes   Education Details HEP   Person(s) Educated Patient   Methods Explanation   Comprehension Verbalized understanding             PT Long Term Goals - 01/27/17 1439  PT LONG TERM GOAL #1   Title Patient will be independent in home exercise program to improve strength/mobility for better functional independence with ADLs   Time 8   Period Weeks   Status Partially Met     PT LONG TERM GOAL #2   Title Patient will increase six minute walk test distance to >1000 for progression to community ambulator and improve gait ability   Baseline 410 ft   Time 8   Period Weeks   Status Partially Met     PT LONG TERM GOAL #3   Title Patient will increase 10 meter walk test to >1.80ms as to improve gait speed for better community ambulation and to reduce fall risk   Baseline .52 m/sec   Time 8   Period Weeks   Status Partially Met     PT LONG TERM GOAL #4   Title Patient will reduce timed up and go to <11 seconds to reduce fall risk and demonstrate improved transfer/gait ability.   Baseline 27.73   Time 8   Period Weeks   Status Partially Met     PT LONG TERM GOAL #5   Baseline 21.67   Time 8   Period Weeks   Status Partially Met               Plan -  02/06/17 1547    Clinical Impression Statement Patient required min verbal cueing during hip exercise to correct posture and form. Patient demonstrates ability to perform strengthening exercises and dynamic standing balance exercises with no increase in pain but with moderate fatigue. Patient will continue to benefit from continued skilled therapy in order to improve dynamic standing balance and increase strength in order to decrease falls   Rehab Potential Good   Clinical Impairments Affecting Rehab Potential This patient presents with 2 personal factors/ comorbidities including current situation and hx of a fall., and 3   body elements including body structures and functions, activity limitations and or participation restrictions : decreased strength abiity to transfer and ambulates with slow gait speed and, decreased mobility.  Patient's condition is evolving. .Marland Kitchen   PT Frequency 2x / week   PT Duration 8 weeks   PT Treatment/Interventions Therapeutic exercise;Balance training;Therapeutic activities;Gait training;Stair training;Neuromuscular re-education;Functional mobility training   PT Next Visit Plan outcome measures and update g codes. Strengthening and balance   PT Home Exercise Plan supine therex given 12/02/16   Consulted and Agree with Plan of Care Patient      Patient will benefit from skilled therapeutic intervention in order to improve the following deficits and impairments:  Abnormal gait, Decreased activity tolerance, Decreased balance, Difficulty walking, Decreased strength, Impaired flexibility, Pain, Decreased mobility, Decreased endurance, Decreased safety awareness  Visit Diagnosis: Difficulty in walking, not elsewhere classified  Muscle weakness (generalized)     Problem List Patient Active Problem List   Diagnosis Date Noted  . Bradycardia 05/17/2016  . Benign hypertensive heart disease without heart failure 12/23/2012  . Dyspnea on exertion 08/25/2012  . Pedal edema  08/25/2012  . Heart murmur 08/25/2012  . Right inguinal hernia 02/28/2011  KAlanson Puls PT, DPT  MLanagan KConnecticutS 02/06/2017, 4:12 PM  CCasc[place]MAIN RMet[facility] LLCSERVICES [address] [address]Phone: [phone]  Fax:  [phone] Name: Perry WILKIEMRN: [id number] of Birth: 3Feb 10, 1930

## 2017-02-11 ENCOUNTER — Ambulatory Visit: Payer: Medicare Other | Attending: Internal Medicine | Admitting: Physical Therapy

## 2017-02-11 ENCOUNTER — Encounter: Payer: Self-pay | Admitting: Physical Therapy

## 2017-02-11 DIAGNOSIS — M6281 Muscle weakness (generalized): Secondary | ICD-10-CM

## 2017-02-11 DIAGNOSIS — R262 Difficulty in walking, not elsewhere classified: Secondary | ICD-10-CM | POA: Insufficient documentation

## 2017-02-11 NOTE — Therapy (Signed)
[place] MAIN [facility] SERVICES [address] [place], [place], [address] Phone: [phone]   Fax:  [phone]  Physical Therapy Treatment  Patient Details  Name: Perry [name] MRN: [id number] Date of Birth: [birth year] Referring Provider: Tama High III  Encounter Date: 02/11/2017      PT End of Session - 02/11/17 1521    Visit Number 19   Number of Visits 33   Date for PT Re-Evaluation April 08, 2017   Authorization Type g codes May 07, 2023   PT Start Time 0230   PT Stop Time 0315   PT Time Calculation (min) 45 min   Equipment Utilized During Treatment Gait belt   Activity Tolerance Patient tolerated treatment well;Patient limited by fatigue   Behavior During Therapy WFL for tasks assessed/performed      Past Medical History:  Diagnosis Date  . Arthritis   . Heart attack ([place])   . Hyperlipidemia   . Hypertension   . Parkinson's disease ([place])    lower extremity   . S/P TURP   . Urinary incontinence     Past Surgical History:  Procedure Laterality Date  . CHOLECYSTECTOMY    . HERNIA REPAIR    . HIP FUSION     lt  . KNEE SURGERY     rt knee  . PROSTATE SURGERY    . TONSILLECTOMY    . WRIST SURGERY      There were no vitals filed for this visit.      Subjective Assessment - 02/11/17 1433    Subjective Patient says that he is doing just fine today and nothing to report.   Patient is accompained by: Family member   Pertinent History  CAD, depression wiht anxiety, diverticulosis, hyperlipidemia, HTN, Hyperthyroidism, leg edama, OA, palpatiions, BLE hip replacement, R TKR, He fell the end of december 2017, he went to the hospital and found out that he had a bladder infection.    Limitations Walking;Standing   How long can you sit comfortably? unlimited   How long can you stand comfortably? 10 mins   How long can you walk comfortably? 10 mins   Patient Stated Goals to be able to get up off the floor   Currently in Pain? No/denies    Pain Onset More than a month ago   Multiple Pain Sites No        Warm up on Nu Step Leg Press 120lbs 2 x 20  Calf raises on leg press 75lbs 2 x 20, cues to keep his knees straight Clamshells 10x each side , cues to flex his knee more Sidelying hip abduction eccentric 5x each. Verbal and tactile cues for proper PROM positioning. Moderate fatigue afterwards Gait training 200 ft, cues for head up, reciprocal swing UE's, big steps Standing hip abduction at // bars 15x each side , cues for correct LE position and technique  Moderate verbal and tactile cues throughout session to improve gait mechanics and proper muscle recruitment.  Frequent rest breaks necessary for fatigue                         PT Education - 02/11/17 1434    Education provided Yes   Education Details HEP to keep progressing balance and gait to increase independence with ADLs   Person(s) Educated Patient;Spouse   Methods Explanation   Comprehension Verbalized understanding             PT Long Term Goals -  01/27/17 1439      PT LONG TERM GOAL #1   Title Patient will be independent in home exercise program to improve strength/mobility for better functional independence with ADLs   Time 8   Period Weeks   Status Partially Met     PT LONG TERM GOAL #2   Title Patient will increase six minute walk test distance to >1000 for progression to community ambulator and improve gait ability   Baseline 410 ft   Time 8   Period Weeks   Status Partially Met     PT LONG TERM GOAL #3   Title Patient will increase 10 meter walk test to >1.41ms as to improve gait speed for better community ambulation and to reduce fall risk   Baseline .52 m/sec   Time 8   Period Weeks   Status Partially Met     PT LONG TERM GOAL #4   Title Patient will reduce timed up and go to <11 seconds to reduce fall risk and demonstrate improved transfer/gait ability.   Baseline 27.73   Time 8   Period Weeks   Status  Partially Met     PT LONG TERM GOAL #5   Baseline 21.67   Time 8   Period Weeks   Status Partially Met               Plan - 02/11/17 1521    Clinical Impression Statement Patient demonstrates flexed posture during ambulation and all standing exercises.  Patient is able to complete therapy session if allowed frequent breaks due to fatigue.  Patient continues to struggle with glute med control but significantly improves with PROM and tactile cues.  Patient displays improvement with balance exercises and will benefit from skilled physical therapy to continue gains in dynamic balance and strength to increase independence with ADLs and standing activities.   Rehab Potential Good   Clinical Impairments Affecting Rehab Potential This patient presents with 2 personal factors/ comorbidities including current situation and hx of a fall., and 3   body elements including body structures and functions, activity limitations and or participation restrictions : decreased strength abiity to transfer and ambulates with slow gait speed and, decreased mobility.  Patient's condition is evolving. .Marland Kitchen   PT Frequency 2x / week   PT Duration 8 weeks   PT Treatment/Interventions Therapeutic exercise;Balance training;Therapeutic activities;Gait training;Stair training;Neuromuscular re-education;Functional mobility training   PT Next Visit Plan outcome measures and update g codes. Strengthening and balance   PT Home Exercise Plan supine therex given 12/02/16   Consulted and Agree with Plan of Care Patient      Patient will benefit from skilled therapeutic intervention in order to improve the following deficits and impairments:  Abnormal gait, Decreased activity tolerance, Decreased balance, Difficulty walking, Decreased strength, Impaired flexibility, Pain, Decreased mobility, Decreased endurance, Decreased safety awareness  Visit Diagnosis: Difficulty in walking, not elsewhere classified  Muscle weakness  (generalized)     Problem List Patient Active Problem List   Diagnosis Date Noted  . Bradycardia 05/17/2016  . Benign hypertensive heart disease without heart failure 12/23/2012  . Dyspnea on exertion 08/25/2012  . Pedal edema 08/25/2012  . Heart murmur 08/25/2012  . Right inguinal hernia 02/28/2011   KAlanson Puls PT, DPT [place] KMinette HeadlandS 02/11/2017, 4:18 PM  [place] [facility] [address] [address]Phone: [phone]  Fax:  [phone] Name: Perry THRUSHMRN: [id number] of  Birth: May 19, 1929

## 2017-02-17 ENCOUNTER — Ambulatory Visit: Payer: Medicare Other | Admitting: Physical Therapy

## 2017-02-17 ENCOUNTER — Encounter: Payer: Self-pay | Admitting: Physical Therapy

## 2017-02-17 DIAGNOSIS — R262 Difficulty in walking, not elsewhere classified: Secondary | ICD-10-CM | POA: Diagnosis not present

## 2017-02-17 DIAGNOSIS — M6281 Muscle weakness (generalized): Secondary | ICD-10-CM

## 2017-02-17 NOTE — Therapy (Signed)
[place] [facility] MAIN [facility] SERVICES [address] [place], [place], [address] Phone: [phone]   Fax:  [phone]  Physical Therapy Treatment  Patient Details  Name: Perry [name] MRN: [id number] Date of Birth: 28-Aug-1928 Referring Provider: Curtis Sites III  Encounter Date: 02/17/2017      PT End of Session - 02/17/17 1438    Visit Number 20   Number of Visits 33   Date for PT Re-Evaluation 03/24/17   Authorization Type 10/10   PT Start Time 1430   PT Stop Time 1515   PT Time Calculation (min) 45 min   Equipment Utilized During Treatment Gait belt   Activity Tolerance Patient tolerated treatment well      Past Medical History:  Diagnosis Date  . Arthritis   . Heart attack ([facility])   . Hyperlipidemia   . Hypertension   . Parkinson's disease ([facility])    lower extremity   . S/P TURP   . Urinary incontinence     Past Surgical History:  Procedure Laterality Date  . CHOLECYSTECTOMY    . HERNIA REPAIR    . HIP FUSION     lt  . KNEE SURGERY     rt knee  . PROSTATE SURGERY    . TONSILLECTOMY    . WRIST SURGERY      There were no vitals filed for this visit.      Subjective Assessment - 02/17/17 1436    Subjective Patient says BP meds arent working too well today and has had a few mild episodes of dizziness with change of position   Patient is accompained by: Family member   Pertinent History  CAD, depression wiht anxiety, diverticulosis, hyperlipidemia, HTN, Hyperthyroidism, leg edama, OA, palpatiions, BLE hip replacement, R TKR, He fell the end of december 2017, he went to the hospital and found out that he had a bladder infection.    Limitations Lifting;Standing   How long can you sit comfortably? unlimited   How long can you stand comfortably? 10 mins   How long can you walk comfortably? 10 mins   Patient Stated Goals to be able to get up off the floor   Currently in Pain? No/denies   Pain Score 0-No pain   Multiple Pain  Sites No       Nu Step warm up 5 minutes  SL Leg Press 75 lbs 2 x 15 each  Sidesteps at // bars with RTB 8 laps (1 lap with no UE assist). Patient required constant cueing to minimize shuffle. Patient externally rotates R hip to use hip flexors when sidestepping to the right that is corrected with tactile and verbal cues. Supine clams with RTB 40x. Patient demonstrates poor eccentric control and heavily favors R muscle activation  Patient needed 4 rest breaks due to fatigue and dizziness today. No pain throughout session.                              PT Education - 02/17/17 1437    Education provided Yes   Education Details Progress HEP and continue building strength to decrease risk of falls    Person(s) Educated Patient;Spouse   Methods Explanation   Comprehension Verbalized understanding             PT Long Term Goals - 02/18/17 0945      PT LONG TERM GOAL #1   Title Patient will be independent in  home exercise program to improve strength/mobility for better functional independence with ADLs   Time 8   Period Weeks   Status On-going     PT LONG TERM GOAL #2   Title Patient will increase six minute walk test distance to >1000 for progression to community ambulator and improve gait ability   Baseline 410 ft   Time 8   Period Weeks   Status On-going     PT LONG TERM GOAL #3   Title Patient will increase 10 meter walk test to >1.77m/s as to improve gait speed for better community ambulation and to reduce fall risk   Baseline .52 m/sec   Time 8   Period Weeks   Status On-going     PT LONG TERM GOAL #4   Title Patient will reduce timed up and go to <11 seconds to reduce fall risk and demonstrate improved transfer/gait ability.   Baseline 27.73   Time 8   Period Weeks   Status On-going     PT LONG TERM GOAL #5   Baseline 21.67   Time 8   Period Weeks   Status On-going               Plan - 02/17/17 1803    Clinical Impression  Statement Patient demonstrates flexed posture during siting and ambulation and still ambulates slowly for safety.  Patient tolerated new therex well and did not experience any pain during therapy today.  Patient still struggled with correct glute med activation but has shown gains in strength and balance.  Patient will continue to improve from skilled physical therapy to imrpove balance and reduce risk of falls.   Rehab Potential Good   Clinical Impairments Affecting Rehab Potential This patient presents with 2 personal factors/ comorbidities including current situation and hx of a fall., and 3   body elements including body structures and functions, activity limitations and or participation restrictions : decreased strength abiity to transfer and ambulates with slow gait speed and, decreased mobility.  Patient's condition is evolving. Marland Kitchen    PT Frequency 2x / week   PT Duration 8 weeks   PT Treatment/Interventions Therapeutic exercise;Balance training;Therapeutic activities;Gait training;Stair training;Neuromuscular re-education;Functional mobility training   PT Next Visit Plan outcome measures and update g codes. Strengthening and balance   PT Home Exercise Plan Reduce use of UE for house ambulation      Patient will benefit from skilled therapeutic intervention in order to improve the following deficits and impairments:  Abnormal gait, Decreased activity tolerance, Decreased balance, Difficulty walking, Decreased strength, Impaired flexibility, Pain, Decreased mobility, Decreased endurance, Decreased safety awareness  Visit Diagnosis: Difficulty in walking, not elsewhere classified  Muscle weakness (generalized)       G-Codes - Feb 28, 2017 0946    Functional Assessment Tool Used (Outpatient Only) TUG, 10 MW, 6 MW, 5 x sit to  stand   Functional Limitation Mobility: Walking and moving around   Mobility: Walking and Moving Around Current Status [phone]) At least 60 percent but less than 80 percent  impaired, limited or restricted   Mobility: Walking and Moving Around Goal Status [phone]) At least 40 percent but less than 60 percent impaired, limited or restricted      Problem List Patient Active Problem List   Diagnosis Date Noted  . Bradycardia 05/17/2016  . Benign hypertensive heart disease without heart failure 12/23/2012  . Dyspnea on exertion 08/25/2012  . Pedal edema 08/25/2012  . Heart murmur 08/25/2012  . Right inguinal hernia 02/28/2011  Madilyn Hookouglas Zhi Geier SPT This entire session was performed under direct supervision and direction of a licensed Estate agenttherapist/therapist assistant . I have personally read, edited and approve of the note as written. [address] Brown CourtMansfield, Kristine S, [place]PT DPT 02/18/2017, 9:47 AM  [place] [facility] [address] [place], [place], [address] Phone: [phone]   Fax:  [phone]  Name: Perry [name] MRN: [id number] Date of Birth: 05/05/1929

## 2017-02-18 DIAGNOSIS — R262 Difficulty in walking, not elsewhere classified: Secondary | ICD-10-CM | POA: Diagnosis not present

## 2017-02-20 ENCOUNTER — Ambulatory Visit: Payer: Medicare Other | Admitting: Physical Therapy

## 2017-02-25 ENCOUNTER — Ambulatory Visit: Payer: Medicare Other

## 2017-02-25 VITALS — BP 142/67 | HR 55

## 2017-02-25 DIAGNOSIS — R262 Difficulty in walking, not elsewhere classified: Secondary | ICD-10-CM

## 2017-02-25 DIAGNOSIS — M6281 Muscle weakness (generalized): Secondary | ICD-10-CM

## 2017-02-25 NOTE — Therapy (Signed)
[place] [facility] [address] [place], [place], [address] Phone: [phone]   Fax:  [phone]  Physical Therapy Treatment  Patient Details  Name: Perry [name] MRN: [id number] Date of Birth: 08/16/1928 Referring Provider: Curtis SitesKLEIN, BERT J III  Encounter Date: 02/25/2017      PT End of Session - 02/25/17 1353    Visit Number 21   Number of Visits 33   Date for PT Re-Evaluation 03/24/17   Authorization Type 11/10   PT Start Time 1350   PT Stop Time 1430   PT Time Calculation (min) 40 min   Equipment Utilized During Treatment Gait belt   Activity Tolerance Patient tolerated treatment well   Behavior During Therapy WFL for tasks assessed/performed      Past Medical History:  Diagnosis Date  . Arthritis   . Heart attack ([facility])   . Hyperlipidemia   . Hypertension   . Parkinson's disease ([facility])    lower extremity   . S/P TURP   . Urinary incontinence     Past Surgical History:  Procedure Laterality Date  . CHOLECYSTECTOMY    . HERNIA REPAIR    . HIP FUSION     lt  . KNEE SURGERY     rt knee  . PROSTATE SURGERY    . TONSILLECTOMY    . WRIST SURGERY      Vitals:   02/25/17 1354  BP: (!) 142/67  Pulse: (!) 55  SpO2: 100%        Subjective Assessment - 02/25/17 1346    Subjective Pt reports he is doing well on this date. No specific questions or concerns currently. Denies pain. Pt reports some occasional dizziness still but not currently.    Patient is accompained by: Family member   Pertinent History  CAD, depression wiht anxiety, diverticulosis, hyperlipidemia, HTN, Hyperthyroidism, leg edama, OA, palpatiions, BLE hip replacement, R TKR, He fell the end of december 2017, he went to the hospital and found out that he had a bladder infection.    Limitations Lifting;Standing   How long can you sit comfortably? unlimited   How long can you stand comfortably? 10 mins   How long can you walk comfortably? 10 mins    Patient Stated Goals to be able to get up off the floor   Currently in Pain? No/denies           TREATMENT  Nu Step warm up BUE/BLE seat position 8, level 2 x 4 minutes during history (2 minutes unbilled);  Single Leg Press 75# x 15, 80# x 15; Sit to stand without UE support 2 x 10; Forward lunging with overhead reach and cervical extension alternating LE x 10 bilateral, cues to explode back with leading leg to improve power; Step-ups to 6" step without UE support alternating LE x 10 each; Sidesteps in // bars with RTB 6 lengths, intermittent finger touching for stability. Cues to avoid hip external rotation which corrected with tactile and verbal cues.   Patient needed intermittent rest breaks due to fatigue today. No pain throughout session.                          PT Education - 02/25/17 1353    Education provided Yes   Education Details Exercise technique and form correction   Person(s) Educated Patient   Methods Explanation   Comprehension Verbalized understanding  PT Long Term Goals - 02/18/17 0945      PT LONG TERM GOAL #1   Title Patient will be independent in home exercise program to improve strength/mobility for better functional independence with ADLs   Time 8   Period Weeks   Status On-going     PT LONG TERM GOAL #2   Title Patient will increase six minute walk test distance to >1000 for progression to community ambulator and improve gait ability   Baseline 410 ft   Time 8   Period Weeks   Status On-going     PT LONG TERM GOAL #3   Title Patient will increase 10 meter walk test to >1.45m/s as to improve gait speed for better community ambulation and to reduce fall risk   Baseline .52 m/sec   Time 8   Period Weeks   Status On-going     PT LONG TERM GOAL #4   Title Patient will reduce timed up and go to <11 seconds to reduce fall risk and demonstrate improved transfer/gait ability.   Baseline 27.73   Time 8    Period Weeks   Status On-going     PT LONG TERM GOAL #5   Baseline 21.67   Time 8   Period Weeks   Status On-going               Plan - 02/25/17 1354    Clinical Impression Statement Pt initially struggles to stand up from transport chair and NuStep without significant UE support. However with transfer practice in parallel bars pt demonstrates improved sit to stand speed. Pt requires intermittent standing rest breaks throughout session secondary to fatigue. Pt will continue to benefit from skilled PT services to address deficits in strength and balance in order to return to prior level of function.    Clinical Presentation Stable   Clinical Decision Making Low   Rehab Potential Good   Clinical Impairments Affecting Rehab Potential This patient presents with 2 personal factors/ comorbidities including current situation and hx of a fall., and 3   body elements including body structures and functions, activity limitations and or participation restrictions : decreased strength abiity to transfer and ambulates with slow gait speed and, decreased mobility.  Patient's condition is evolving. Marland Kitchen    PT Frequency 2x / week   PT Duration 8 weeks   PT Treatment/Interventions Therapeutic exercise;Balance training;Therapeutic activities;Gait training;Stair training;Neuromuscular re-education;Functional mobility training   PT Next Visit Plan Progress strength and balance training   PT Home Exercise Plan Reduce use of UE for house ambulation      Patient will benefit from skilled therapeutic intervention in order to improve the following deficits and impairments:  Abnormal gait, Decreased activity tolerance, Decreased balance, Difficulty walking, Decreased strength, Impaired flexibility, Pain, Decreased mobility, Decreased endurance, Decreased safety awareness  Visit Diagnosis: Difficulty in walking, not elsewhere classified  Muscle weakness (generalized)     Problem List Patient Active  Problem List   Diagnosis Date Noted  . Bradycardia 05/17/2016  . Benign hypertensive heart disease without heart failure 12/23/2012  . Dyspnea on exertion 08/25/2012  . Pedal edema 08/25/2012  . Heart murmur 08/25/2012  . Right inguinal hernia 02/28/2011   Lynnea Maizes PT, DPT   Denesia Donelan 02/25/2017, 3:01 PM  [place] [facility] MAIN [facility] SERVICES [address] [place], [place], [address] Phone: [phone]   Fax:  [phone]  Name: Perry [name] MRN: [id number] Date of Birth: April 12, 1929

## 2017-02-27 ENCOUNTER — Ambulatory Visit: Payer: Medicare Other | Admitting: Physical Therapy

## 2017-02-27 ENCOUNTER — Encounter: Payer: Self-pay | Admitting: Physical Therapy

## 2017-02-27 DIAGNOSIS — M6281 Muscle weakness (generalized): Secondary | ICD-10-CM

## 2017-02-27 DIAGNOSIS — R262 Difficulty in walking, not elsewhere classified: Secondary | ICD-10-CM | POA: Diagnosis not present

## 2017-02-27 NOTE — Therapy (Addendum)
[place] [facility] MAIN [facility] SERVICES [address] [place], [place], [address] Phone: [phone]   Fax:  [phone]  Physical Therapy Treatment  Patient Details  Name: Perry [name] MRN: [id number] Date of Birth: [birth year] Referring Provider: Curtis Sites III  Encounter Date: 02/27/2017      PT End of Session - 02/27/17 1357    Visit Number 22   Number of Visits 33   Date for PT Re-Evaluation 03/24/17   Authorization Type 2/10   PT Start Time 1345   PT Stop Time 1430   PT Time Calculation (min) 45 min   Equipment Utilized During Treatment Gait belt   Activity Tolerance Patient tolerated treatment well   Behavior During Therapy WFL for tasks assessed/performed      Past Medical History:  Diagnosis Date  . Arthritis   . Heart attack (HCC)   . Hyperlipidemia   . Hypertension   . Parkinson's disease (HCC)    lower extremity   . S/P TURP   . Urinary incontinence     Past Surgical History:  Procedure Laterality Date  . CHOLECYSTECTOMY    . HERNIA REPAIR    . HIP FUSION     lt  . KNEE SURGERY     rt knee  . PROSTATE SURGERY    . TONSILLECTOMY    . WRIST SURGERY      There were no vitals filed for this visit.      Subjective Assessment - 02/27/17 1353    Subjective Patient reports he is doing good today and does not express any concers.  Dizziness still occurs when changing position too quickly.   Patient is accompained by: Family member   Pertinent History  CAD, depression wiht anxiety, diverticulosis, hyperlipidemia, HTN, Hyperthyroidism, leg edama, OA, palpatiions, BLE hip replacement, R TKR, He fell the end of december 2017, he went to the hospital and found out that he had a bladder infection.    Limitations Lifting;Standing   How long can you sit comfortably? unlimited   How long can you stand comfortably? 10 mins   How long can you walk comfortably? 10 mins   Patient Stated Goals to be able to get up off the floor    Currently in Pain? No/denies   Pain Score 0-No pain   Multiple Pain Sites No         Treatment:  Nu Step warm up 5 minutes Narrow stance balance on purple foam with eyes closed. 2 x 1 minute. CGA for safety. Moderate LOB but self corrected with UE assist. Fwd lunges onto Bosu 15 x each. Patient instructed to lean onto lead leg and powerfully push off to return to starting position. Patient able to perform without UA assist but occasionally lost balance. Step taps on 6 inch from purple foam. Patient struggles with SLS and needs constant cueing for correct technique.  Single leg leg press 75 lbs 2 x 20. Less fatigue in RLE. Gait in hallway with no assistive device. Patient was able to walk ~ 100 ft with CGA. Gait was terminated due to fatigue. No LOB but patient demonstrates shuffled-gait pattern and significantly slow speed.                            PT Education - 02/27/17 1354    Education provided Yes   Education Details HEP to increase recreational walking   Person(s) Educated Patient;Spouse   Methods  Explanation   Comprehension Verbalized understanding             PT Long Term Goals - 02/18/17 0945      PT LONG TERM GOAL #1   Title Patient will be independent in home exercise program to improve strength/mobility for better functional independence with ADLs   Time 8   Period Weeks   Status On-going     PT LONG TERM GOAL #2   Title Patient will increase six minute walk test distance to >1000 for progression to community ambulator and improve gait ability   Baseline 410 ft   Time 8   Period Weeks   Status On-going     PT LONG TERM GOAL #3   Title Patient will increase 10 meter walk test to >1.96m/s as to improve gait speed for better community ambulation and to reduce fall risk   Baseline .52 m/sec   Time 8   Period Weeks   Status On-going     PT LONG TERM GOAL #4   Title Patient will reduce timed up and go to <11 seconds to reduce fall  risk and demonstrate improved transfer/gait ability.   Baseline 27.73   Time 8   Period Weeks   Status On-going     PT LONG TERM GOAL #5   Baseline 21.67   Time 8   Period Weeks   Status On-going               Plan - 02/27/17 1740    Clinical Impression Statement Patient tolerated therapy well today and rest breaks were not required.  Patient displays lateral lean to the left throughout balance exercises that was unsuccessful to correct with verbal and tactile cues.  Significant strength difference noted in LE; R>L.  Patient showed moderate improvement in balance therex today and did not lose balance as often.  Patient will continue to benefit from skilled physical therapy to reduce fall risk by improving dynamic balance.    Rehab Potential Good   Clinical Impairments Affecting Rehab Potential This patient presents with 2 personal factors/ comorbidities including current situation and hx of a fall., and 3   body elements including body structures and functions, activity limitations and or participation restrictions : decreased strength abiity to transfer and ambulates with slow gait speed and, decreased mobility.  Patient's condition is evolving. .    PT Duration 8 weeks   PT Treatment/Interventions Therapeutic exercise;Balance training;Therapeutic activities;Gait training;Stair training;Neuromuscular re-education;Functional mobility training   PT Next Visit Plan Progress strength and balance training   PT Home Exercise Plan Reduce use of UE for house ambulation   Consulted and Agree with Plan of Care Patient;Family member/caregiver   Family Member Consulted spouse      Patient will benefit from skilled therapeutic intervention in order to improve the following deficits and impairments:  Abnormal gait, Decreased activity tolerance, Decreased balance, Difficulty walking, Decreased strength, Impaired flexibility, Pain, Decreased mobility, Decreased endurance, Decreased safety  awareness  Visit Diagnosis: Difficulty in walking, not elsewhere classified  Muscle weakness (generalized)     Problem List Patient Active Problem List   Diagnosis Date Noted  . Bradycardia 05/17/2016  . Benign hypertensive heart disease without heart failure 12/23/2012  . Dyspnea on exertion 08/25/2012  . Pedal edema 08/25/2012  . Heart murmur 08/25/2012  . Right inguinal hernia 02/28/2011  [name], PT, DPT [facility] SPT  [place], [place] 02/27/2017, 6:00 PM  [place] [facility] MAIN [facility] SERVICES  [address] [place], [place], [address] Phone: [phone]   Fax:  [phone]  Name: Pierre BaliLacy R Cobarrubias MRN: [id number] Date of Birth: 08/19/1928

## 2017-03-04 ENCOUNTER — Ambulatory Visit: Payer: Medicare Other | Admitting: Physical Therapy

## 2017-03-04 ENCOUNTER — Encounter: Payer: Self-pay | Admitting: Physical Therapy

## 2017-03-04 DIAGNOSIS — M6281 Muscle weakness (generalized): Secondary | ICD-10-CM

## 2017-03-04 DIAGNOSIS — R262 Difficulty in walking, not elsewhere classified: Secondary | ICD-10-CM | POA: Diagnosis not present

## 2017-03-04 NOTE — Therapy (Signed)
[place] MAIN [facility] SERVICES [address] [place], [place], [address] Phone: [phone]   Fax:  [phone]  Physical Therapy Treatment  Patient Details  Name: Perry [name] MRN: [id number] Date of Birth: January 15, 1929 Referring Provider: Tama High III  Encounter Date: 03/04/2017      PT End of Session - 03/04/17 1419    Visit Number 23   Number of Visits 33   Date for PT Re-Evaluation 03/24/17   Authorization Type 3/10   PT Start Time 1345   PT Stop Time 1430   PT Time Calculation (min) 45 min   Equipment Utilized During Treatment Gait belt   Activity Tolerance Patient limited by fatigue   Behavior During Therapy WFL for tasks assessed/performed      Past Medical History:  Diagnosis Date  . Arthritis   . Heart attack ([place])   . Hyperlipidemia   . Hypertension   . Parkinson's disease ([place])    lower extremity   . S/P TURP   . Urinary incontinence     Past Surgical History:  Procedure Laterality Date  . CHOLECYSTECTOMY    . HERNIA REPAIR    . HIP FUSION     lt  . KNEE SURGERY     rt knee  . PROSTATE SURGERY    . TONSILLECTOMY    . WRIST SURGERY      There were no vitals filed for this visit.      Subjective Assessment - 03/04/17 1357    Subjective Patient has nothing new to report today. Patient states a 15% improvement since evaluation day.   Patient is accompained by: Family member   Pertinent History  CAD, depression wiht anxiety, diverticulosis, hyperlipidemia, HTN, Hyperthyroidism, leg edama, OA, palpatiions, BLE hip replacement, R TKR, He fell the end of december 2017, he went to the hospital and found out that he had a bladder infection.    Limitations Lifting;Standing   How long can you sit comfortably? unlimited   How long can you stand comfortably? 10 mins   How long can you walk comfortably? 10 mins   Patient Stated Goals to be able to get up off the floor   Currently in Pain? No/denies   Pain Score  0-No pain   Multiple Pain Sites No         Treatment:  Completion of outcome measures (5 STS, 10 MWT, 6 MWT, TUG) Vitals before 6 MWT: BP = 114/58 HR = 51 SpO2 = 96% Vitals after 6 MWT: BP = 154/63 HR = 55 SpO2 = 98%  Patient required rest break during 6 MWT due to fatigue.   TUG was performed with single point cane 10 MWT: patient held cane in L hand but did not use it. 5xSTS: patient was unable to stand up without UE assist.   Education and discussion with patient and patient's wife about discharge plan understood well.  All parties agreed to plan for discharge on 03/06/17.                        PT Education - 03/04/17 1357    Education provided Yes   Education Details Continuing therapy to increase strength and endurance. Plan for discharge in 2 days.   Person(s) Educated Patient;Spouse   Methods Explanation   Comprehension Verbalized understanding             PT Long Term Goals - 03/04/17 1422  PT LONG TERM GOAL #1   Title Patient will be independent in home exercise program to improve strength/mobility for better functional independence with ADLs   Time 8   Period Weeks   Status On-going     PT LONG TERM GOAL #2   Title Patient will increase six minute walk test distance to >1000 for progression to community ambulator and improve gait ability   Baseline 410 ft 03/04/17: 515 ft   Time 8   Period Weeks   Status On-going     PT LONG TERM GOAL #3   Title Patient will increase 10 meter walk test to >1.71ms as to improve gait speed for better community ambulation and to reduce fall risk   Baseline .52 m/sec 03/04/17 .69 m/sec   Time 8   Period Weeks   Status On-going     PT LONG TERM GOAL #4   Title Patient will reduce timed up and go to <11 seconds to reduce fall risk and demonstrate improved transfer/gait ability.   Baseline 27.73 03/04/17: 32.5 sec   Time 8   Period Weeks   Status On-going     PT LONG TERM GOAL #5   Title Patient  (> 675years old) will complete five times sit to stand test in < 15 seconds indicating an increased LE strength and improved balance.   Baseline 21.67 03/04/17: 18.64 sec   Time 8   Period Weeks   Status On-going               Plan - 03/04/17 1434    Clinical Impression Statement Patient performed outcome measures today to track progress. Minimal improvement seen in 5xSTS, and gait speed; good gains in 6 MW test, regression seen with TUG.  DIscussion with patient and patient's wife ensued to discuss plan for discharging and consensus to discharge on the next visit was met.     Rehab Potential Good   Clinical Impairments Affecting Rehab Potential This patient presents with 2 personal factors/ comorbidities including current situation and hx of a fall., and 3   body elements including body structures and functions, activity limitations and or participation restrictions : decreased strength abiity to transfer and ambulates with slow gait speed and, decreased mobility.  Patient's condition is evolving. .Marland Kitchen   PT Frequency 2x / week   PT Duration 8 weeks   PT Treatment/Interventions Therapeutic exercise;Balance training;Therapeutic activities;Gait training;Stair training;Neuromuscular re-education;Functional mobility training   PT Next Visit Plan Progress strength and balance training   PT Home Exercise Plan Reduce use of UE for house ambulation   Consulted and Agree with Plan of Care Patient;Family member/caregiver   Family Member Consulted spouse      Patient will benefit from skilled therapeutic intervention in order to improve the following deficits and impairments:  Abnormal gait, Decreased activity tolerance, Decreased balance, Difficulty walking, Decreased strength, Impaired flexibility, Pain, Decreased mobility, Decreased endurance, Decreased safety awareness  Visit Diagnosis: Difficulty in walking, not elsewhere classified  Muscle weakness (generalized)     Problem  List Patient Active Problem List   Diagnosis Date Noted  . Bradycardia 05/17/2016  . Benign hypertensive heart disease without heart failure 12/23/2012  . Dyspnea on exertion 08/25/2012  . Pedal edema 08/25/2012  . Heart murmur 08/25/2012  . Right inguinal hernia 02/28/2011   [address] [place] DPT 03/04/2017, 3:31 PM  [place] [facility] [address]  [place], [address] Phone: [phone]   Fax:  [phone]  Name: GARRIS MELHORN MRN: [id number] Date of Birth: [birth year]

## 2017-03-06 ENCOUNTER — Ambulatory Visit: Payer: Medicare Other | Admitting: Physical Therapy

## 2017-03-06 ENCOUNTER — Encounter: Payer: Self-pay | Admitting: Physical Therapy

## 2017-03-06 DIAGNOSIS — M6281 Muscle weakness (generalized): Secondary | ICD-10-CM

## 2017-03-06 DIAGNOSIS — R262 Difficulty in walking, not elsewhere classified: Secondary | ICD-10-CM | POA: Diagnosis not present

## 2017-03-06 NOTE — Therapy (Addendum)
[place] [facility] MAIN [facility] SERVICES [address] [place], [place], [address] Phone: [phone]   Fax:  [phone]  Physical Therapy Treatment/Discharge  Patient Details  Name: Perry [name] MRN: [id number] Date of Birth: [birth year] Referring Provider: Curtis Sites III  Encounter Date: 03/06/2017      PT End of Session - 03/06/17 1355    Visit Number 24   Number of Visits 33   Date for PT Re-Evaluation 03/24/17   Authorization Type 4/10   PT Start Time 1345   PT Stop Time 1430   PT Time Calculation (min) 45 min   Equipment Utilized During Treatment Gait belt   Activity Tolerance Patient limited by fatigue   Behavior During Therapy [facility] for tasks assessed/performed      Past Medical History:  Diagnosis Date  . Arthritis   . Heart attack (HCC)   . Hyperlipidemia   . Hypertension   . Parkinson's disease (HCC)    lower extremity   . S/P TURP   . Urinary incontinence     Past Surgical History:  Procedure Laterality Date  . CHOLECYSTECTOMY    . HERNIA REPAIR    . HIP FUSION     lt  . KNEE SURGERY     rt knee  . PROSTATE SURGERY    . TONSILLECTOMY    . WRIST SURGERY      There were no vitals filed for this visit.      Subjective Assessment - 03/06/17 1354    Subjective Patient feels good and stated that he would like to schedule two more appointments.   Patient is accompained by: Family member   Pertinent History  CAD, depression wiht anxiety, diverticulosis, hyperlipidemia, HTN, Hyperthyroidism, leg edama, OA, palpatiions, BLE hip replacement, R TKR, He fell the end of december 2017, he went to the hospital and found out that he had a bladder infection.    Limitations Lifting;Standing   How long can you sit comfortably? unlimited   How long can you stand comfortably? 10 mins   How long can you walk comfortably? 10 mins   Patient Stated Goals to be able to get up off the floor   Currently in Pain? No/denies   Pain Score  0-No pain   Multiple Pain Sites No       Treatment:  Nu Step warm up 5 minutes   Education/demonstration of HEP 3 repetitions of each of the following:  Sidelying hip abduction Supine bridges with theraband SLR Sit to stands Hip flexor stretch in thomas position Supine UE assist HS stretch Prone press ups   Extensive verbal and tactile cues for all exercises to make sure patient understood HEP well.  PT taught patient exercises, then patient and patient's wife taught PT exercises. Patient and patient's wife seemed to understand HEP at end of session.                              PT Education - 03/06/17 1354    Education provided Yes   Education Details HEP instruction    Person(s) Educated Patient;Spouse   Methods Explanation;Demonstration   Comprehension Verbalized understanding;Returned demonstration             PT Long Term Goals - 03/06/17 1651      PT LONG TERM GOAL #1   Title Patient will be independent in home exercise program to improve strength/mobility for better functional independence with  ADLs   Time 8   Period Weeks   Status Achieved     PT LONG TERM GOAL #2   Title Patient will increase six minute walk test distance to >1000 for progression to community ambulator and improve gait ability   Baseline 410 ft 03/04/17: 515 ft   Time 8   Period Weeks   Status On-going     PT LONG TERM GOAL #3   Title Patient will increase 10 meter walk test to >1.4241m/s as to improve gait speed for better community ambulation and to reduce fall risk   Baseline .52 m/sec 03/04/17 .69 m/sec   Time 8   Period Weeks   Status On-going     PT LONG TERM GOAL #4   Title Patient will reduce timed up and go to <11 seconds to reduce fall risk and demonstrate improved transfer/gait ability.   Baseline 27.73 03/04/17: 32.5 sec   Time 8   Period Weeks   Status On-going     PT LONG TERM GOAL #5   Title Patient (> 81 years old) will complete five times  sit to stand test in < 15 seconds indicating an increased LE strength and improved balance.   Baseline 21.67 03/04/17: 18.64 sec   Time 8   Period Weeks   Status On-going               Plan - 03/06/17 1453    Clinical Impression Statement Patient requested to go over HEP with PT to make sure proper technique is used. Elaborate education on 4 strengthening exercises and 3 stretches were understood well after patient demonstrated and taught the HEP.  Pateint educated on reasoning for DC and that no additional visits are necessary. Patient has reached goal #1 and goals #2,#3,#4,#5 are ongoing. Patient is being discharged today.     Rehab Potential Good   Clinical Impairments Affecting Rehab Potential This patient presents with 2 personal factors/ comorbidities including current situation and hx of a fall., and 3   body elements including body structures and functions, activity limitations and or participation restrictions : decreased strength abiity to transfer and ambulates with slow gait speed and, decreased mobility.  Patient's condition is evolving. Marland Kitchen.    PT Treatment/Interventions Therapeutic exercise;Balance training;Therapeutic activities;Gait training;Stair training;Neuromuscular re-education;Functional mobility training   PT Home Exercise Plan Recreationally walk and adhere to HEP handout   Consulted and Agree with Plan of Care Patient;Family member/caregiver   Family Member Consulted spouse      Patient will benefit from skilled therapeutic intervention in order to improve the following deficits and impairments:  Abnormal gait, Decreased activity tolerance, Decreased balance, Difficulty walking, Decreased strength, Impaired flexibility, Pain, Decreased mobility, Decreased endurance, Decreased safety awareness  Visit Diagnosis: Difficulty in walking, not elsewhere classified  Muscle weakness (generalized)     Problem List Patient Active Problem List   Diagnosis Date Noted   . Bradycardia 05/17/2016  . Benign hypertensive heart disease without heart failure 12/23/2012  . Dyspnea on exertion 08/25/2012  . Pedal edema 08/25/2012  . Heart murmur 08/25/2012  . Right inguinal hernia 02/28/2011  This entire session was performed under direct supervision and direction of a licensed therapist/therapist assistant . I have personally read, edited and approve of the note as written. [facility] DivisionDouglas Matisse Salais [address]PT  [place], [place], [place] DPT 03/06/2017, 4:52 PM  [place] [facility] [address] [place], [place], [address] Phone: [phone]   Fax:  [phone]  Name:  Pierre BaliLacy R Fitzgerald MRN: [id number] Date of Birth: 12/03/1928

## 2017-06-06 ENCOUNTER — Emergency Department
Admission: EM | Admit: 2017-06-06 | Discharge: 2017-06-06 | Disposition: A | Discharge status: Home / Self Care | Payer: Medicare Other | Attending: Emergency Medicine | Admitting: Emergency Medicine

## 2017-06-06 ENCOUNTER — Emergency Department: Payer: Medicare Other

## 2017-06-06 ENCOUNTER — Encounter: Payer: Self-pay | Admitting: Emergency Medicine

## 2017-06-06 DIAGNOSIS — Z79899 Other long term (current) drug therapy: Secondary | ICD-10-CM | POA: Insufficient documentation

## 2017-06-06 DIAGNOSIS — I252 Old myocardial infarction: Secondary | ICD-10-CM | POA: Diagnosis not present

## 2017-06-06 DIAGNOSIS — Z87891 Personal history of nicotine dependence: Secondary | ICD-10-CM | POA: Insufficient documentation

## 2017-06-06 DIAGNOSIS — R531 Weakness: Secondary | ICD-10-CM | POA: Diagnosis not present

## 2017-06-06 DIAGNOSIS — Z9181 History of falling: Secondary | ICD-10-CM | POA: Diagnosis not present

## 2017-06-06 DIAGNOSIS — R296 Repeated falls: Secondary | ICD-10-CM | POA: Insufficient documentation

## 2017-06-06 DIAGNOSIS — G20 Parkinson's disease: Secondary | ICD-10-CM | POA: Diagnosis not present

## 2017-06-06 DIAGNOSIS — I10 Essential (primary) hypertension: Secondary | ICD-10-CM | POA: Diagnosis not present

## 2017-06-06 LAB — CBC WITH DIFFERENTIAL/PLATELET
Basophils Absolute: 0 10*3/uL (ref 0–0.1)
Basophils Relative: 1 %
Eosinophils Absolute: 0.2 10*3/uL (ref 0–0.7)
Eosinophils Relative: 3 %
HEMATOCRIT: 34.6 % — AB (ref 40.0–52.0)
HEMOGLOBIN: 11.7 g/dL — AB (ref 13.0–18.0)
LYMPHS ABS: 0.3 10*3/uL — AB (ref 1.0–3.6)
LYMPHS PCT: 5 %
MCH: 33.6 pg (ref 26.0–34.0)
MCHC: 33.8 g/dL (ref 32.0–36.0)
MCV: 99.4 fL (ref 80.0–100.0)
MONO ABS: 0.4 10*3/uL (ref 0.2–1.0)
MONOS PCT: 9 %
NEUTROS ABS: 4 10*3/uL (ref 1.4–6.5)
NEUTROS PCT: 82 %
Platelets: 69 10*3/uL — ABNORMAL LOW (ref 150–440)
RBC: 3.48 MIL/uL — ABNORMAL LOW (ref 4.40–5.90)
RDW: 15.2 % — AB (ref 11.5–14.5)
WBC: 4.8 10*3/uL (ref 3.8–10.6)

## 2017-06-06 LAB — TROPONIN I

## 2017-06-06 LAB — URINALYSIS, COMPLETE (UACMP) WITH MICROSCOPIC
BACTERIA UA: NONE SEEN
Bilirubin Urine: NEGATIVE
GLUCOSE, UA: NEGATIVE mg/dL
Hgb urine dipstick: NEGATIVE
Ketones, ur: NEGATIVE mg/dL
Leukocytes, UA: NEGATIVE
NITRITE: NEGATIVE
PH: 7 (ref 5.0–8.0)
Protein, ur: NEGATIVE mg/dL
SPECIFIC GRAVITY, URINE: 1.011 (ref 1.005–1.030)
SQUAMOUS EPITHELIAL / LPF: NONE SEEN

## 2017-06-06 LAB — COMPREHENSIVE METABOLIC PANEL
ALK PHOS: 50 U/L (ref 38–126)
ALT: <5 U/L — ABNORMAL LOW (ref 17–63)
ANION GAP: 10 (ref 5–15)
AST: 17 U/L (ref 15–41)
Albumin: 3.9 g/dL (ref 3.5–5.0)
BILIRUBIN TOTAL: 0.9 mg/dL (ref 0.3–1.2)
BUN: 28 mg/dL — AB (ref 6–20)
CALCIUM: 8.7 mg/dL — AB (ref 8.9–10.3)
CO2: 26 mmol/L (ref 22–32)
Chloride: 102 mmol/L (ref 101–111)
Creatinine, Ser: 0.81 mg/dL (ref 0.61–1.24)
GFR calc Af Amer: >60 mL/min (ref >60)
Glucose, Bld: 96 mg/dL (ref 65–99)
POTASSIUM: 3.6 mmol/L (ref 3.5–5.1)
Sodium: 138 mmol/L (ref 135–145)
TOTAL PROTEIN: 6.7 g/dL (ref 6.5–8.1)

## 2017-06-06 NOTE — Progress Notes (Signed)
LCSW met with PT and family at bedside and present 2 plus STR centers bed offers.   Awaiting PT note to upload to hub. Called and spoke to [place] at [facility] and they are reviewing the information and may make bed offer. Family is still unsure if he should return home with home health or he should go into STR.  Awaiting family to decide.   Mathan Darroch LCSW

## 2017-06-06 NOTE — [place] FL2 (Signed)
[place] MEDICAID FL2 LEVEL OF CARE SCREENING TOOL     IDENTIFICATION  Patient Name: Perry [name] Birthdate: 1929/06/08 Sex: male Admission Date (Current Location): 06/06/2017  [place] and [place] Number:  Chiropodist and Address:  [facility], [address] [place], [place] [address]      Provider Number: [id number]  Attending Physician Name and Address:  Nita Sickle, MD  Relative Name and Phone Number:       Current Level of Care: Hospital Recommended Level of Care: Skilled Nursing Facility, Assisted Living Facility Prior Approval Number:    Date Approved/Denied:   PASRR Number:  [id number]   Discharge Plan: Home    Current Diagnoses: Patient Active Problem List   Diagnosis Date Noted  . Bradycardia 05/17/2016  . Benign hypertensive heart disease without heart failure 12/23/2012  . Dyspnea on exertion 08/25/2012  . Pedal edema 08/25/2012  . Heart murmur 08/25/2012  . Right inguinal hernia 02/28/2011    Orientation RESPIRATION BLADDER Height & Weight     Self, Time, Situation, Place  Normal Incontinent Weight: 159 lb (72.1 kg) Height:  5\' 9"  (175.3 cm)  BEHAVIORAL SYMPTOMS/MOOD NEUROLOGICAL BOWEL NUTRITION STATUS      Incontinent Diet (normal)  AMBULATORY STATUS COMMUNICATION OF NEEDS Skin   Limited Assist Verbally Skin abrasions (Bed sore on right hio area)                       Personal Care Assistance Level of Assistance  Bathing, Feeding, Dressing, Total care Bathing Assistance: Limited assistance Feeding assistance: Limited assistance Dressing Assistance: Limited assistance Total Care Assistance: Limited assistance   Functional Limitations Info  Sight, Hearing, Speech Sight Info: Adequate Hearing Info: Adequate Speech Info: Adequate    SPECIAL CARE FACTORS FREQUENCY  PT (By licensed PT), OT (By licensed OT)     PT Frequency: x5 OT Frequency: x5            Contractures Contractures  Info: Not present    Additional Factors Info  Allergies   Allergies Info: Tetanus toxiods,Artorvastatin, Rosuvastatain           Current Medications (06/06/2017):  This is the current hospital active medication list No current facility-administered medications for this encounter.    Current Outpatient Prescriptions  Medication Sig Dispense Refill  . arginine 500 MG tablet Take 1,000 mg by mouth 2 (two) times daily.    . B Complex Vitamins (B-COMPLEX/B-12 PO) Take 1 capsule by mouth daily.     . benazepril (LOTENSIN) 20 MG tablet Take 20 mg by mouth 2 (two) times daily.      . calcium-vitamin D (OSCAL WITH D) 500-200 MG-UNIT per tablet Take 1 tablet by mouth daily.      . carvedilol (COREG) 12.5 MG tablet Take 12.5 mg by mouth 2 (two) times daily with a meal.    . Cholecalciferol (VITAMIN D) 2000 UNITS CAPS Take 2,000 Units by mouth daily.     . Coenzyme Q10 (CO Q-10) 100 MG CAPS Take 100 mg by mouth daily.     . cyanocobalamin (V-R VITAMIN B-12) 500 MCG tablet Take by mouth.    . donepezil (ARICEPT) 10 MG tablet Take 10 mg by mouth at bedtime.    Marland Kitchen doxazosin (CARDURA) 8 MG tablet Take 1 tablet (8 mg total) by mouth 2 (two) times daily. 60 tablet 6  . fish oil-omega-3 fatty acids 1000 MG capsule Take 2 g by mouth daily.      Marland Kitchen  fluticasone (FLONASE) 50 MCG/ACT nasal spray Place 2 sprays into both nostrils daily.    . Magnesium 200 MG TABS Take 200 mg by mouth 2 (two) times daily.     . megestrol (MEGACE) 20 MG tablet Take 20 mg by mouth daily.    . mirabegron ER (MYRBETRIQ) 50 MG TB24 Take 100 mg by mouth daily.     . potassium chloride SA (K-DUR,KLOR-CON) 20 MEQ tablet Take 20 mEq by mouth 2 (two) times daily.    . sertraline (ZOLOFT) 50 MG tablet Take 150 mg by mouth at bedtime.     . timolol (TIMOPTIC) 0.5 % ophthalmic solution Place 1 drop into the left eye 2 (two) times daily.     . carbidopa-levodopa (SINEMET IR) 25-100 MG tablet Take 2 tablets by mouth 3 (three) times daily.      . cephALEXin (KEFLEX) 500 MG capsule Take 1 capsule (500 mg total) by mouth 3 (three) times daily. (Patient not taking: Reported on 06/06/2017) 30 capsule 0  . HYDROcodone-acetaminophen (NORCO/VICODIN) 5-325 MG tablet Take 1 tablet by mouth every 4 (four) hours as needed for moderate pain. 20 tablet 0  . tadalafil (CIALIS) 20 MG tablet Take 20 mg by mouth daily as needed. (erectile dysfunction)       Discharge Medications: Please see discharge summary for a list of discharge medications.  Relevant Imaging Results:  Relevant Lab Results:   Additional Information SSN # [id number]  Cheron SchaumannBandi, Ashten Sarnowski M, [place]

## 2017-06-06 NOTE — ED Provider Notes (Signed)
[facility] Emergency Department Provider Note  ____________________________________________  Time seen: Approximately 12:15 PM  I have reviewed the triage vital signs and the nursing notes.   HISTORY  Chief Complaint Weakness   HPI Perry [name] is a 83 y.o. male with a history of CAD, hypertension, Parkinson's disease, hyperlipidemia, melanoma in situ,cataracts status post surgery who presents for evaluation of generalized weakness and multiple falls. Per EMS, they were called to the house today after patient had another fall and was unable to get up. According to patient's wife a shunt has been getting progressively weaker at home with recurrent falls. Wife reports that she is unable to care for him at home and would like for him to be placed. Patient tells me that he has had problems with his balance for several months with recurrent falls. He denies any trauma from these falls. Denies any recent changes on his Parkinson's medication. Last saw his neurologist, Dr. Sherryll Burger in August 2018 with no changes in his Parkinson's medication. Patient denies HA, neck pain, CP, SOB, cough, fever, chills, dysuria, hematuria, diarrhea, abdominal pain. Last BM was yesterday. Patient has walker at home.   Past Medical History:  Diagnosis Date  . Arthritis   . Heart attack (HCC)   . Hyperlipidemia   . Hypertension   . Parkinson's disease (HCC)    lower extremity   . S/P TURP   . Urinary incontinence     Patient Active Problem List   Diagnosis Date Noted  . Bradycardia 05/17/2016  . Benign hypertensive heart disease without heart failure 12/23/2012  . Dyspnea on exertion 08/25/2012  . Pedal edema 08/25/2012  . Heart murmur 08/25/2012  . Right inguinal hernia 02/28/2011    Past Surgical History:  Procedure Laterality Date  . CHOLECYSTECTOMY    . HERNIA REPAIR    . HIP FUSION     lt  . KNEE SURGERY     rt knee  . PROSTATE SURGERY    . TONSILLECTOMY    .  WRIST SURGERY      Prior to Admission medications   Medication Sig Start Date End Date Taking? Authorizing Provider  arginine 500 MG tablet Take 1,000 mg by mouth 2 (two) times daily.   Yes [provider]  B Complex Vitamins (B-COMPLEX/B-12 PO) Take 1 capsule by mouth daily.    Yes [provider]  benazepril (LOTENSIN) 20 MG tablet Take 20 mg by mouth 2 (two) times daily.     Yes [provider]  calcium-vitamin D (OSCAL WITH D) 500-200 MG-UNIT per tablet Take 1 tablet by mouth daily.     Yes [provider]  carvedilol (COREG) 12.5 MG tablet Take 12.5 mg by mouth 2 (two) times daily with a meal.   Yes [provider]  Cholecalciferol (VITAMIN D) 2000 UNITS CAPS Take 2,000 Units by mouth daily.    Yes [provider]  Coenzyme Q10 (CO Q-10) 100 MG CAPS Take 100 mg by mouth daily.    Yes [provider]  cyanocobalamin (V-R VITAMIN B-12) 500 MCG tablet Take by mouth.   Yes [provider]  donepezil (ARICEPT) 10 MG tablet Take 10 mg by mouth at bedtime. 12/23/16 06/06/17 Yes [provider]  doxazosin (CARDURA) 8 MG tablet Take 1 tablet (8 mg total) by mouth 2 (two) times daily. 12/31/16  Yes Gollan, Tollie Pizza, MD  fish oil-omega-3 fatty acids 1000 MG capsule Take 2 g by mouth daily.  Yes [provider]  fluticasone (FLONASE) 50 MCG/ACT nasal spray Place 2 sprays into both nostrils daily.   Yes [provider]  Magnesium 200 MG TABS Take 200 mg by mouth 2 (two) times daily.    Yes [provider]  megestrol (MEGACE) 20 MG tablet Take 20 mg by mouth daily.   Yes [provider]  mirabegron ER (MYRBETRIQ) 50 MG TB24 Take 100 mg by mouth daily.    Yes [provider]  potassium chloride SA (K-DUR,KLOR-CON) 20 MEQ tablet Take 20 mEq by mouth 2 (two) times daily.   Yes [provider]  sertraline (ZOLOFT) 50 MG tablet Take 150 mg by mouth at bedtime.  01/19/16  06/06/17 Yes [provider]  timolol (TIMOPTIC) 0.5 % ophthalmic solution Place 1 drop into the left eye 2 (two) times daily.    Yes [provider]  carbidopa-levodopa (SINEMET IR) 25-100 MG tablet Take 2 tablets by mouth 3 (three) times daily.  01/19/16 07/17/16  [provider]  cephALEXin (KEFLEX) 500 MG capsule Take 1 capsule (500 mg total) by mouth 3 (three) times daily. Patient not taking: Reported on 06/06/2017 08/07/16   Phineas Semen, MD  HYDROcodone-acetaminophen (NORCO/VICODIN) 5-325 MG tablet Take 1 tablet by mouth every 4 (four) hours as needed for moderate pain. 11/29/15   Cuthriell, Delorise Royals, PA-C  tadalafil (CIALIS) 20 MG tablet Take 20 mg by mouth daily as needed. (erectile dysfunction) 01/02/15   [provider]    Allergies Tetanus toxoids; Atorvastatin; and Rosuvastatin  Family History  Problem Relation Age of Onset  . Healthy Mother   . Hypertension Father   . Ovarian cancer Sister   . Appendicitis Brother   . Hypertension Sister   . Heart disease Sister     Social History Social History  Substance Use Topics  . Smoking status: Former Smoker    Types: Cigars  . Smokeless tobacco: Never Used  . Alcohol use No    Review of Systems Constitutional: Negative for fever. + generalized weakness Eyes: Negative for visual changes. ENT: Negative for facial injury or neck injury Cardiovascular: Negative for chest injury. Respiratory: Negative for shortness of breath. Negative for chest wall injury. Gastrointestinal: Negative for abdominal pain or injury. Genitourinary: Negative for dysuria. Musculoskeletal: Negative for back injury, negative for arm or leg pain. Skin: Negative for laceration/abrasions. Neurological: Negative for head injury.   ____________________________________________   PHYSICAL EXAM:  VITAL SIGNS: Vitals:   06/06/17 1400 06/06/17 1430  BP: (!) 152/70 136/67  Pulse: (!) 47 (!) 51  Resp: 14 17    Temp:    SpO2: 98% 98%   Constitutional: Alert and oriented x 2. No acute distress. Does not appear intoxicated. HEENT Head: Normocephalic and atraumatic. Face: No facial bony tenderness. Stable midface Ears: No hemotympanum bilaterally. No Battle sign Eyes: No eye injury. PERRL. No raccoon eyes Nose: Nontender. No epistaxis. No rhinorrhea Mouth/Throat: Mucous membranes are moist. No oropharyngeal blood. No dental injury. Airway patent without stridor. Normal voice. Neck: no C-collar in place. No midline c-spine tenderness.  Cardiovascular: Normal rate, regular rhythm. Normal and symmetric distal pulses are present in all extremities. Pulmonary/Chest: Chest wall is stable and nontender to palpation/compression. Normal respiratory effort. Breath sounds are normal. No crepitus.  Abdominal: Soft, nontender, non distended. Musculoskeletal: Nontender with normal full range of motion in all extremities. No deformities. No thoracic or lumbar midline spinal tenderness. Pelvis is stable. Skin: Skin is warm, dry and intact. No abrasions or contutions.  Psychiatric: Speech and behavior are appropriate. Neurological: Normal speech and language. Moves all extremities to command. No gross focal neurologic deficits are appreciated.  Glascow Coma Score: 4 - Opens eyes on own 6 - Follows simple motor commands 5 - Alert and oriented GCS: 15  ____________________________________________   LABS (all labs ordered are listed, but only abnormal results are displayed)  Labs Reviewed  CBC WITH DIFFERENTIAL/PLATELET - Abnormal; Notable for the following:       Result Value   RBC 3.48 (*)    Hemoglobin 11.7 (*)    HCT 34.6 (*)    RDW 15.2 (*)    Platelets 69 (*)    Lymphs Abs 0.3 (*)    All other components within normal limits  COMPREHENSIVE METABOLIC PANEL - Abnormal; Notable for the following:    BUN 28 (*)    Calcium 8.7 (*)    ALT <5 (*)    All other components within normal limits   URINALYSIS, COMPLETE (UACMP) WITH MICROSCOPIC - Abnormal; Notable for the following:    Color, Urine YELLOW (*)    APPearance CLEAR (*)    All other components within normal limits  TROPONIN I   ____________________________________________  EKG  ED ECG REPORT I, Nita Sicklearolina Anuoluwapo Mefferd, the attending physician, personally viewed and interpreted this ECG.  Sinus bradycardia, rate of 54, normal QTC, normal axis, no ST elevations or depressions.  No significant changes when compared to prior from 12/2016 ____________________________________________  RADIOLOGY  Head CT: Chronic changes as described without acute abnormality. ____________________________________________   PROCEDURES  Procedure(s) performed: None Procedures Critical Care performed:  None ____________________________________________   INITIAL IMPRESSION / ASSESSMENT AND PLAN / ED COURSE  83 y.o. male with a history of CAD, hypertension, Parkinson's disease, hyperlipidemia, melanoma in situ,cataracts status post surgery who presents for evaluation of generalized weakness and multiple falls. Patient has had several falls for months now. Has been seen by his neurologist who thinks there is a component of Parkinson's but also arthritis, poor vision, and deconditioning (per note from August 2018). Patient is denying any pain at this time. However since he continues to have multiple falls and has a history of dementia I will scan his head being a acute intracranial injuries that could be making him more prone to fall. There is no other bruising or tenderness on exam. Labs will be done to rule out other possible etiologies which could be predisposing patient to fall such as UTI, dehydration, anemia.   Clinical Course as of Jun 06 1458  Fri Jun 06, 2017  1456 Labs, UA, and head CT with no acute findings. Patient's wife unable to care for the patient at home. SW and PT consult placed to determine if patient needs placement. Patient is  medically cleared. Care transferred to Dr. Darnelle CatalanMalinda.  [CV]    Clinical Course User Index [CV] Don PerkingVeronese, [place], MD     As part of my medical decision making, I reviewed the following data within the electronic MEDICAL RECORD NUMBER History obtained from family, Nursing notes reviewed and incorporated, Labs reviewed , EKG interpreted , Old EKG reviewed, Old chart reviewed, Patient signed out to Dr. Darnelle CatalanMalinda, Radiograph reviewed , A consult was requested and obtained from this/these consultant(s) Social work and physical therapy, Notes from prior ED visits and [place] Controlled Substance Database    Pertinent labs & imaging results that were available during my care of the patient were reviewed by me and considered in my medical decision making (see chart for details).  ____________________________________________   FINAL CLINICAL IMPRESSION(S) / ED DIAGNOSES  Final diagnoses:  Multiple falls  Generalized weakness      NEW MEDICATIONS STARTED DURING THIS VISIT:  New Prescriptions   No medications on file     Note:  This document was prepared using Dragon voice recognition software and may include unintentional dictation errors.    Nita Sickle, MD 06/06/17 [phone]

## 2017-06-06 NOTE — Clinical Social Work Note (Signed)
Clinical Social Work Assessment  Patient Details  Name: Perry [name] MRN: [id number] Date of Birth: September 18, 1928  Date of referral:  06/06/17               Reason for consult:  Facility Placement                Permission sought to share information with:  Family Supports, Customer service manager Permission granted to share information::  Yes, Verbal Permission Granted  Name::     Perry [name] ( wife HCPOA) [phone] or cell phone [phone]  Agency::     Relationship::     Contact Information:     Housing/Transportation Living arrangements for the past 2 months:  Single Family Home (Moving to an ALF in 1-2 months [facility]) Source of Information:  Spouse Patient Interpreter Needed:  None Criminal Activity/Legal Involvement Pertinent to Current Situation/Hospitalization:  No - Comment as needed Significant Relationships:  Adult Children, Other Family Members, Spouse Lives with:  Spouse Do you feel safe going back to the place where you live?  Yes Need for family participation in patient care:  Yes (Comment)  Care giving concerns:Wife reports she cant manage her husband when he falls   Social Worker assessment / plan:  LCSW met and introduced myself to patient and wife. Patient was asleep and would move a little but did not contribute to the discussion. His wife Perry [name] is the HCPOA and has been married to her for 70 years and has 2 grown children son and daughter and 5 grand kids. He is to have a diagnosis's of Parkinson's disease and brain hemoraging as well.Patient uses a walker and needs assistance with meal prep, shower, dressing and getting out of bed. This gentleman uses diapers in evening and for the most past he uses pads and goes on his own . This patients wife has an apartment on [address] [facility] and hopes when he is stronger he will be able to move in with her.  Employment status:  Disabled (Comment on whether or not currently receiving Disability),  Retired Insurance underwriter information:  Information systems manager ([employer]. Medicare and [place]) PT Recommendations:  Not assessed at this time Information / Referral to community resources:  [place]  Patient/Family's Response to care: They feel he needs strength building and PT to assist with patient with recent weakness and falls  Patient/Family's Understanding of and Emotional Response to Diagnosis, Current Treatment, and Prognosis: Good understanding of illness  Emotional Assessment Appearance:  Appears stated age Attitude/Demeanor/Rapport:  Unable to Assess, Other (Patient was asleep) Affect (typically observed):  Accepting, Calm Orientation:  Oriented to Self, Oriented to Place, Oriented to  Time Alcohol / Substance use:  Not Applicable Psych involvement (Current and /or in the community):  No (Comment)  Discharge Needs  Concerns to be addressed:  No discharge needs identified Readmission within the last 30 days:  No Current discharge risk:    Barriers to Discharge:  No Barriers Identified   Joana Reamer, LCSW 06/06/2017, 2:08 PM

## 2017-06-06 NOTE — ED Notes (Signed)
Physical therapy at bedside. Wife at bedside.

## 2017-06-06 NOTE — ED Notes (Signed)
Pt given cup of water, explained we are waiting PT consult at this time.

## 2017-06-06 NOTE — ED Notes (Signed)
Dr. Don PerkingVeronese notified SW of consult

## 2017-06-06 NOTE — ED Notes (Signed)
Pt changed one last time and pt and patient family verbalize understanding of follow up care and home care. Pt in NAD at this time.

## 2017-06-06 NOTE — Evaluation (Signed)
Physical Therapy Evaluation Patient Details Name: Perry [name] MRN: [id number] DOB: 08/19/1928 Today's Date: 06/06/2017   History of Present Illness  83 y.o. male with a history of CAD, hypertension, Parkinson's disease, hyperlipidemia, melanoma in situ,cataracts status post surgery who presents for evaluation of generalized weakness and multiple falls  Clinical Impression  Pt has been having increased falls and decreased stability safety over the last few weeks.  He was able to do some limited ambulation with PT exam today (~25 ft) but was slow, fatigued quickly (HR to to 130s) and is not at his baseline.  Pt and wife agree that he is not as safe at home as he could/should be and despite him walking to go home and have HHPT he seems to realize that he is not safe and would benefit much more from going to rehab for a while and working back to his baseline.  Pt with no overt LOBs t/o session, but needed considerable assist to get back into bed and generally was weak/functionally limited.    Follow Up Recommendations SNF    Equipment Recommendations       Recommendations for Other Services       Precautions / Restrictions Precautions Precautions: Fall Restrictions Weight Bearing Restrictions: No      Mobility  Bed Mobility Overal bed mobility: Needs Assistance Bed Mobility: Supine to Sit;Sit to Supine     Supine to sit: Min assist Sit to supine: Mod assist   General bed mobility comments: Pt showed good effort getting up to EOB, needed some extra time and light assist.  Pt unable to get himself back into bed w/o heavier assist for trunk and LEs  Transfers Overall transfer level: Modified independent Equipment used: Rolling walker (2 wheeled)             General transfer comment: Pt needed a lot of cuing and assist with set up, but once positioned he was able to rise w/o direct physical assist.  Pt reliant on the walker for balance, but ultimately was  safe.  Ambulation/Gait Ambulation/Gait assistance: Min assist;Min guard Ambulation Distance (Feet): 25 Feet Assistive device: Rolling walker (2 wheeled)       General Gait Details: Pt initially with slow, cautious steps.  Once he "warmed up" he showed less Parkinsonian gait and had increased cadence, but his HR quickly rose to the mid 130s and he had signficant fatigue with the minimal effort.   Stairs            Wheelchair Mobility    Modified Rankin (Stroke Patients Only)       Balance Overall balance assessment: Needs assistance Sitting-balance support: Bilateral upper extremity supported Sitting balance-Leahy Scale: Fair Sitting balance - Comments: Pt leaning backward, definite need of hands to stay upright   Standing balance support: Bilateral upper extremity supported Standing balance-Leahy Scale: Fair Standing balance comment: Pt needing RW to maintain balance, no overt LOBs with standing/ambulation                             Pertinent Vitals/Pain Pain Assessment: No/denies pain    Home Living Family/patient expects to be discharged to:: Skilled nursing facility                 Additional Comments: uses walker at home, has had multiple falls in the last 6 months    Prior Function Level of Independence: Independent with assistive device(s)  Comments: Pt only out of the house for MD appointments, has been getting weaker over the last few months     Hand Dominance        Extremity/Trunk Assessment   Upper Extremity Assessment Upper Extremity Assessment: Generalized weakness (unable to fully raise b/l shld grossly 3+/5 in available ROM)    Lower Extremity Assessment Lower Extremity Assessment: Generalized weakness (grossly 3+/5)       Communication   Communication: No difficulties  Cognition Arousal/Alertness: Awake/alert Behavior During Therapy: WFL for tasks assessed/performed Overall Cognitive Status: Within  Functional Limits for tasks assessed                                        General Comments      Exercises     Assessment/Plan    PT Assessment Patient needs continued PT services  PT Problem List Decreased strength;Decreased range of motion;Decreased activity tolerance;Decreased balance;Decreased mobility;Decreased coordination;Decreased safety awareness;Decreased knowledge of use of DME;Cardiopulmonary status limiting activity       PT Treatment Interventions DME instruction;Gait training;Stair training;Functional mobility training;Therapeutic activities;Therapeutic exercise;Balance training;Neuromuscular re-education;Patient/family education    PT Goals (Current goals can be found in the Care Plan section)  Acute Rehab PT Goals Patient Stated Goal: get stronger, pt would rather go right home but knows he's weak PT Goal Formulation: With patient Time For Goal Achievement: 06/20/17 Potential to Achieve Goals: Fair    Frequency Min 2X/week   Barriers to discharge        Co-evaluation               AM-PAC PT "6 Clicks" Daily Activity  Outcome Measure Difficulty turning over in bed (including adjusting bedclothes, sheets and blankets)?: A Little Difficulty moving from lying on back to sitting on the side of the bed? : A Lot Difficulty sitting down on and standing up from a chair with arms (e.g., wheelchair, bedside commode, etc,.)?: A Lot Help needed moving to and from a bed to chair (including a wheelchair)?: A Little Help needed walking in hospital room?: A Lot Help needed climbing 3-5 steps with a railing? : A Lot 6 Click Score: 14    End of Session Equipment Utilized During Treatment: Gait belt Activity Tolerance: Patient limited by fatigue Patient left: with chair alarm set;with call bell/phone within reach;with nursing/sitter in room   PT Visit Diagnosis: Muscle weakness (generalized) (M62.81);Difficulty in walking, not elsewhere classified  (R26.2)    Time: 1610-9604 PT Time Calculation (min) (ACUTE ONLY): 32 min   Charges:   PT Evaluation $PT Eval Low Complexity: 1 Low     PT G Codes:   PT G-Codes **NOT FOR INPATIENT CLASS** Functional Assessment Tool Used: AM-PAC 6 Clicks Basic Mobility Functional Limitation: Mobility: Walking and moving around Mobility: Walking and Moving Around Current Status (V4098): At least 40 percent but less than 60 percent impaired, limited or restricted Mobility: Walking and Moving Around Goal Status [phone]): At least 20 percent but less than 40 percent impaired, limited or restricted    Malachi Pro, DPT 06/06/2017, 4:11 PM

## 2017-06-06 NOTE — Progress Notes (Signed)
LCSW met with patient and his wife and completed Fl2 passr and assessment. The family would like [name] places at [facility] but understands other bed offers may need to be considered first.  The wifes plan is place her husband for STR and then he can join her at [facility] in [place] in 1 month or 2 once he is stronger. Awaiting PT consult.   [employer] LCSW [phone]

## 2017-06-06 NOTE — ED Notes (Signed)
Pt taken to lobby via wheelchair. Security has arranged for [employer] to help wife find the car and then also assist patient into car.

## 2017-06-06 NOTE — Discharge Instructions (Signed)
please follow-up with your doctor please return for any further problems.

## 2017-06-06 NOTE — ED Triage Notes (Signed)
Pt arrived via [facility] from home with reports of increased weakness over the past week and fall this morning. Pt has hx of parkinsons, overactive bladder and HTN. Pt is alert and oriented x 4. Pt denies any pain. Pt able to urinate in urinal on arrival

## 2017-06-06 NOTE — ED Provider Notes (Signed)
patient was waiting for placement however HEENT his wife decided to go home. They are going to assisted living and months. He will return for any further problems.   Arnaldo NatalMalinda, Paul F, MD 06/06/17 1700

## 2017-06-14 ENCOUNTER — Observation Stay (HOSPITAL_COMMUNITY)
Admission: EM | Admit: 2017-06-14 | Discharge: 2017-06-16 | Disposition: A | Discharge status: Home / Self Care | Payer: Medicare Other | Attending: Internal Medicine | Admitting: Internal Medicine

## 2017-06-14 ENCOUNTER — Emergency Department (HOSPITAL_COMMUNITY): Payer: Medicare Other

## 2017-06-14 ENCOUNTER — Encounter (HOSPITAL_COMMUNITY): Payer: Self-pay

## 2017-06-14 DIAGNOSIS — N179 Acute kidney failure, unspecified: Secondary | ICD-10-CM | POA: Diagnosis not present

## 2017-06-14 DIAGNOSIS — E785 Hyperlipidemia, unspecified: Secondary | ICD-10-CM | POA: Insufficient documentation

## 2017-06-14 DIAGNOSIS — Z79899 Other long term (current) drug therapy: Secondary | ICD-10-CM | POA: Insufficient documentation

## 2017-06-14 DIAGNOSIS — Z7951 Long term (current) use of inhaled steroids: Secondary | ICD-10-CM | POA: Insufficient documentation

## 2017-06-14 DIAGNOSIS — I252 Old myocardial infarction: Secondary | ICD-10-CM | POA: Insufficient documentation

## 2017-06-14 DIAGNOSIS — D696 Thrombocytopenia, unspecified: Secondary | ICD-10-CM | POA: Diagnosis present

## 2017-06-14 DIAGNOSIS — R32 Unspecified urinary incontinence: Secondary | ICD-10-CM | POA: Diagnosis not present

## 2017-06-14 DIAGNOSIS — F32A Depression, unspecified: Secondary | ICD-10-CM | POA: Diagnosis present

## 2017-06-14 DIAGNOSIS — Z888 Allergy status to other drugs, medicaments and biological substances status: Secondary | ICD-10-CM | POA: Insufficient documentation

## 2017-06-14 DIAGNOSIS — Z8041 Family history of malignant neoplasm of ovary: Secondary | ICD-10-CM | POA: Insufficient documentation

## 2017-06-14 DIAGNOSIS — G20 Parkinson's disease: Secondary | ICD-10-CM | POA: Diagnosis not present

## 2017-06-14 DIAGNOSIS — I2510 Atherosclerotic heart disease of native coronary artery without angina pectoris: Secondary | ICD-10-CM

## 2017-06-14 DIAGNOSIS — Z8249 Family history of ischemic heart disease and other diseases of the circulatory system: Secondary | ICD-10-CM | POA: Insufficient documentation

## 2017-06-14 DIAGNOSIS — F329 Major depressive disorder, single episode, unspecified: Secondary | ICD-10-CM | POA: Diagnosis not present

## 2017-06-14 DIAGNOSIS — I119 Hypertensive heart disease without heart failure: Secondary | ICD-10-CM | POA: Insufficient documentation

## 2017-06-14 DIAGNOSIS — G20C Parkinsonism, unspecified: Secondary | ICD-10-CM | POA: Diagnosis present

## 2017-06-14 DIAGNOSIS — R001 Bradycardia, unspecified: Secondary | ICD-10-CM | POA: Diagnosis not present

## 2017-06-14 DIAGNOSIS — R7989 Other specified abnormal findings of blood chemistry: Secondary | ICD-10-CM | POA: Diagnosis not present

## 2017-06-14 DIAGNOSIS — I10 Essential (primary) hypertension: Secondary | ICD-10-CM | POA: Diagnosis not present

## 2017-06-14 DIAGNOSIS — R4189 Other symptoms and signs involving cognitive functions and awareness: Secondary | ICD-10-CM | POA: Diagnosis present

## 2017-06-14 DIAGNOSIS — K5900 Constipation, unspecified: Secondary | ICD-10-CM | POA: Diagnosis not present

## 2017-06-14 DIAGNOSIS — D649 Anemia, unspecified: Secondary | ICD-10-CM | POA: Diagnosis not present

## 2017-06-14 DIAGNOSIS — Z87891 Personal history of nicotine dependence: Secondary | ICD-10-CM | POA: Insufficient documentation

## 2017-06-14 DIAGNOSIS — R404 Transient alteration of awareness: Principal | ICD-10-CM | POA: Insufficient documentation

## 2017-06-14 HISTORY — DX: Atherosclerotic heart disease of native coronary artery without angina pectoris: I25.10

## 2017-06-14 LAB — I-STAT TROPONIN, ED: Troponin i, poc: 0.01 ng/mL (ref 0.00–0.08)

## 2017-06-14 LAB — BASIC METABOLIC PANEL
Anion gap: 6 (ref 5–15)
BUN: 26 mg/dL — AB (ref 6–20)
CALCIUM: 8.6 mg/dL — AB (ref 8.9–10.3)
CO2: 28 mmol/L (ref 22–32)
CREATININE: 1.27 mg/dL — AB (ref 0.61–1.24)
Chloride: 101 mmol/L (ref 101–111)
GFR calc Af Amer: 57 mL/min — ABNORMAL LOW (ref >60)
GFR calc non Af Amer: 49 mL/min — ABNORMAL LOW (ref >60)
GLUCOSE: 118 mg/dL — AB (ref 65–99)
Potassium: 3.7 mmol/L (ref 3.5–5.1)
Sodium: 135 mmol/L (ref 135–145)

## 2017-06-14 LAB — CBC
HCT: 34.9 % — ABNORMAL LOW (ref 39.0–52.0)
Hemoglobin: 11.5 g/dL — ABNORMAL LOW (ref 13.0–17.0)
MCH: 32.8 pg (ref 26.0–34.0)
MCHC: 33 g/dL (ref 30.0–36.0)
MCV: 99.4 fL (ref 78.0–100.0)
PLATELETS: 80 10*3/uL — AB (ref 150–400)
RBC: 3.51 MIL/uL — ABNORMAL LOW (ref 4.22–5.81)
RDW: 15.2 % (ref 11.5–15.5)
WBC: 6.7 10*3/uL (ref 4.0–10.5)

## 2017-06-14 LAB — URINALYSIS, ROUTINE W REFLEX MICROSCOPIC
BILIRUBIN URINE: NEGATIVE
GLUCOSE, UA: NEGATIVE mg/dL
HGB URINE DIPSTICK: NEGATIVE
Ketones, ur: 5 mg/dL — AB
Leukocytes, UA: NEGATIVE
Nitrite: NEGATIVE
PROTEIN: NEGATIVE mg/dL
Specific Gravity, Urine: 1.021 (ref 1.005–1.030)
pH: 5 (ref 5.0–8.0)

## 2017-06-14 LAB — CBG MONITORING, ED: GLUCOSE-CAPILLARY: 142 mg/dL — AB (ref 65–99)

## 2017-06-14 MED ORDER — B COMPLEX-C PO TABS
1.0000 | ORAL_TABLET | Freq: Every day | ORAL | Status: DC
  Administered 2017-06-15 – 2017-06-16 (×2): 1 via ORAL
  Filled 2017-06-14 (×2): qty 1

## 2017-06-14 MED ORDER — FLUTICASONE PROPIONATE 50 MCG/ACT NA SUSP
2.0000 | Freq: Every day | NASAL | Status: DC
  Filled 2017-06-14 (×2): qty 16

## 2017-06-14 MED ORDER — CYANOCOBALAMIN 500 MCG PO TABS
500.0000 ug | ORAL_TABLET | Freq: Every day | ORAL | Status: DC
  Administered 2017-06-15 – 2017-06-16 (×2): 500 ug via ORAL
  Filled 2017-06-14 (×2): qty 1

## 2017-06-14 MED ORDER — MAGNESIUM OXIDE 400 (241.3 MG) MG PO TABS
200.0000 mg | ORAL_TABLET | Freq: Two times a day (BID) | ORAL | Status: DC
  Administered 2017-06-15 – 2017-06-16 (×4): 200 mg via ORAL
  Filled 2017-06-14 (×4): qty 1

## 2017-06-14 MED ORDER — SENNOSIDES-DOCUSATE SODIUM 8.6-50 MG PO TABS: 1.0000 | ORAL_TABLET | Freq: Every evening | ORAL | Status: DC | PRN

## 2017-06-14 MED ORDER — DOXAZOSIN MESYLATE 8 MG PO TABS
8.0000 mg | ORAL_TABLET | Freq: Two times a day (BID) | ORAL | Status: DC
  Administered 2017-06-15 – 2017-06-16 (×4): 8 mg via ORAL
  Filled 2017-06-14: qty 1
  Filled 2017-06-14: qty 4
  Filled 2017-06-14: qty 1
  Filled 2017-06-14 (×2): qty 4
  Filled 2017-06-14 (×2): qty 1
  Filled 2017-06-14: qty 4
  Filled 2017-06-14: qty 1

## 2017-06-14 MED ORDER — LORAZEPAM 2 MG/ML IJ SOLN
0.5000 mg | Freq: Once | INTRAMUSCULAR | Status: AC
  Administered 2017-06-15: 0.5 mg via INTRAVENOUS
  Filled 2017-06-14: qty 1

## 2017-06-14 MED ORDER — ENOXAPARIN SODIUM 40 MG/0.4ML ~~LOC~~ SOLN: 40.0000 mg | SUBCUTANEOUS | Status: DC

## 2017-06-14 MED ORDER — SODIUM CHLORIDE 0.9 % IV SOLN
INTRAVENOUS | Status: DC
Start: 2017-06-14 — End: 2017-06-16
  Administered 2017-06-15 – 2017-06-16 (×3): via INTRAVENOUS

## 2017-06-14 MED ORDER — ONDANSETRON HCL 4 MG/2ML IJ SOLN: 4.0000 mg | Freq: Four times a day (QID) | INTRAMUSCULAR | Status: DC | PRN

## 2017-06-14 MED ORDER — ARGININE 500 MG PO TABS: 1000.0000 mg | ORAL_TABLET | Freq: Two times a day (BID) | ORAL | Status: DC

## 2017-06-14 MED ORDER — HYDROCODONE-ACETAMINOPHEN 5-325 MG PO TABS
1.0000 | ORAL_TABLET | ORAL | Status: DC | PRN
  Administered 2017-06-16: 1 via ORAL
  Filled 2017-06-14: qty 1

## 2017-06-14 MED ORDER — ONDANSETRON HCL 4 MG PO TABS: 4.0000 mg | ORAL_TABLET | Freq: Four times a day (QID) | ORAL | Status: DC | PRN

## 2017-06-14 MED ORDER — SODIUM CHLORIDE 0.9 % IV BOLUS (SEPSIS)
500.0000 mL | Freq: Once | INTRAVENOUS | Status: AC
Start: 2017-06-14 — End: 2017-06-14
  Administered 2017-06-14: 500 mL via INTRAVENOUS

## 2017-06-14 MED ORDER — HYDRALAZINE HCL 20 MG/ML IJ SOLN: 5.0000 mg | INTRAMUSCULAR | Status: DC | PRN

## 2017-06-14 MED ORDER — ACETAMINOPHEN 650 MG RE SUPP: 650.0000 mg | Freq: Four times a day (QID) | RECTAL | Status: DC | PRN

## 2017-06-14 MED ORDER — MEGESTROL ACETATE 20 MG PO TABS
20.0000 mg | ORAL_TABLET | Freq: Every day | ORAL | Status: DC
  Administered 2017-06-15 – 2017-06-16 (×2): 20 mg via ORAL
  Filled 2017-06-14 (×2): qty 1

## 2017-06-14 MED ORDER — SODIUM CHLORIDE 0.9% FLUSH
3.0000 mL | Freq: Two times a day (BID) | INTRAVENOUS | Status: DC
  Administered 2017-06-15 – 2017-06-16 (×2): 3 mL via INTRAVENOUS

## 2017-06-14 MED ORDER — ACETAMINOPHEN 325 MG PO TABS
650.0000 mg | ORAL_TABLET | Freq: Four times a day (QID) | ORAL | Status: DC | PRN
Start: 2017-06-14 — End: 2017-06-16

## 2017-06-14 MED ORDER — CALCIUM CARBONATE-VITAMIN D 500-200 MG-UNIT PO TABS
1.0000 | ORAL_TABLET | Freq: Every day | ORAL | Status: DC
  Administered 2017-06-15 – 2017-06-16 (×2): 1 via ORAL
  Filled 2017-06-14 (×2): qty 1

## 2017-06-14 MED ORDER — SODIUM CHLORIDE 0.9 % IV SOLN
Freq: Once | INTRAVENOUS | Status: AC
  Administered 2017-06-14: 21:00:00 via INTRAVENOUS

## 2017-06-14 MED ORDER — POLYETHYLENE GLYCOL 3350 17 G PO PACK
17.0000 g | PACK | Freq: Every day | ORAL | Status: DC
  Administered 2017-06-15 – 2017-06-16 (×2): 17 g via ORAL
  Filled 2017-06-14 (×2): qty 1

## 2017-06-14 MED ORDER — TIMOLOL MALEATE 0.5 % OP SOLN
1.0000 [drp] | Freq: Two times a day (BID) | OPHTHALMIC | Status: DC
  Administered 2017-06-15 – 2017-06-16 (×3): 1 [drp] via OPHTHALMIC
  Filled 2017-06-14 (×2): qty 5

## 2017-06-14 MED ORDER — CARBIDOPA-LEVODOPA 25-100 MG PO TABS
2.0000 | ORAL_TABLET | Freq: Three times a day (TID) | ORAL | Status: DC
  Administered 2017-06-15 – 2017-06-16 (×6): 2 via ORAL
  Filled 2017-06-14 (×7): qty 2

## 2017-06-14 MED ORDER — SERTRALINE HCL 50 MG PO TABS
150.0000 mg | ORAL_TABLET | Freq: Every day | ORAL | Status: DC
  Administered 2017-06-15: 150 mg via ORAL
  Filled 2017-06-14 (×2): qty 1

## 2017-06-14 MED ORDER — DONEPEZIL HCL 10 MG PO TABS
10.0000 mg | ORAL_TABLET | Freq: Every day | ORAL | Status: DC
  Administered 2017-06-15 (×2): 10 mg via ORAL
  Filled 2017-06-14 (×2): qty 1

## 2017-06-14 MED ORDER — MIRABEGRON ER 50 MG PO TB24
50.0000 mg | ORAL_TABLET | Freq: Two times a day (BID) | ORAL | Status: DC
  Administered 2017-06-15 – 2017-06-16 (×3): 50 mg via ORAL
  Filled 2017-06-14 (×4): qty 1

## 2017-06-14 MED ORDER — VITAMIN D 1000 UNITS PO TABS
2000.0000 [IU] | ORAL_TABLET | Freq: Every day | ORAL | Status: DC
  Administered 2017-06-15 – 2017-06-16 (×2): 2000 [IU] via ORAL
  Filled 2017-06-14 (×2): qty 2

## 2017-06-14 MED ORDER — CO Q-10 100 MG PO CAPS: 100.0000 mg | ORAL_CAPSULE | Freq: Every day | ORAL | Status: DC

## 2017-06-14 MED ORDER — OMEGA-3-ACID ETHYL ESTERS 1 G PO CAPS
1.0000 g | ORAL_CAPSULE | Freq: Every day | ORAL | Status: DC
  Administered 2017-06-15 – 2017-06-16 (×2): 1 g via ORAL
  Filled 2017-06-14 (×2): qty 1

## 2017-06-14 NOTE — ED Provider Notes (Signed)
[facility]   CSN: [id number] Arrival date & time: 06/14/17  1902     History   Chief Complaint Chief Complaint  Patient presents with  . Loss of Consciousness    HPI Perry [name] is a 83 y.o. male.  The history is provided by the patient and medical records. No language interpreter was used.  Loss of Consciousness   Associated symptoms include diaphoresis. Pertinent negatives include abdominal pain, chest pain, fever, headaches and weakness.   Perry [name] is a 83 y.o. male  with a PMH of Parkinson's, HTN, HLD, prior heart attack, bradycardia who presents to the Emergency Department for evaluation of altered level of consciousness earlier today. Patient states this was a normal day for him. He is unable to explain what happened to lead to ER visit, therefore the majority of the history is obtained from wife who was present at home. Wife states that he was sitting down at the table and around 4:30, he suddenly quit responding. He was not moving or shaking. He would not respond to wife verbally. This lasted about 10 minutes. EMS was called. Per wife and daughter at bedside, there was no incontinence during the episode, however per nursing staff, EMS did report an episode of urinary incontinence en route. Denies recent illness or new medications. Denies chest pain, trouble breathing, abdominal pain, back pain or headache.   Past Medical History:  Diagnosis Date  . Arthritis   . Heart attack (HCC)   . Hyperlipidemia   . Hypertension   . Parkinson's disease (HCC)    lower extremity   . S/P TURP   . Urinary incontinence     Patient Active Problem List   Diagnosis Date Noted  . CAD (coronary artery disease) 06/14/2017  . AKI (acute kidney injury) (HCC) 06/14/2017  . Unresponsiveness 06/14/2017  . Thrombocytopenia (HCC) 06/14/2017  . Normocytic anemia 06/14/2017  . Parkinson's disease (HCC)   . Hypertension   .  Hyperlipidemia   . Bradycardia 05/17/2016  . Benign hypertensive heart disease without heart failure 12/23/2012  . Dyspnea on exertion 08/25/2012  . Pedal edema 08/25/2012  . Heart murmur 08/25/2012  . Right inguinal hernia 02/28/2011    Past Surgical History:  Procedure Laterality Date  . CHOLECYSTECTOMY    . HERNIA REPAIR    . HIP FUSION     lt  . KNEE SURGERY     rt knee  . PROSTATE SURGERY    . TONSILLECTOMY    . WRIST SURGERY         Home Medications    Prior to Admission medications   Medication Sig Start Date End Date Taking? Authorizing Provider  arginine 500 MG tablet Take 1,000 mg by mouth 2 (two) times daily.   Yes [provider]  B Complex Vitamins (B-COMPLEX/B-12 PO) Take 1 capsule by mouth daily.    Yes [provider]  benazepril (LOTENSIN) 20 MG tablet Take 20 mg by mouth 2 (two) times daily.     Yes [provider]  calcium-vitamin D (OSCAL WITH D) 500-200 MG-UNIT per tablet Take 1 tablet by mouth daily.     Yes [provider]  carbidopa-levodopa (SINEMET IR) 25-100 MG tablet Take 2 tablets by mouth 3 (three) times daily.  01/19/16 06/14/21 Yes [provider]  carvedilol (COREG) 12.5 MG tablet Take 12.5 mg by mouth 2 (two) times daily with a meal.   Yes [provider]  Cholecalciferol (  VITAMIN D) 2000 UNITS CAPS Take 2,000 Units by mouth daily.    Yes [provider]  Coenzyme Q10 (CO Q-10) 100 MG CAPS Take 100 mg by mouth daily.    Yes [provider]  cyanocobalamin (V-R VITAMIN B-12) 500 MCG tablet Take 500 mcg by mouth daily.    Yes [provider]  donepezil (ARICEPT) 10 MG tablet Take 10 mg by mouth at bedtime. 12/23/16 06/14/21 Yes [provider]  doxazosin (CARDURA) 8 MG tablet Take 1 tablet (8 mg total) by mouth 2 (two) times daily. 12/31/16  Yes Gollan, Tollie Pizza, MD  fish oil-omega-3 fatty acids 1000 MG capsule Take 2 g by mouth daily.     Yes [provider]  fluticasone (FLONASE) 50 MCG/ACT nasal spray Place 2 sprays into both nostrils daily.   Yes [provider]  HYDROcodone-acetaminophen (NORCO/VICODIN) 5-325 MG tablet Take 1 tablet by mouth every 4 (four) hours as needed for moderate pain. 11/29/15  Yes Cuthriell, Delorise Royals, PA-C  Magnesium 200 MG TABS Take 200 mg by mouth 2 (two) times daily.    Yes [provider]  megestrol (MEGACE) 20 MG tablet Take 20 mg by mouth daily.   Yes [provider]  mirabegron ER (MYRBETRIQ) 50 MG TB24 Take 100 mg by mouth at bedtime.    Yes [provider]  potassium chloride SA (K-DUR,KLOR-CON) 20 MEQ tablet Take 20 mEq by mouth 2 (two) times daily.   Yes [provider]  sertraline (ZOLOFT) 50 MG tablet Take 150 mg by mouth at bedtime.  01/19/16 06/14/21 Yes [provider]  tadalafil (CIALIS) 20 MG tablet Take 20 mg by mouth daily as needed. (erectile dysfunction) 01/02/15  Yes [provider]  timolol (TIMOPTIC) 0.5 % ophthalmic solution Place 1 drop into the left eye 2 (two) times daily.    Yes [provider]  cephALEXin (KEFLEX) 500 MG capsule Take 1 capsule (500 mg total) by mouth 3 (three) times daily. Patient not taking: Reported on 06/06/2017 08/07/16   Phineas Semen, MD    Family History Family History  Problem Relation Age of Onset  . Healthy Mother   . Hypertension Father   . Ovarian cancer Sister   . Appendicitis Brother   . Hypertension Sister   . Heart disease Sister     Social History Social History  Substance Use Topics  . Smoking status: Former Smoker    Types: Cigars  . Smokeless tobacco: Never Used  . Alcohol use No     Allergies   Tetanus toxoids; Atorvastatin; and Rosuvastatin   Review of Systems Review of Systems  Constitutional: Positive for diaphoresis. Negative for chills and fever.  Respiratory: Negative for shortness of breath.   Cardiovascular: Positive for syncope. Negative  for chest pain.  Gastrointestinal: Negative for abdominal pain.  Neurological: Negative for weakness, numbness and headaches.       + altered LOC ? Syncope vs. Seizure.   All other systems reviewed and are negative.    Physical Exam Updated Vital Signs BP (!) 141/76   Pulse (!) 57   Temp 98.6 F (37 C)   Resp 15   Ht 5\' 9"  (1.753 m)   Wt 68 kg (150 lb)   SpO2 99%   BMI 22.15 kg/m   Physical Exam  Constitutional: He is oriented to person, place, and time. He appears well-developed and well-nourished. No distress.  HENT:  Head: Normocephalic and atraumatic.  No evidence of tongue biting.  Cardiovascular: Normal rate, regular rhythm and normal heart sounds.   No murmur heard. Pulmonary/Chest: Effort normal and breath sounds normal. No respiratory distress.  Abdominal: Soft. He exhibits no distension. There is no tenderness.  Musculoskeletal: He exhibits no edema.  Neurological: He is alert and oriented to person, place, and time.  Speech clear and goal oriented. Pupils not equal, however patient endorses prior cataract surgery. Otherwise, CN 2-12 grossly intact. No drift. Strength and sensation intact.  Skin: Skin is warm and dry.  Nursing note and vitals reviewed.    ED Treatments / Results  Labs (all labs ordered are listed, but only abnormal results are displayed) Labs Reviewed  BASIC METABOLIC PANEL - Abnormal; Notable for the following:       Result Value   Glucose, Bld 118 (*)    BUN 26 (*)    Creatinine, Ser 1.27 (*)    Calcium 8.6 (*)    GFR calc non Af Amer 49 (*)    GFR calc Af Amer 57 (*)    All other components within normal limits  CBC - Abnormal; Notable for the following:    RBC 3.51 (*)    Hemoglobin 11.5 (*)    HCT 34.9 (*)    Platelets 80 (*)    All other components within normal limits  URINALYSIS, ROUTINE W REFLEX MICROSCOPIC - Abnormal; Notable for the following:    APPearance HAZY (*)    Ketones, ur 5 (*)    All other components within  normal limits  CBG MONITORING, ED - Abnormal; Notable for the following:    Glucose-Capillary 142 (*)    All other components within normal limits  I-STAT TROPONIN, ED    EKG  EKG Interpretation  Date/Time:  Saturday June 14 2017 19:14:18 EDT Ventricular Rate:  57 PR Interval:    QRS Duration: 121 QT Interval:  443 QTC Calculation: 432 R Axis:   36 Text Interpretation:  Sinus rhythm Nonspecific intraventricular conduction delay Probable lateral infarct, old No significant change since last tracing Confirmed by Drema Pry [phone]) on 06/14/2017 7:57:13 PM       Radiology Ct Head Wo Contrast  Result Date: 06/14/2017 CLINICAL DATA:  Altered mental status.  Dizziness. EXAM: CT HEAD WITHOUT CONTRAST TECHNIQUE: Contiguous axial images were obtained from the base of the skull through the vertex without intravenous contrast. COMPARISON:  Head CT 1 week prior 06/06/2017 FINDINGS: Brain: Stable degree of atrophy and chronic small vessel ischemia from prior exam. No intracranial hemorrhage, mass effect, or midline shift. No hydrocephalus. The basilar cisterns are patent. No evidence of territorial infarct or acute ischemia. No extra-axial or intracranial fluid collection. Vascular: Atherosclerosis of skullbase vasculature without hyperdense vessel or abnormal calcification. Skull: No fracture or focal lesion. Sinuses/Orbits: Paranasal sinuses and mastoid air cells are clear. The visualized orbits are unremarkable. Bilateral cataract resection. Other: None. IMPRESSION: No acute intracranial abnormality. Stable atrophy and chronic small vessel ischemia. Electronically Signed   By: Rubye Oaks M.D.   On: 06/14/2017 21:20    Procedures Procedures (including critical care time)  Medications Ordered in ED Medications  sodium chloride 0.9 % bolus 500 mL (0 mLs Intravenous Stopped 06/14/17 2253)    Followed by  0.9 %  sodium chloride infusion ( Intravenous New Bag/Given 06/14/17 2037)      Initial Impression / Assessment and Plan / ED Course  I have reviewed the triage vital signs and the nursing notes.  Pertinent labs & imaging results that were available during  my care of the patient were reviewed by me and considered in my medical decision making (see chart for details).    BENUEL LY is a 83 y.o. male who presents to ED for altered level of consciousness which occurred around 4:30 this afternoon. Upon ED arrival, patient awake and alert. Pupils slightly asymmetric but he does have hx of prior cataract surgery. Otherwise, benign neuro examination. CT head negative. EKG unchanged from previous. Labs reviewed: AKI with creatinine of 1.27. UA without signs of infection. Ddx include seizure vs. TIA vs. Syncope. Consulted hospitalist who will admit.   Patient seen by and discussed with Dr. Eudelia Bunch who agrees with treatment plan.    Final Clinical Impressions(s) / ED Diagnoses   Final diagnoses:  Parkinson's disease (HCC)  Altered level of consciousness  Essential hypertension    New Prescriptions New Prescriptions   No medications on file     Jemal Miskell, Chase Picket, PA-C 06/14/17 2330    Nira Conn, MD 06/15/17 [phone]

## 2017-06-14 NOTE — ED Triage Notes (Signed)
Pt comes via [facility] from home. LNW at 4pm, around 430 about 10 minute episode where he would not respond, with incontinence, no seizure like activity. No neuro deficits, negative for orthostatics with EMS

## 2017-06-14 NOTE — H&P (Signed)
History and Physical    Perry [name] BJY:[id number] DOB: 30-Dec-1928 DOA: 06/14/2017  Referring MD/NP/PA:   PCP: Lynnea Ferrier, MD   Patient coming from:  The patient is coming from home.  At baseline, pt is independent for most of ADL.   Chief Complaint: Unresponsiveness,  HPI: Perry [name] is a 83 y.o. male with medical history significant of hypertension, hyperlipidemia, depression, CAD, Parkinson's disease, bradycardia, s/p of TURP, thrombocytopenia, who presents with and responsiveness.  Per his daughter and wife, he was sitting down at the table and around 4:30, he suddenly quit responding. He stopped talking and moving. No seizure activity noted. He was not moving, Not shaking.This lasted about 10 minutes. Pt had an episode of urinary incontinence en route per EMS. No unilateral weakness, numbness or tingling to extremities. No vision change or hearing loss. Patient states that he had one episode of mild left lower chest pain, which has resolved completely. Currently patient does not have chest pain, shortness breath, cough, fever or chills. Denies nausea, vomiting, diarrhea or abdominal pain. No symptoms of UTI  ED Course: pt was found to have WBC 6.7, negative troponin, platelet 80 which was 69 on 06/06/17, positive D-dimer 1.63, negative urinalysis, acute renal injury with creatinine 1.27, temperature normal, bradycardia, no tachypnea, oxygen saturation 99% on room air, CT head is negative for acute intracranial abnormalities. Patient is placed on telemetry bed for observation.  Review of Systems:   General: no fevers, chills, no body weight gain, has fatigue HEENT: no blurry vision, hearing changes or sore throat Respiratory: no dyspnea, coughing, wheezing CV: had chest pain, no palpitations GI: no nausea, vomiting, abdominal pain, diarrhea, constipation GU: no dysuria, burning on urination, increased urinary frequency, hematuria  Ext: no leg edema Neuro: no unilateral  weakness, numbness, or tingling, no vision change or hearing loss. Had and responsiveness. Skin: no rash, no skin tear. MSK: No muscle spasm, no deformity, no limitation of range of movement in spin Heme: No easy bruising.  Travel history: No recent long distant travel.  Allergy:  Allergies  Allergen Reactions  . Tetanus Toxoids Other (See Comments)    REACTION: " 50 CENT PIECE SIZE WELTS AND CLOSES MY EYES"  . Atorvastatin Other (See Comments)    REACTION: " MUSCLE WEAKNESS"  . Rosuvastatin Other (See Comments)    REACTION: " MUSCLE WEAKNESS"    Past Medical History:  Diagnosis Date  . Arthritis   . Heart attack (HCC)   . Hyperlipidemia   . Hypertension   . Parkinson's disease (HCC)    lower extremity   . S/P TURP   . Urinary incontinence     Past Surgical History:  Procedure Laterality Date  . CHOLECYSTECTOMY    . HERNIA REPAIR    . HIP FUSION     lt  . KNEE SURGERY     rt knee  . PROSTATE SURGERY    . TONSILLECTOMY    . WRIST SURGERY      Social History:  reports that he has quit smoking. His smoking use included cigars. he has never used smokeless tobacco. He reports that he does not drink alcohol. His drug history is not on file.  Family History:  Family History  Problem Relation Age of Onset  . Healthy Mother   . Hypertension Father   . Ovarian cancer Sister   . Appendicitis Brother   . Hypertension Sister   . Heart disease Sister      Prior to  Admission medications   Medication Sig Start Date End Date Taking? Authorizing Provider  arginine 500 MG tablet Take 1,000 mg by mouth 2 (two) times daily.   Yes [provider]  B Complex Vitamins (B-COMPLEX/B-12 PO) Take 1 capsule by mouth daily.    Yes [provider]  benazepril (LOTENSIN) 20 MG tablet Take 20 mg by mouth 2 (two) times daily.     Yes [provider]  calcium-vitamin D (OSCAL WITH D) 500-200 MG-UNIT per tablet Take 1 tablet by mouth daily.     Yes [provider]  carbidopa-levodopa (SINEMET IR) 25-100 MG tablet Take 2 tablets by mouth 3 (three) times daily.  01/19/16 06/14/21 Yes [provider]  carvedilol (COREG) 12.5 MG tablet Take 12.5 mg by mouth 2 (two) times daily with a meal.   Yes [provider]  Cholecalciferol (VITAMIN D) 2000 UNITS CAPS Take 2,000 Units by mouth daily.    Yes [provider]  Coenzyme Q10 (CO Q-10) 100 MG CAPS Take 100 mg by mouth daily.    Yes [provider]  cyanocobalamin (V-R VITAMIN B-12) 500 MCG tablet Take 500 mcg by mouth daily.    Yes [provider]  donepezil (ARICEPT) 10 MG tablet Take 10 mg by mouth at bedtime. 12/23/16 06/14/21 Yes [provider]  doxazosin (CARDURA) 8 MG tablet Take 1 tablet (8 mg total) by mouth 2 (two) times daily. 12/31/16  Yes Gollan, Tollie Pizza, MD  fish oil-omega-3 fatty acids 1000 MG capsule Take 2 g by mouth daily.     Yes [provider]  fluticasone (FLONASE) 50 MCG/ACT nasal spray Place 2 sprays into both nostrils daily.   Yes [provider]  HYDROcodone-acetaminophen (NORCO/VICODIN) 5-325 MG tablet Take 1 tablet by mouth every 4 (four) hours as needed for moderate pain. 11/29/15  Yes Cuthriell, Delorise Royals, PA-C  Magnesium 200 MG TABS Take 200 mg by mouth 2 (two) times daily.    Yes [provider]  megestrol (MEGACE) 20 MG tablet Take 20 mg by mouth daily.   Yes [provider]  mirabegron ER (MYRBETRIQ) 50 MG TB24 Take 100 mg by mouth at bedtime.    Yes [provider]  potassium chloride SA (K-DUR,KLOR-CON) 20 MEQ tablet Take 20 mEq by mouth 2 (two) times daily.   Yes [provider]  sertraline (ZOLOFT) 50 MG tablet Take 150 mg by mouth at bedtime.  01/19/16 06/14/21 Yes [provider]  tadalafil (CIALIS) 20 MG tablet Take 20 mg by mouth daily as needed. (erectile dysfunction) 01/02/15  Yes [provider]  timolol (TIMOPTIC) 0.5 % ophthalmic solution  Place 1 drop into the left eye 2 (two) times daily.    Yes [provider]  cephALEXin (KEFLEX) 500 MG capsule Take 1 capsule (500 mg total) by mouth 3 (three) times daily. Patient not taking: Reported on 06/06/2017 08/07/16   Phineas Semen, MD    Physical Exam: Vitals:   06/14/17 2100 06/14/17 2130 06/14/17 2200 06/14/17 2215  BP: (!) 146/68 (!) 152/69 139/66 (!) 141/76  Pulse: (!) 57 (!) 57 (!) 56 (!) 57  Resp: 16 19 12 15   Temp:      TempSrc:      SpO2: 99% 100% 100% 99%  Weight:      Height:       General: Not in acute distress HEENT:       Eyes: PERRL, EOMI, no scleral icterus.  ENT: No discharge from the ears and nose, no pharynx injection, no tonsillar enlargement.        Neck: No JVD, no bruit, no mass felt. Heme: No neck lymph node enlargement. Cardiac: S1/S2, RRR, bradycardia, 1/6 systolic murmurs, No gallops or rubs. Respiratory:  No rales, wheezing, rhonchi or rubs. GI: Soft, nondistended, nontender, no rebound pain, no organomegaly, BS present. GU: No hematuria Ext: No pitting leg edema bilaterally. 2+DP/PT pulse bilaterally. Musculoskeletal: No joint deformities, No joint redness or warmth, no limitation of ROM in spin. Skin: No rashes.  Neuro: Alert, oriented X3, cranial nerves II-XII grossly intact, moves all extremities normally. Psych: Patient is not psychotic, no suicidal or hemocidal ideation.  Labs on Admission: I have personally reviewed following labs and imaging studies  CBC: Recent Labs  Lab 06/14/17 1936  WBC 6.7  HGB 11.5*  HCT 34.9*  MCV 99.4  PLT 80*   Basic Metabolic Panel: Recent Labs  Lab 06/14/17 1936  NA 135  K 3.7  CL 101  CO2 28  GLUCOSE 118*  BUN 26*  CREATININE 1.27*  CALCIUM 8.6*   GFR: Estimated Creatinine Clearance: 40.2 mL/min (A) (by C-G formula based on SCr of 1.27 mg/dL (H)). Liver Function Tests: No results for input(s): AST, ALT, ALKPHOS, BILITOT, PROT, ALBUMIN in the last 168 hours. No  results for input(s): LIPASE, AMYLASE in the last 168 hours. No results for input(s): AMMONIA in the last 168 hours. Coagulation Profile: No results for input(s): INR, PROTIME in the last 168 hours. Cardiac Enzymes: Recent Labs  Lab 06/14/17 2358  TROPONINI <0.03   BNP (last 3 results) No results for input(s): PROBNP in the last 8760 hours. HbA1C: No results for input(s): HGBA1C in the last 72 hours. CBG: Recent Labs  Lab 06/14/17 1925  GLUCAP 142*   Lipid Profile: No results for input(s): CHOL, HDL, LDLCALC, TRIG, CHOLHDL, LDLDIRECT in the last 72 hours. Thyroid Function Tests: No results for input(s): TSH, T4TOTAL, FREET4, T3FREE, THYROIDAB in the last 72 hours. Anemia Panel: No results for input(s): VITAMINB12, FOLATE, FERRITIN, TIBC, IRON, RETICCTPCT in the last 72 hours. Urine analysis:    Component Value Date/Time   COLORURINE YELLOW 06/14/2017 2044   APPEARANCEUR HAZY (A) 06/14/2017 2044   LABSPEC 1.021 06/14/2017 2044   PHURINE 5.0 06/14/2017 2044   GLUCOSEU NEGATIVE 06/14/2017 2044   HGBUR NEGATIVE 06/14/2017 2044   BILIRUBINUR NEGATIVE 06/14/2017 2044   KETONESUR 5 (A) 06/14/2017 2044   PROTEINUR NEGATIVE 06/14/2017 2044   UROBILINOGEN 0.2 02/26/2011 1507   NITRITE NEGATIVE 06/14/2017 2044   LEUKOCYTESUR NEGATIVE 06/14/2017 2044   Sepsis Labs: @LABRCNTIP (procalcitonin:4,lacticidven:4) )No results found for this or any previous visit (from the past 240 hour(s)).   Radiological Exams on Admission: Ct Head Wo Contrast  Result Date: 06/14/2017 CLINICAL DATA:  Altered mental status.  Dizziness. EXAM: CT HEAD WITHOUT CONTRAST TECHNIQUE: Contiguous axial images were obtained from the base of the skull through the vertex without intravenous contrast. COMPARISON:  Head CT 1 week prior 06/06/2017 FINDINGS: Brain: Stable degree of atrophy and chronic small vessel ischemia from prior exam. No intracranial hemorrhage, mass effect, or midline shift. No hydrocephalus. The  basilar cisterns are patent. No evidence of territorial infarct or acute ischemia. No extra-axial or intracranial fluid collection. Vascular: Atherosclerosis of skullbase vasculature without hyperdense vessel or abnormal calcification. Skull: No fracture or focal lesion. Sinuses/Orbits: Paranasal sinuses and mastoid air cells are clear. The visualized orbits are unremarkable. Bilateral cataract resection. Other: None. IMPRESSION: No acute  intracranial abnormality. Stable atrophy and chronic small vessel ischemia. Electronically Signed   By: Rubye Oaks M.D.   On: 06/14/2017 21:20     EKG: Independently reviewed.  Sinus rhythm, bradycardia, QTC 432, QRS 121, poor R-wave progression   Assessment/Plan Principal Problem:   Unresponsiveness Active Problems:   Bradycardia   Parkinson's disease (HCC)   Hypertension   CAD (coronary artery disease)   AKI (acute kidney injury) (HCC)   Thrombocytopenia (HCC)   Normocytic anemia   Depression   Unresponsiveness/possible syncope: Etiology is not clear. Differential diagnoses include TIA/stroke, seizure, cardiac arrhythmia, severe bradycardia given chronic bradycardia, PE given episode of left lower chest pain. CT head is negative for acute intracranial abnormalities. D-dimer is +1.63   -Will place on tele bed for obs -stat CTA of chest to r/o PE -MIR of brain to r/o stroke -EED -2d eho and trop x 3 -check orthostative status -IVF: 500 cc of NS, then 100 cc/h -PT/OT  CAD: had one episode of chest pain, -f/u trop x 3 and 2-D echo -Continue Coreg  AKI: cre 1.27 Likely due to prerenal secondary to dehydration and continuation of ARB - IVF as above - Check - Follow up renal function by BMP - Hold lotensin  Hypertension: Blood pressure 141/76 -Hold Lotensin due to acute renal injury -Continue Coreg -IV hydralazine when necessary  Thrombocytopenia: This seems to be a chronic issue. Platelet was 62 on 08/07/16, 69 on 06/06/17 and is 80  today. No bleeding tendency. Mental status normal currently. -check LDH and peripheral smear -Follow-up by CBC  Bradycardia: Chronic issue. Heart rate 50s -Telemetry monitor  Parkinson's disease: Continue Sinemet  Constipation: -MiraLAX and senokote   DVT ppx: SCD Code Status: Full code Family Communication: Yes, daughter and the wife at the bedside  Disposition Plan:  Anticipate discharge back to previous home environment Consults called:  none Admission status: Obs / tele     Date of Service 06/15/2017    Lorretta Harp [facility] Pager [phone]  If 7PM-7AM, please contact night-coverage [email or web address] Password [facility] 06/15/2017, 4:33 AM

## 2017-06-15 ENCOUNTER — Observation Stay (HOSPITAL_COMMUNITY): Payer: Medicare Other

## 2017-06-15 DIAGNOSIS — I10 Essential (primary) hypertension: Secondary | ICD-10-CM | POA: Diagnosis not present

## 2017-06-15 DIAGNOSIS — R4189 Other symptoms and signs involving cognitive functions and awareness: Secondary | ICD-10-CM

## 2017-06-15 DIAGNOSIS — N179 Acute kidney failure, unspecified: Secondary | ICD-10-CM | POA: Diagnosis not present

## 2017-06-15 DIAGNOSIS — G20 Parkinson's disease: Secondary | ICD-10-CM | POA: Diagnosis not present

## 2017-06-15 DIAGNOSIS — D696 Thrombocytopenia, unspecified: Secondary | ICD-10-CM | POA: Diagnosis not present

## 2017-06-15 DIAGNOSIS — D649 Anemia, unspecified: Secondary | ICD-10-CM | POA: Diagnosis not present

## 2017-06-15 DIAGNOSIS — R001 Bradycardia, unspecified: Secondary | ICD-10-CM | POA: Diagnosis not present

## 2017-06-15 LAB — GLUCOSE, CAPILLARY: GLUCOSE-CAPILLARY: 88 mg/dL (ref 65–99)

## 2017-06-15 LAB — SODIUM, URINE, RANDOM: Sodium, Ur: 20 mmol/L

## 2017-06-15 LAB — TROPONIN I

## 2017-06-15 LAB — D-DIMER, QUANTITATIVE: D-Dimer, Quant: 1.63 ug/mL-FEU — ABNORMAL HIGH (ref 0.00–0.50)

## 2017-06-15 LAB — CREATININE, URINE, RANDOM: CREATININE, URINE: 149.45 mg/dL

## 2017-06-15 LAB — LACTATE DEHYDROGENASE: LDH: 145 U/L (ref 98–192)

## 2017-06-15 LAB — SAVE SMEAR

## 2017-06-15 MED ORDER — IOPAMIDOL (ISOVUE-370) INJECTION 76%
INTRAVENOUS | Status: AC
  Administered 2017-06-15: 100 mL
  Filled 2017-06-15: qty 100

## 2017-06-15 NOTE — Procedures (Signed)
  Date 06/15/17  Referring physician Shirlean KellyXillin Niu  Reason for the study Altered mental status  Technical Digital EEG recording using 10-20 international Electrode system  Description of the recording Posterior dominant rhythm 8-8.5Hz  bilateral with reactivity Sleep was not obtaine Mild genelized slowing during drowsiness Epileptiform activity was not seen during this recording  Impression The EEG is normal in awake and drowsy states.

## 2017-06-15 NOTE — Evaluation (Signed)
Physical Therapy Evaluation Patient Details Name: Perry [name] MRN: [id number] DOB: July 13, 1929 Today's Date: 06/15/2017   History of Present Illness  Perry [name] is a 83 y.o. male with medical history significant of hypertension, hyperlipidemia, depression, CAD, Parkinson's disease, bradycardia, s/p of TURP, thrombocytopenia, who presents with and responsiveness.  Clinical Impression  Pt has had 2 falls this last week.  Family had started [facility] services and hired private pay help at home for ADLs.  Pt with decline over last month.  Pt would benefit from more skilled PT services - recommending SNF to help him get stronger and able to safely be home with wife in home setting.    Follow Up Recommendations SNF    Equipment Recommendations  None recommended by PT    Recommendations for Other Services       Precautions / Restrictions Precautions Precautions: Fall Precaution Comments: pt has had 3 falls in 3 months - 2 of them were last week      Mobility  Bed Mobility Overal bed mobility: Needs Assistance Bed Mobility: Supine to Sit     Supine to sit: Mod assist;HOB elevated Sit to supine: Mod assist   General bed mobility comments: pt needed mod assist getting to EOB - had hard time scooting hips forward to get feet on the floor.  seemed to have hard time initing movement  Transfers Overall transfer level: Needs assistance Equipment used: Rolling walker (2 wheeled) Transfers: Sit to/from [employer] Sit to Stand: Mod assist;+2 safety/equipment Stand pivot transfers: Mod assist;+2 safety/equipment       General transfer comment: pt needed to have BM once up - PT and OT walked pt to bathroom and he sat on BSC over toilet.  pt had hard time side stepping.  Pt needed verbal cues for all mobilty.  Pt standing in flexed posture and leans to the left.  pt needed mod assist for balance in standing and walking.  +2 for equipment and  safety.  Ambulation/Gait Ambulation/Gait assistance: Mod assist Ambulation Distance (Feet): 15 Feet Assistive device: Rolling walker (2 wheeled) Gait Pattern/deviations: Decreased step length - right;Decreased step length - left;Decreased stance time - right;Shuffle;Trunk flexed;Leaning posteriorly     General Gait Details: see above.  pt walked to the bathroom - never got a rythm in gait due to short distance.  pt stayed in flexed posture and needed mod assist for balance - leaning left and posterior  Stairs            Wheelchair Mobility    Modified Rankin (Stroke Patients Only)       Balance Overall balance assessment: Needs assistance Sitting-balance support: Bilateral upper extremity supported Sitting balance-Leahy Scale: Fair Sitting balance - Comments: Pt leaning backward and left, definite need of hands to stay upright   Standing balance support: Bilateral upper extremity supported Standing balance-Leahy Scale: Fair Standing balance comment: Pt needing RW to maintain balance.  Pt standing in flexed /crouched posture                              Pertinent Vitals/Pain Pain Assessment: No/denies pain    Home Living Family/patient expects to be discharged to:: Skilled nursing facility                 Additional Comments: pt uses RW all the time at home.  pt had OPPT for 2 months - 2 months ago.  Pt started HHPT on friday  before admit    Prior Function           Comments: pt has been getting weaker over the last few months.  pt lives at home with wifes assist - just hired some additional help starting last week.  pt and wife have planned to move up to [place] to be near daughter (in [place]).  they are moving into independent living - daughter wondering if ALF will be better fit for pt     Hand Dominance        Extremity/Trunk Assessment   Upper Extremity Assessment Upper Extremity Assessment: Defer to OT evaluation    Lower  Extremity Assessment Lower Extremity Assessment: Generalized weakness    Cervical / Trunk Assessment Cervical / Trunk Assessment: Kyphotic  Communication   Communication: No difficulties  Cognition Arousal/Alertness: Awake/alert Behavior During Therapy: WFL for tasks assessed/performed;Flat affect Overall Cognitive Status: Within Functional Limits for tasks assessed                                 General Comments: family said pt sleeping more than usual      General Comments      Exercises     Assessment/Plan    PT Assessment Patient needs continued PT services  PT Problem List Decreased strength;Decreased range of motion;Decreased activity tolerance;Decreased balance;Decreased mobility;Decreased knowledge of use of DME;Decreased safety awareness;Cardiopulmonary status limiting activity       PT Treatment Interventions DME instruction;Gait training;Functional mobility training;Therapeutic activities;Therapeutic exercise;Balance training;Patient/family education    PT Goals (Current goals can be found in the Care Plan section)  Acute Rehab PT Goals Patient Stated Goal: to get stronger and to get moved to [place] - closer to daughter PT Goal Formulation: With patient/family Time For Goal Achievement: 06/28/17 Potential to Achieve Goals: Fair    Frequency Min 3X/week   Barriers to discharge   pt needs more skilled PT services to help him get stronger and help decrease his fall potential    Co-evaluation PT/OT/SLP Co-Evaluation/Treatment: Yes Reason for Co-Treatment: For patient/therapist safety;To address functional/ADL transfers PT goals addressed during session: Mobility/safety with mobility OT goals addressed during session: ADL's and self-care       AM-PAC PT "6 Clicks" Daily Activity  Outcome Measure Difficulty turning over in bed (including adjusting bedclothes, sheets and blankets)?: A Little Difficulty moving from lying on back to sitting  on the side of the bed? : A Lot Difficulty sitting down on and standing up from a chair with arms (e.g., wheelchair, bedside commode, etc,.)?: A Lot Help needed moving to and from a bed to chair (including a wheelchair)?: A Lot Help needed walking in hospital room?: A Lot Help needed climbing 3-5 steps with a railing? : Total 6 Click Score: 12    End of Session Equipment Utilized During Treatment: Gait belt Activity Tolerance: Patient limited by fatigue Patient left: in chair;with chair alarm set;with family/visitor present Nurse Communication: Mobility status PT Visit Diagnosis: Muscle weakness (generalized) (M62.81);History of falling (Z91.81)    Time: 1510-1600 PT Time Calculation (min) (ACUTE ONLY): 50 min   Charges:   PT Evaluation $PT Eval Low Complexity: 1 Low PT Treatments $Gait Training: 8-22 mins $Therapeutic Activity: 8-22 mins   PT G Codes:   PT G-Codes **NOT FOR INPATIENT CLASS** Functional Assessment Tool Used: Clinical judgement Functional Limitation: Mobility: Walking and moving around Mobility: Walking and Moving Around Current Status ([id number]): At least 40 percent but  less than 60 percent impaired, limited or restricted Mobility: Walking and Moving Around Goal Status [phone]): At least 40 percent but less than 60 percent impaired, limited or restricted Mobility: Walking and Moving Around Discharge Status [id number]): At least 20 percent but less than 40 percent impaired, limited or restricted    06/15/2017   Ranae Palms, PT   Judson Roch 06/15/2017, 4:33 PM

## 2017-06-15 NOTE — Evaluation (Signed)
Occupational Therapy Evaluation Patient Details Name: Perry [name] MRN: [id number] DOB: 1929-03-26 Today's Date: 06/15/2017    History of Present Illness Perry [name] is a 83 y.o. male with medical history significant of hypertension, hyperlipidemia, depression, CAD, Parkinson's disease, bradycardia, s/p of TURP, thrombocytopenia, who presents with and responsiveness.   Clinical Impression   Pt had only recently been needing help with ADL (just got PCA over the past week) and was supervision for mobility. Pt is currently mod A for ADL and mod A +2 for mobility with RW. Pt with decreased safety awareness, balance, activity tolerance. Please see OT problem list below. Pt very sweet and wants to get stronger and better. Pt will require skilled OT in the acute care setting and intensity of SNF level therapy to maximize safety and independence of ADL and functional transfers to return Pt to PLOF.     Follow Up Recommendations  SNF;Supervision/Assistance - 24 hour    Equipment Recommendations  Other (comment)(defer to next venue of care)    Recommendations for Other Services       Precautions / Restrictions Precautions Precautions: Fall Precaution Comments: pt has had 3 falls in 3 months - 2 of them were last week Restrictions Weight Bearing Restrictions: No      Mobility Bed Mobility Overal bed mobility: Needs Assistance Bed Mobility: Supine to Sit     Supine to sit: Mod assist;HOB elevated Sit to supine: Mod assist   General bed mobility comments: pt needed mod assist getting to EOB - had hard time scooting hips forward to get feet on the floor.  seemed to have hard time initing movement  Transfers Overall transfer level: Needs assistance Equipment used: Rolling walker (2 wheeled) Transfers: Sit to/from [employer] Sit to Stand: Mod assist;+2 safety/equipment Stand pivot transfers: Mod assist;+2 safety/equipment       General transfer comment: pt  needed to have BM once up - PT and OT walked pt to bathroom and he sat on BSC over toilet.  pt had hard time side stepping.  Pt needed verbal cues for all mobilty.  Pt standing in flexed posture and leans to the left.  pt needed mod assist for balance in standing and walking.  +2 for equipment and safety.    Balance Overall balance assessment: Needs assistance Sitting-balance support: Bilateral upper extremity supported Sitting balance-Leahy Scale: Fair Sitting balance - Comments: Pt leaning backward and left, definite need of hands to stay upright   Standing balance support: Bilateral upper extremity supported Standing balance-Leahy Scale: Fair Standing balance comment: Pt needing RW to maintain balance.  Pt standing in flexed /crouched posture                            ADL either performed or assessed with clinical judgement   ADL Overall ADL's : Needs assistance/impaired Eating/Feeding: Modified independent;Sitting   Grooming: Wash/dry hands;Moderate assistance;Cueing for safety;Standing Grooming Details (indicate cue type and reason): Pt fatigues without awareness, knees slowly folding under himself Upper Body Bathing: Set up   Lower Body Bathing: Moderate assistance   Upper Body Dressing : Set up   Lower Body Dressing: Moderate assistance;+2 for safety/equipment   Toilet Transfer: Moderate assistance;+2 for safety/equipment;Ambulation;Comfort height toilet;RW;Grab bars Toilet Transfer Details (indicate cue type and reason): vc for safe hand placement Toileting- Clothing Manipulation and Hygiene: Maximal assistance;Sit to/from stand       Functional mobility during ADLs: Moderate assistance;+2 for safety/equipment;Rolling walker;Cueing for  safety;Cueing for sequencing       Vision Baseline Vision/History: Wears glasses Wears Glasses: At all times Patient Visual Report: No change from baseline       Perception     Praxis      Pertinent Vitals/Pain Pain  Assessment: No/denies pain     Hand Dominance     Extremity/Trunk Assessment Upper Extremity Assessment Upper Extremity Assessment: Generalized weakness   Lower Extremity Assessment Lower Extremity Assessment: Defer to PT evaluation   Cervical / Trunk Assessment Cervical / Trunk Assessment: Kyphotic   Communication Communication Communication: No difficulties   Cognition Arousal/Alertness: Awake/alert Behavior During Therapy: WFL for tasks assessed/performed;Flat affect Overall Cognitive Status: Within Functional Limits for tasks assessed                                 General Comments: family said pt sleeping more than usual   General Comments  Pt's wife and daughter present initially, then left once seeing how Pt moved    Exercises     Shoulder Instructions      Home Living Family/patient expects to be discharged to:: Skilled nursing facility                                 Additional Comments: pt uses RW all the time at home.  pt had OPPT for 2 months - 2 months ago.  Pt started HHPT on friday before admit      Prior Functioning/Environment Level of Independence: Independent with assistive device(s)        Comments: pt has been getting weaker over the last few months.  pt lives at home with wifes assist - just hired some additional help starting last week.  pt and wife have planned to move up to [place] to be near daughter (in [place]).  they are moving into independent living - daughter wondering if ALF will be better fit for pt        OT Problem List: Decreased strength;Decreased activity tolerance;Impaired balance (sitting and/or standing);Decreased safety awareness      OT Treatment/Interventions:      OT Goals(Current goals can be found in the care plan section) Acute Rehab OT Goals Patient Stated Goal: to get stronger and to get moved to [place] - closer to daughter OT Goal Formulation: With patient/family Time For Goal  Achievement: 06/29/17 Potential to Achieve Goals: Good ADL Goals Pt Will Perform Grooming: with supervision;standing Pt Will Transfer to Toilet: with supervision;ambulating Pt Will Perform Toileting - Clothing Manipulation and hygiene: with supervision;sitting/lateral leans Additional ADL Goal #1: Pt will perform bed mobility at mod I level prior to initiating ADL  OT Frequency: Min 2X/week   Barriers to D/C:            Co-evaluation PT/OT/SLP Co-Evaluation/Treatment: Yes Reason for Co-Treatment: Necessary to address cognition/behavior during functional activity;For patient/therapist safety;To address functional/ADL transfers PT goals addressed during session: Mobility/safety with mobility OT goals addressed during session: ADL's and self-care      AM-PAC PT "6 Clicks" Daily Activity     Outcome Measure Help from another person eating meals?: None Help from another person taking care of personal grooming?: A Little Help from another person toileting, which includes using toliet, bedpan, or urinal?: A Lot Help from another person bathing (including washing, rinsing, drying)?: A Lot Help from another person to put on and taking  off regular upper body clothing?: A Little Help from another person to put on and taking off regular lower body clothing?: A Lot 6 Click Score: 16   End of Session Equipment Utilized During Treatment: Gait belt;Rolling walker Nurse Communication: Mobility status  Activity Tolerance: Patient tolerated treatment well Patient left: in chair;with call bell/phone within reach;with chair alarm set  OT Visit Diagnosis: Unsteadiness on feet (R26.81);Other abnormalities of gait and mobility (R26.89);Repeated falls (R29.6);History of falling (Z91.81);Muscle weakness (generalized) (M62.81)                Time: 4098-1191 OT Time Calculation (min): 53 min Charges:  OT General Charges $OT Visit: 1 Visit OT Evaluation $OT Eval Moderate Complexity: 1 Mod OT  Treatments $Self Care/Home Management : 8-22 mins G-Codes: OT G-codes **NOT FOR INPATIENT CLASS** Functional Assessment Tool Used: AM-PAC 6 Clicks Daily Activity Functional Limitation: Self care Self Care Current Status (Y7829): At least 40 percent but Perry than 60 percent impaired, limited or restricted Self Care Goal Status (F6213): At least 1 percent but Perry than 20 percent impaired, limited or restricted   Sherryl Manges OTR/L [phone]  Evern Bio Bemnet Trovato 06/15/2017, 4:47 PM

## 2017-06-15 NOTE — Progress Notes (Signed)
EEG completed, results pending. 

## 2017-06-15 NOTE — Progress Notes (Signed)
New Admission Note:  Arrival Method: By bed from ED around 0200 Mental Orientation: Alert and oriented Telemetry: Box 22, CCMD notified Assessment: Completed Skin: Completed, refer to flowsheets IV: Right wrist and Left AC Pain: Denies Tubes: None Safety Measures: Safety Fall Prevention Plan was given, discussed  Admission: Completed [facility] Orientation: Patient has been orientated to the room, unit and the staff. Family: Wife and daughter at bedside  Orders have been reviewed and implemented. Will continue to monitor the patient. Call light has been placed within reach and bed alarm has been activated.   Alfonse Rashristy Ladeja Pelham, RN  Phone Number: [phone]

## 2017-06-15 NOTE — Progress Notes (Signed)
PROGRESS NOTE  Perry [name]:[id number] DOB: 09-May-1929 DOA: 06/14/2017 PCP: Lynnea Ferrier, MD  HPI/Recap of past 82 hours: 83 year old male with a medical history significant for hypertension, hyperlipidemia, CAD, Parkinson's disease, bradycardia, thrombocytopenia presented to the emergency room after patient was noted to be unconscious for about 10 minutes.  There is no seizure activity noted no focal neurologic deficits.  Patient admitted for ??syncope workup.  Today, patient denied any other complaints, denies any chest pain, shortness of breath, diarrhea, nausea/vomiting, diaphoresis.  Assessment/Plan: Principal Problem:   Unresponsiveness Active Problems:   Bradycardia   Parkinson's disease (HCC)   Hypertension   CAD (coronary artery disease)   AKI (acute kidney injury) (HCC)   Thrombocytopenia (HCC)   Normocytic anemia   Depression  ??Possible syncope Unclear etiology, hx of bradycardia, ??orthostatic Syncope workup, telemetry Troponin x3-, EKG with no acute ischemic changes CT head negative MRI brain, no ischemic changes, masses, hemorrhage EEG, normal Echo pending Continue maintenance  IV fluid PT/OT  #Elevated d-dimer Subsequent CT angiography chest negative for PE. Incidental finding of a 4.8 ascending aortic aneurysm, followed by PCP recommended  #AK I  Poor oral hydration as per family IV fluids Hold ARB Daily BMP  #CAD Chest pain-free Troponin x3- EKG no acute changes Echo pending  #Hypertension Controlled Hold Coreg, ARB  #Thrombocytopenia Chronic Daily CBC  #Bradycardia Chronic Consider reducing dose of Coreg or discontinue  #Parkinson's disease Continue Sinemet  #Constipation MiraLAX and Senokot   Code Status: Full  Family Communication: Discussed with daughter and wife  Disposition Plan: Home once stable   Consultants:  None  Procedures:  EEG on 11/4  Antimicrobials:  None  DVT  prophylaxis: SCD   Objective: Vitals:   06/15/17 0300 06/15/17 1112 06/15/17 1700 06/15/17 1950  BP: (!) 169/75 (!) 151/65 (!) 108/51 (!) 122/57  Pulse: 64 64 62 (!) 56  Resp: 17 17 18 16   Temp: 98 F (36.7 C) 98 F (36.7 C) 98.1 F (36.7 C) 97.9 F (36.6 C)  TempSrc: Oral Oral Oral   SpO2: 99% 99% 91% 99%  Weight: 71.8 kg (158 lb 4.6 oz)     Height: 5\' 8"  (1.727 m)       Intake/Output Summary (Last 24 hours) at 06/15/2017 2216 Last data filed at 06/15/2017 2204 Gross per 24 hour  Intake 1923 ml  Output 300 ml  Net 1623 ml   Filed Weights   06/14/17 1911 06/15/17 0300  Weight: 68 kg (150 lb) 71.8 kg (158 lb 4.6 oz)    Exam:   General: Alert, awake, oriented  Cardiovascular: S1-S2 present no added sound  Respiratory: Chest clear bilaterally  Abdomen: Soft, nontender, nondistended  Musculoskeletal: No bilateral pedal edema  Neuro: No focal neurologic deficit  Psychiatry: Normal mood   Data Reviewed: CBC: Recent Labs  Lab 06/14/17 1936  WBC 6.7  HGB 11.5*  HCT 34.9*  MCV 99.4  PLT 80*   Basic Metabolic Panel: Recent Labs  Lab 06/14/17 1936  NA 135  K 3.7  CL 101  CO2 28  GLUCOSE 118*  BUN 26*  CREATININE 1.27*  CALCIUM 8.6*   GFR: Estimated Creatinine Clearance: 40.4 mL/min (A) (by C-G formula based on SCr of 1.27 mg/dL (H)). Liver Function Tests: No results for input(s): AST, ALT, ALKPHOS, BILITOT, PROT, ALBUMIN in the last 168 hours. No results for input(s): LIPASE, AMYLASE in the last 168 hours. No results for input(s): AMMONIA in the last 168 hours. Coagulation Profile: No  results for input(s): INR, PROTIME in the last 168 hours. Cardiac Enzymes: Recent Labs  Lab 06/14/17 2358 06/15/17 0706 06/15/17 1334  TROPONINI <0.03 <0.03 <0.03   BNP (last 3 results) No results for input(s): PROBNP in the last 8760 hours. HbA1C: No results for input(s): HGBA1C in the last 72 hours. CBG: Recent Labs  Lab 06/14/17 1925 06/15/17 0811   GLUCAP 142* 88   Lipid Profile: No results for input(s): CHOL, HDL, LDLCALC, TRIG, CHOLHDL, LDLDIRECT in the last 72 hours. Thyroid Function Tests: No results for input(s): TSH, T4TOTAL, FREET4, T3FREE, THYROIDAB in the last 72 hours. Anemia Panel: No results for input(s): VITAMINB12, FOLATE, FERRITIN, TIBC, IRON, RETICCTPCT in the last 72 hours. Urine analysis:    Component Value Date/Time   COLORURINE YELLOW 06/14/2017 2044   APPEARANCEUR HAZY (A) 06/14/2017 2044   LABSPEC 1.021 06/14/2017 2044   PHURINE 5.0 06/14/2017 2044   GLUCOSEU NEGATIVE 06/14/2017 2044   HGBUR NEGATIVE 06/14/2017 2044   BILIRUBINUR NEGATIVE 06/14/2017 2044   KETONESUR 5 (A) 06/14/2017 2044   PROTEINUR NEGATIVE 06/14/2017 2044   UROBILINOGEN 0.2 02/26/2011 1507   NITRITE NEGATIVE 06/14/2017 2044   LEUKOCYTESUR NEGATIVE 06/14/2017 2044   Sepsis Labs: @LABRCNTIP (procalcitonin:4,lacticidven:4)  )No results found for this or any previous visit (from the past 240 hour(s)).    Studies: Ct Angio Chest Pe W Or Wo Contrast  Result Date: 06/15/2017 CLINICAL DATA:  Syncope. Positive D-dimer. History of myocardial infarction. EXAM: CT ANGIOGRAPHY CHEST WITH CONTRAST TECHNIQUE: Multidetector CT imaging of the chest was performed using the standard protocol during bolus administration of intravenous contrast. Multiplanar CT image reconstructions and MIPs were obtained to evaluate the vascular anatomy. CONTRAST:  100 cc Isovue 370 COMPARISON:  Chest radiograph November 13, 2010 FINDINGS: CARDIOVASCULAR: Adequate contrast opacification of the pulmonary artery's. Main pulmonary artery is enlarged, 3.8 cm. No pulmonary arterial filling defects to the level of the subsegmental branches. Heart size is normal, no right heart strain. No pericardial effusion. Thoracic aorta is 4.8 cm in transaxial dimension with calcific atherosclerosis. MEDIASTINUM/NODES: No lymphadenopathy by CT size criteria. LUNGS/PLEURA: Tracheobronchial tree is  patent, no pneumothorax. Mild bronchial wall thickening. Small RIGHT pleural effusion. Bilateral lower lobe atelectasis. Scattered calcified granulomas. UPPER ABDOMEN: Included view of the abdomen is nonacute. Status post cholecystectomy. MUSCULOSKELETAL: Enlarged heterogeneous thyroid without dominant nodule. Mild degenerative change of thoracic spine. No destructive bony lesions. Severe RIGHT, moderate LEFT shoulder osteoarthrosis. Review of the MIP images confirms the above findings. IMPRESSION: 1. No acute pulmonary embolism. 2. Mild cardiomegaly. 3. Small pleural effusions and bilateral lower lobe atelectasis. Mild bronchial wall thickening seen with bronchitis or reactive airway disease. 4. **An incidental finding of potential clinical significance has been found. 4.8 cm ascending aorta aneurysm. Recommend semi-annual imaging followup by CTA or MRA and referral to cardiothoracic surgery if not already obtained. This recommendation follows 2010 ACCF/AHA/AATS/ACR/ASA/SCA/SCAI/SIR/STS/SVM Guidelines for the Diagnosis and Management of Patients With Thoracic Aortic Disease. Circulation. 2010; 121: Z610-R604** Aortic Atherosclerosis (ICD10-I70.0). Electronically Signed   By: Awilda Metro M.D.   On: 06/15/2017 06:25   Mr Brain Wo Contrast  Result Date: 06/15/2017 CLINICAL DATA:  10 minutes syncopal episode yesterday. History of hypertension, hyperlipidemia, Parkinson's disease. EXAM: MRI HEAD WITHOUT CONTRAST TECHNIQUE: Multiplanar, multiecho pulse sequences of the brain and surrounding structures were obtained without intravenous contrast. COMPARISON:  CT HEAD June 14, 2017 MRI of the head November 13, 2015 FINDINGS: BRAIN: No reduced diffusion to suggest acute ischemia. Scattered supra- and infratentorial micro hemorrhages in a  nonspecific distribution. Moderate to severe ventriculomegaly on the basis of parenchymal brain volume loss though, there is disproportionate sulcal effacement at the convexities  with lobulated lateral ventricle contour. Patchy to confluent supratentorial white matter FLAIR T2 hyperintensities. Prominent bilateral basal ganglia and thalamus perivascular spaces associated with chronic small vessel ischemic disease. Old LEFT thalamus lacunar infarct. No midline shift, mass effect or masses. No abnormal extra-axial fluid collections. VASCULAR: Normal major intracranial vascular flow voids present at skull base. SKULL AND UPPER CERVICAL SPINE: No abnormal sellar expansion. No suspicious calvarial bone marrow signal. Craniocervical junction maintained. SINUSES/ORBITS: Trace paranasal sinus mucosal thickening without air-fluid levels. Trace LEFT mastoid effusion. The included ocular globes and orbital contents are non-suspicious. Status post bilateral ocular lens implants. OTHER: None. IMPRESSION: 1. No acute intracranial process. 2. Stable moderate to severe atrophy with a component of suspected normal pressure hydrocephalus. 3. Moderate chronic small vessel ischemic disease. Old lacunar infarct. Electronically Signed   By: Awilda Metroourtnay  Bloomer M.D.   On: 06/15/2017 05:59    Scheduled Meds: . B-complex with vitamin C  1 tablet Oral Daily  . calcium-vitamin D  1 tablet Oral Daily  . carbidopa-levodopa  2 tablet Oral TID  . cholecalciferol  2,000 Units Oral Daily  . cyanocobalamin  500 mcg Oral Daily  . donepezil  10 mg Oral QHS  . doxazosin  8 mg Oral BID  . fluticasone  2 spray Each Nare Daily  . magnesium oxide  200 mg Oral BID  . megestrol  20 mg Oral Daily  . mirabegron ER  50 mg Oral BID  . omega-3 acid ethyl esters  1 g Oral Daily  . polyethylene glycol  17 g Oral Daily  . sertraline  150 mg Oral QHS  . sodium chloride flush  3 mL Intravenous Q12H  . timolol  1 drop Left Eye BID    Continuous Infusions: . sodium chloride 100 mL/hr at 06/15/17 1731     LOS: 0 days     Briant CedarNkeiruka J Sander Speckman, MD [facility]   If 7PM-7AM, please contact  night-coverage [email or web address] Password [email or web address] 06/15/2017, 10:16 PM

## 2017-06-15 NOTE — ED Notes (Signed)
Attempted to call report x 1  

## 2017-06-16 ENCOUNTER — Observation Stay (HOSPITAL_BASED_OUTPATIENT_CLINIC_OR_DEPARTMENT_OTHER): Payer: Medicare Other

## 2017-06-16 DIAGNOSIS — R55 Syncope and collapse: Secondary | ICD-10-CM

## 2017-06-16 DIAGNOSIS — I10 Essential (primary) hypertension: Secondary | ICD-10-CM | POA: Diagnosis not present

## 2017-06-16 DIAGNOSIS — R001 Bradycardia, unspecified: Secondary | ICD-10-CM | POA: Diagnosis not present

## 2017-06-16 DIAGNOSIS — N179 Acute kidney failure, unspecified: Secondary | ICD-10-CM | POA: Diagnosis not present

## 2017-06-16 DIAGNOSIS — R4189 Other symptoms and signs involving cognitive functions and awareness: Secondary | ICD-10-CM | POA: Diagnosis not present

## 2017-06-16 LAB — BASIC METABOLIC PANEL
Anion gap: 5 (ref 5–15)
BUN: 20 mg/dL (ref 6–20)
CHLORIDE: 106 mmol/L (ref 101–111)
CO2: 25 mmol/L (ref 22–32)
CREATININE: 0.9 mg/dL (ref 0.61–1.24)
Calcium: 7.8 mg/dL — ABNORMAL LOW (ref 8.9–10.3)
GFR calc Af Amer: >60 mL/min (ref >60)
GFR calc non Af Amer: >60 mL/min (ref >60)
GLUCOSE: 90 mg/dL (ref 65–99)
Potassium: 3.4 mmol/L — ABNORMAL LOW (ref 3.5–5.1)
Sodium: 136 mmol/L (ref 135–145)

## 2017-06-16 LAB — ECHOCARDIOGRAM COMPLETE
HEIGHTINCHES: 68 in
WEIGHTICAEL: 2532.64 [oz_av]

## 2017-06-16 LAB — CBC WITH DIFFERENTIAL/PLATELET
Basophils Absolute: 0 10*3/uL (ref 0.0–0.1)
Basophils Relative: 0 %
Eosinophils Absolute: 0.1 10*3/uL (ref 0.0–0.7)
Eosinophils Relative: 3 %
HEMATOCRIT: 29.3 % — AB (ref 39.0–52.0)
Hemoglobin: 9.7 g/dL — ABNORMAL LOW (ref 13.0–17.0)
LYMPHS ABS: 0.6 10*3/uL — AB (ref 0.7–4.0)
Lymphocytes Relative: 12 %
MCH: 33 pg (ref 26.0–34.0)
MCHC: 33.1 g/dL (ref 30.0–36.0)
MCV: 99.7 fL (ref 78.0–100.0)
MONOS PCT: 10 %
Monocytes Absolute: 0.5 10*3/uL (ref 0.1–1.0)
NEUTROS ABS: 3.5 10*3/uL (ref 1.7–7.7)
NEUTROS PCT: 75 %
Platelets: 64 10*3/uL — ABNORMAL LOW (ref 150–400)
RBC: 2.94 MIL/uL — ABNORMAL LOW (ref 4.22–5.81)
RDW: 15.2 % (ref 11.5–15.5)
WBC: 4.7 10*3/uL (ref 4.0–10.5)

## 2017-06-16 LAB — GLUCOSE, CAPILLARY: Glucose-Capillary: 91 mg/dL (ref 65–99)

## 2017-06-16 LAB — PATHOLOGIST SMEAR REVIEW

## 2017-06-16 NOTE — Care Management Note (Signed)
Case Management Note  Patient Details  Name: Perry [name] MRN: [id number] Date of Birth: [birth year]  Subjective/Objective:   Pt in with unresponsiveness. He is from home with his spouse.                  Action/Plan: PT/OT recommending SNF. CM met with the patients wife and she is in agreement. She would like a facility in [place] or as close to [facility] as possible. She would also consider [facility]. She is not interested in [facility] in [place]. CSW updated. CM following.    Expected Discharge Date:                  Expected Discharge Plan:  Skilled Nursing Facility  In-House Referral:  Clinical Social Work  Discharge planning Services     Post Acute Care Choice:    Choice offered to:     DME Arranged:    DME Agency:     HH Arranged:    [address] Agency:     Status of Service:  In process, will continue to follow  If discussed at Long Length of Stay Meetings, dates discussed:    Additional Comments:  Pollie Friar, RN 06/16/2017, 1:12 PM

## 2017-06-16 NOTE — Progress Notes (Signed)
Patient will DC to: [facility] Anticipated DC date: 06/16/17 Family notified: Spouse, daughter Transport by: [employer]   Per MD patient ready for DC to [facility]. RN, patient, patient's family, and facility notified of DC. Discharge Summary sent to facility. RN given number for report [phone]).  CSW signing off.  Cristobal GoldmannNadia Zayla Agar, [place] Clinical Social Worker [phone]

## 2017-06-16 NOTE — Clinical Social Work Note (Signed)
Clinical Social Work Assessment  Patient Details  Name: Perry [name] Conly MRN: [id number] Date of Birth: 12/09/1928  Date of referral:  06/16/17               Reason for consult:  Facility Placement                Permission sought to share information with:  Family Supports, Magazine features editoracility Contact Representative Permission granted to share information::  Yes, Verbal Permission Granted  Name::     Vernona RiegerLaura [phone]  Agency::  SNFs  Relationship::  Spouse  Contact Information:     Housing/Transportation Living arrangements for the past 2 months:  Single Family Home Source of Information:  Spouse Patient Interpreter Needed:  None Criminal Activity/Legal Involvement Pertinent to Current Situation/Hospitalization:  No - Comment as needed Significant Relationships:  Adult Children, Spouse Lives with:  Spouse Do you feel safe going back to the place where you live?  No Need for family participation in patient care:  Yes (Comment)  Care giving concerns:  CSW received consult for possible SNF placement at time of discharge. CSW spoke with patient's spouse and daughter regarding PT recommendation of SNF placement at time of discharge. Patient's spouse reported that patient's spouse is currently unable to care for patient at their home given patient's current physical needs and fall risk. Patient expressed understanding of PT recommendation and is agreeable to SNF placement at time of discharge. CSW to continue to follow and assist with discharge planning needs.   Social Worker assessment / plan:  CSW spoke with patient concerning [facility] before returning home.  Employment status:  Retired Database administratornsurance information:  Managed Medicare PT Recommendations:  Skilled Nursing Facility Information / Referral to community resources:  Skilled Nursing Facility  Patient/Family's Response to care:  Patient recognizes need for rehab before returning home and is agreeable [place]. Patient reported [facility] for [facility].  Patient/Family's Understanding of and Emotional Response to Diagnosis, Current Treatment, and Prognosis:  Patient/family is realistic regarding therapy needs and expressed being hopeful for SNF placement. Patient expressed understanding of CSW role and discharge process as well as medical condition. No questions/concerns about plan or treatment.    Emotional Assessment Appearance:  Appears stated age Attitude/Demeanor/Rapport:  Unable to Assess Affect (typically observed):  Accepting Orientation:  Oriented to Self, Oriented to Place, Oriented to Situation, Oriented to  Time Alcohol / Substance use:  Not Applicable Psych involvement (Current and /or in the community):  No (Comment)  Discharge Needs  Concerns to be addressed:  Care Coordination Readmission within the last 30 days:  Yes Current discharge risk:  None Barriers to Discharge:  No Barriers Identified   Mearl Latinadia S Rmoni Keplinger, LCSWA 06/16/2017, 4:56 PM

## 2017-06-16 NOTE — Clinical Social Work Placement (Signed)
   CLINICAL SOCIAL WORK PLACEMENT  NOTE  Date:  06/16/2017  Patient Details  Name: Perry [name] R [place] MRN: [id number] Date of Birth: 01/26/1929  Clinical Social Work is seeking post-discharge placement for this patient at the Skilled  Nursing Facility level of care (*CSW will initial, date and re-position this form in  chart as items are completed):  Yes   Patient/family provided with [facility] Clinical Social Work Department's list of facilities offering this level of care within the geographic area requested by the patient (or if unable, by the patient's family).  Yes   Patient/family informed of their freedom to choose among providers that offer the needed level of care, that participate in Medicare, Medicaid or managed care program needed by the patient, have an available bed and are willing to accept the patient.  Yes   Patient/family informed of [facility]'s ownership interest in [facility] and [facility], as well as of the fact that they are under no obligation to receive care at these facilities.  PASRR submitted to EDS on       PASRR number received on       Existing PASRR number confirmed on 06/16/17     FL2 transmitted to all facilities in geographic area requested by pt/family on 06/16/17     FL2 transmitted to all facilities within larger geographic area on       Patient informed that his/her managed care company has contracts with or will negotiate with certain facilities, including the following:        Yes   Patient/family informed of bed offers received.  [facility]     Physician recommends and patient chooses bed at      Patient to be transferred to [facility] on 06/16/17.  Patient to be transferred to facility by Car     Patient family notified on 06/16/17 of transfer.  Name of family member notified:  Daughter and spouse     PHYSICIAN Please sign FL2     Additional Comment:     _______________________________________________ Mearl LatinNadia S Eivan Gallina, LCSWA 06/16/2017, 4:57 PM

## 2017-06-16 NOTE — Care Management Obs Status (Signed)
MEDICARE OBSERVATION STATUS NOTIFICATION   Patient Details  Name: Perry [name] MRN: [id number] Date of Birth: 09/09/1928   Medicare Observation Status Notification Given:  Yes    Kermit BaloKelli F Jaecob Lowden, RN 06/16/2017, 12:17 PM

## 2017-06-16 NOTE — Progress Notes (Signed)
Pierre Bali to be D/C'd [facility]) per MD order.  Discussed prescriptions and follow up appointments with the patient. Prescriptions given to patient, medication list explained in detail. Pt verbalized understanding.  Allergies as of 06/16/2017      Reactions   Tetanus Toxoids Other (See Comments)   REACTION: " 50 CENT PIECE SIZE WELTS AND CLOSES MY EYES"   Atorvastatin Other (See Comments)   REACTION: " MUSCLE WEAKNESS"   Rosuvastatin Other (See Comments)   REACTION: " MUSCLE WEAKNESS"      Medication List    STOP taking these medications   carvedilol 12.5 MG tablet Commonly known as:  COREG   cephALEXin 500 MG capsule Commonly known as:  KEFLEX     TAKE these medications   arginine 500 MG tablet Take 1,000 mg by mouth 2 (two) times daily.   B-COMPLEX/B-12 PO Take 1 capsule by mouth daily.   benazepril 20 MG tablet Commonly known as:  LOTENSIN Take 20 mg by mouth 2 (two) times daily.   calcium-vitamin D 500-200 MG-UNIT tablet Commonly known as:  OSCAL WITH D Take 1 tablet by mouth daily.   carbidopa-levodopa 25-100 MG tablet Commonly known as:  SINEMET IR Take 2 tablets by mouth 3 (three) times daily.   CIALIS 20 MG tablet Generic drug:  tadalafil Take 20 mg by mouth daily as needed. (erectile dysfunction)   Co Q-10 100 MG Caps Take 100 mg by mouth daily.   donepezil 10 MG tablet Commonly known as:  ARICEPT Take 10 mg by mouth at bedtime.   doxazosin 8 MG tablet Commonly known as:  CARDURA Take 1 tablet (8 mg total) by mouth 2 (two) times daily.   fish oil-omega-3 fatty acids 1000 MG capsule Take 2 g by mouth daily.   fluticasone 50 MCG/ACT nasal spray Commonly known as:  FLONASE Place 2 sprays into both nostrils daily.   HYDROcodone-acetaminophen 5-325 MG tablet Commonly known as:  NORCO/VICODIN Take 1 tablet by mouth every 4 (four) hours as needed for moderate pain.   Magnesium 200 MG Tabs Take 200 mg by mouth 2 (two) times daily.    megestrol 20 MG tablet Commonly known as:  MEGACE Take 20 mg by mouth daily.   MYRBETRIQ 50 MG Tb24 tablet Generic drug:  mirabegron ER Take 100 mg by mouth at bedtime.   potassium chloride SA 20 MEQ tablet Commonly known as:  K-DUR,KLOR-CON Take 20 mEq by mouth 2 (two) times daily.   sertraline 50 MG tablet Commonly known as:  ZOLOFT Take 150 mg by mouth at bedtime.   timolol 0.5 % ophthalmic solution Commonly known as:  TIMOPTIC Place 1 drop into the left eye 2 (two) times daily.   V-R VITAMIN B-12 500 MCG tablet Generic drug:  cyanocobalamin Take 500 mcg by mouth daily.   Vitamin D 2000 units Caps Take 2,000 Units by mouth daily.       Vitals:   06/16/17 0558 06/16/17 1726  BP: (!) 167/58 124/62  Pulse: (!) 59 60  Resp: 16 18  Temp: 98.4 F (36.9 C) 98 F (36.7 C)  SpO2: 95% 98%    Skin clean, dry and intact without evidence of skin break down, no evidence of skin tears noted. IV catheter discontinued intact. Site without signs and symptoms of complications. Dressing and pressure applied. Pt denies pain at this time. No complaints noted.  An After Visit Summary was printed and given to the patient. Patient escorted via WC, and D/C  home via private auto.  Fara BorosSara Baudelia Schroepfer BSN, RN [facility] Phone [phone]

## 2017-06-16 NOTE — Progress Notes (Signed)
  Echocardiogram 2D Echocardiogram has been performed.  Perry SavoyCasey [name] [name] 06/16/2017, 11:48 AM

## 2017-06-16 NOTE — NC FL2 (Signed)
[place] MEDICAID FL2 LEVEL OF CARE SCREENING TOOL     IDENTIFICATION  Patient Name: Perry [name] Birthdate: 02/15/1929 Sex: male Admission Date (Current Location): 06/14/2017  [facility] [place]   Facility and Address:  [facility] [place], [address], [place], [place] [address]      Provider Number: [id number]  Attending Physician Name and Address:  Briant CedarEzenduka, Nkeiruka J, MD  Relative Name and Phone Number:  Vernona RiegerLaura, spouse,[phone]    Current Level of Care: Hospital Recommended Level of Care:   Prior Approval Number:    Date Approved/Denied:   PASRR Number: [id number]  Discharge Plan: Home    Current Diagnoses: Patient Active Problem List   Diagnosis Date Noted  . CAD (coronary artery disease) 06/14/2017  . AKI (acute kidney injury) (HCC) 06/14/2017  . Unresponsiveness 06/14/2017  . Thrombocytopenia (HCC) 06/14/2017  . Normocytic anemia 06/14/2017  . Depression 06/14/2017  . Parkinson's disease (HCC)   . Hypertension   . Hyperlipidemia   . Bradycardia 05/17/2016  . Benign hypertensive heart disease without heart failure 12/23/2012  . Dyspnea on exertion 08/25/2012  . Pedal edema 08/25/2012  . Heart murmur 08/25/2012  . Right inguinal hernia 02/28/2011    Orientation RESPIRATION BLADDER Height & Weight     Self, Time, Situation, Place  Normal Continent, External catheter Weight: 71.8 kg (158 lb 4.6 oz) Height:  5\' 8"  (172.7 cm)  BEHAVIORAL SYMPTOMS/MOOD NEUROLOGICAL BOWEL NUTRITION STATUS      Continent Diet(Please see DC Summary)  AMBULATORY STATUS COMMUNICATION OF NEEDS Skin   Limited Assist Verbally Normal                       Personal Care Assistance Level of Assistance  Bathing, Feeding, Dressing, Total care Bathing Assistance: Limited assistance Feeding assistance: Limited assistance Dressing Assistance: Limited assistance     Functional Limitations Info             SPECIAL CARE  FACTORS FREQUENCY  PT (By licensed PT), OT (By licensed OT)     PT Frequency: 5x/week OT Frequency: 3x/week            Contractures Contractures Info: Not present    Additional Factors Info  Code Status, Allergies Code Status Info: Full Allergies Info: Tetanus Tetanus Toxoids, Atorvastatin, RosuvastatinToxoids, Atorvastatin, Rosuvastatin           Current Medications (06/16/2017):  This is the current hospital active medication list Current Facility-Administered Medications  Medication Dose Route Frequency Provider Last Rate Last Dose  . acetaminophen (TYLENOL) tablet 650 mg  650 mg Oral Q6H PRN Lorretta HarpNiu, Xilin, MD       Or  . acetaminophen (TYLENOL) suppository 650 mg  650 mg Rectal Q6H PRN Lorretta HarpNiu, Xilin, MD      . B-complex with vitamin C tablet 1 tablet  1 tablet Oral Daily Lorretta HarpNiu, Xilin, MD   1 tablet at 06/16/17 1008  . calcium-vitamin D (OSCAL WITH D) 500-200 MG-UNIT per tablet 1 tablet  1 tablet Oral Daily Lorretta HarpNiu, Xilin, MD   1 tablet at 06/16/17 1003  . carbidopa-levodopa (SINEMET IR) 25-100 MG per tablet immediate release 2 tablet  2 tablet Oral TID Lorretta HarpNiu, Xilin, MD   2 tablet at 06/16/17 1007  . cholecalciferol (VITAMIN D) tablet 2,000 Units  2,000 Units Oral Daily Lorretta HarpNiu, Xilin, MD   2,000 Units at 06/16/17 1002  . cyanocobalamin tablet 500 mcg  500 mcg Oral Daily Lorretta HarpNiu, Xilin, MD  500 mcg at 06/16/17 1009  . donepezil (ARICEPT) tablet 10 mg  10 mg Oral QHS Lorretta Harp, MD   10 mg at 06/15/17 2200  . doxazosin (CARDURA) tablet 8 mg  8 mg Oral BID Lorretta Harp, MD   8 mg at 06/16/17 1005  . fluticasone (FLONASE) 50 MCG/ACT nasal spray 2 spray  2 spray Each Nare Daily Lorretta Harp, MD      . hydrALAZINE (APRESOLINE) injection 5 mg  5 mg Intravenous Q2H PRN Lorretta Harp, MD      . HYDROcodone-acetaminophen (NORCO/VICODIN) 5-325 MG per tablet 1 tablet  1 tablet Oral Q4H PRN Lorretta Harp, MD      . magnesium oxide (MAG-OX) tablet 200 mg  200 mg Oral BID Lorretta Harp, MD   200 mg at 06/16/17 1004  .  megestrol (MEGACE) tablet 20 mg  20 mg Oral Daily Lorretta Harp, MD   20 mg at 06/16/17 1008  . mirabegron ER (MYRBETRIQ) tablet 50 mg  50 mg Oral BID Lorretta Harp, MD   50 mg at 06/16/17 1226  . omega-3 acid ethyl esters (LOVAZA) capsule 1 g  1 g Oral Daily Lorretta Harp, MD   1 g at 06/16/17 1003  . ondansetron (ZOFRAN) tablet 4 mg  4 mg Oral Q6H PRN Lorretta Harp, MD       Or  . ondansetron (ZOFRAN) injection 4 mg  4 mg Intravenous Q6H PRN Lorretta Harp, MD      . polyethylene glycol (MIRALAX / GLYCOLAX) packet 17 g  17 g Oral Daily Lorretta Harp, MD   17 g at 06/16/17 0958  . senna-docusate (Senokot-S) tablet 1 tablet  1 tablet Oral QHS PRN Lorretta Harp, MD      . sertraline (ZOLOFT) tablet 150 mg  150 mg Oral QHS Lorretta Harp, MD   150 mg at 06/15/17 0345  . sodium chloride flush (NS) 0.9 % injection 3 mL  3 mL Intravenous Q12H Lorretta Harp, MD   3 mL at 06/16/17 1144  . timolol (TIMOPTIC) 0.5 % ophthalmic solution 1 drop  1 drop Left Eye BID Lorretta Harp, MD   1 drop at 06/16/17 1229     Discharge Medications: Please see discharge summary for a list of discharge medications.  Relevant Imaging Results:  Relevant Lab Results:   Additional Information SSN # [id number]  Mearl Latin, LCSWA

## 2017-06-16 NOTE — Discharge Summary (Signed)
Discharge Summary  Perry [name] Perry [name] ZOX:[id number] DOB: 05/29/1929  PCP: Perry [name], Bert J III, MD  Admit date: 06/14/2017 Discharge date: 06/16/2017  Time spent: >7130mins  Recommendations for Outpatient Follow-up:  1. PCP  Discharge Diagnoses:  Active Hospital Problems   Diagnosis Date Noted  . Unresponsiveness 06/14/2017  . CAD (coronary artery disease) 06/14/2017  . AKI (acute kidney injury) (HCC) 06/14/2017  . Thrombocytopenia (HCC) 06/14/2017  . Normocytic anemia 06/14/2017  . Depression 06/14/2017  . Hypertension   . Parkinson's disease (HCC)   . Bradycardia 05/17/2016    Resolved Hospital Problems  No resolved problems to display.    Discharge Condition: Stable  Diet recommendation: Heart healthy  Vitals:   06/15/17 1950 06/16/17 0558  BP: (!) 122/57 (!) 167/58  Pulse: (!) 56 (!) 59  Resp: 16 16  Temp: 97.9 F (36.6 C) 98.4 F (36.9 C)  SpO2: 99% 95%    History of present illness:  83 year old male with a medical history significant for hypertension, hyperlipidemia, CAD, Parkinson's disease, bradycardia, thrombocytopenia presented to the emergency room after patient was noted to be unconscious for about 10 minutes.  There is no seizure activity noted, no focal neurologic deficits.  Patient admitted for  syncope workup.  Today, patient denied any other complaints, denies any chest pain, shortness of breath, diarrhea, nausea/vomiting, diaphoresis   Hospital Course:  Principal Problem:   Unresponsiveness Active Problems:   Bradycardia   Parkinson's disease (HCC)   Hypertension   CAD (coronary artery disease)   AKI (acute kidney injury) (HCC)   Thrombocytopenia (HCC)   Normocytic anemia   Depression   Possible syncope Unclear etiology, hx of bradycardia, ??orthostatic Syncope workup, telemetry Troponin x3-, EKG with no acute ischemic changes CT head negative MRI brain, no ischemic changes, masses, hemorrhage EEG, normal Echo LVEF WNL Advised to  stay hydrated PT/OT- Rec SNF placement  #Elevated d-dimer Subsequent CT angiography chest negative for PE. Incidental finding of a 4.8 ascending aortic aneurysm, followed by PCP recommended  #AK I  Resolved Poor oral hydration as per family S/p IV fluids Can restart ARBs as BP permits  #CAD Chest pain-free Troponin x3- EKG no acute changes Echo as above  #Hypertension Controlled Continue ARB as BP permits  #Thrombocytopenia Chronic  #Bradycardia Chronic Avoid b-blkers, a-blkers  #Parkinson's disease Continue Sinemet  #Constipation MiraLAX and Senokot   Procedures:  EEG on 11/4  Consultations:  None   Discharge Exam: BP (!) 167/58 (BP Location: Left Arm)   Pulse (!) 59   Temp 98.4 F (36.9 C) (Oral)   Resp 16   Ht 5\' 8"  (1.727 m)   Wt 71.8 kg (158 lb 4.6 oz)   SpO2 95%   BMI 24.07 kg/m   General: Alert, awake, oriented X3 Cardiovascular: S1-S2 present, no added hrt sound Respiratory: Chest clear bilaterally   Discharge Instructions You were cared for by a hospitalist during your hospital stay. If you have any questions about your discharge medications or the care you received while you were in the hospital after you are discharged, you can call the unit and asked to speak with the hospitalist on call if the hospitalist that took care of you is not available. Once you are discharged, your primary care physician will handle any further medical issues. Please note that NO REFILLS for any discharge medications will be authorized once you are discharged, as it is imperative that you return to your primary care physician (or establish a relationship with a primary care physician  if you do not have one) for your aftercare needs so that they can reassess your need for medications and monitor your lab values.  Discharge Instructions    Diet - low sodium heart healthy   Complete by:  As directed    Diet - low sodium heart healthy   Complete by:  As  directed    Increase activity slowly   Complete by:  As directed    Increase activity slowly   Complete by:  As directed      Allergies as of 06/16/2017      Reactions   Tetanus Toxoids Other (See Comments)   REACTION: " 50 CENT PIECE SIZE WELTS AND CLOSES MY EYES"   Atorvastatin Other (See Comments)   REACTION: " MUSCLE WEAKNESS"   Rosuvastatin Other (See Comments)   REACTION: " MUSCLE WEAKNESS"      Medication List    STOP taking these medications   carvedilol 12.5 MG tablet Commonly known as:  COREG   cephALEXin 500 MG capsule Commonly known as:  KEFLEX     TAKE these medications   arginine 500 MG tablet Take 1,000 mg by mouth 2 (two) times daily.   B-COMPLEX/B-12 PO Take 1 capsule by mouth daily.   benazepril 20 MG tablet Commonly known as:  LOTENSIN Take 20 mg by mouth 2 (two) times daily.   calcium-vitamin D 500-200 MG-UNIT tablet Commonly known as:  OSCAL WITH D Take 1 tablet by mouth daily.   carbidopa-levodopa 25-100 MG tablet Commonly known as:  SINEMET IR Take 2 tablets by mouth 3 (three) times daily.   CIALIS 20 MG tablet Generic drug:  tadalafil Take 20 mg by mouth daily as needed. (erectile dysfunction)   Co Q-10 100 MG Caps Take 100 mg by mouth daily.   donepezil 10 MG tablet Commonly known as:  ARICEPT Take 10 mg by mouth at bedtime.   doxazosin 8 MG tablet Commonly known as:  CARDURA Take 1 tablet (8 mg total) by mouth 2 (two) times daily.   fish oil-omega-3 fatty acids 1000 MG capsule Take 2 g by mouth daily.   fluticasone 50 MCG/ACT nasal spray Commonly known as:  FLONASE Place 2 sprays into both nostrils daily.   HYDROcodone-acetaminophen 5-325 MG tablet Commonly known as:  NORCO/VICODIN Take 1 tablet by mouth every 4 (four) hours as needed for moderate pain.   Magnesium 200 MG Tabs Take 200 mg by mouth 2 (two) times daily.   megestrol 20 MG tablet Commonly known as:  MEGACE Take 20 mg by mouth daily.   MYRBETRIQ 50 MG  Tb24 tablet Generic drug:  mirabegron ER Take 100 mg by mouth at bedtime.   potassium chloride SA 20 MEQ tablet Commonly known as:  K-DUR,KLOR-CON Take 20 mEq by mouth 2 (two) times daily.   sertraline 50 MG tablet Commonly known as:  ZOLOFT Take 150 mg by mouth at bedtime.   timolol 0.5 % ophthalmic solution Commonly known as:  TIMOPTIC Place 1 drop into the left eye 2 (two) times daily.   V-Perry VITAMIN B-12 500 MCG tablet Generic drug:  cyanocobalamin Take 500 mcg by mouth daily.   Vitamin D 2000 units Caps Take 2,000 Units by mouth daily.      Allergies  Allergen Reactions  . Tetanus Toxoids Other (See Comments)    REACTION: " 50 CENT PIECE SIZE WELTS AND CLOSES MY EYES"  . Atorvastatin Other (See Comments)    REACTION: " MUSCLE WEAKNESS"  . Rosuvastatin Other (  See Comments)    REACTION: " MUSCLE WEAKNESS"   Follow-up Information    Curtis Sites III, MD Follow up in 1 month(s).   Specialty:  Internal Medicine Contact information: [address] [place] [address] [phone]            The results of significant diagnostics from this hospitalization (including imaging, microbiology, ancillary and laboratory) are listed below for reference.    Significant Diagnostic Studies: Ct Head Wo Contrast  Result Date: 06/14/2017 CLINICAL DATA:  Altered mental status.  Dizziness. EXAM: CT HEAD WITHOUT CONTRAST TECHNIQUE: Contiguous axial images were obtained from the base of the skull through the vertex without intravenous contrast. COMPARISON:  Head CT 1 week prior 06/06/2017 FINDINGS: Brain: Stable degree of atrophy and chronic small vessel ischemia from prior exam. No intracranial hemorrhage, mass effect, or midline shift. No hydrocephalus. The basilar cisterns are patent. No evidence of territorial infarct or acute ischemia. No extra-axial or intracranial fluid collection. Vascular: Atherosclerosis of skullbase vasculature without hyperdense vessel or  abnormal calcification. Skull: No fracture or focal lesion. Sinuses/Orbits: Paranasal sinuses and mastoid air cells are clear. The visualized orbits are unremarkable. Bilateral cataract resection. Other: None. IMPRESSION: No acute intracranial abnormality. Stable atrophy and chronic small vessel ischemia. Electronically Signed   By: Rubye Oaks M.D.   On: 06/14/2017 21:20   Ct Head Wo Contrast  Result Date: 06/06/2017 CLINICAL DATA:  Increased weakness with recent fall EXAM: CT HEAD WITHOUT CONTRAST TECHNIQUE: Contiguous axial images were obtained from the base of the skull through the vertex without intravenous contrast. COMPARISON:  11/13/2015 FINDINGS: Brain: Chronic atrophic change and chronic white matter ischemic change is noted and stable from the prior exam. No findings to suggest acute hemorrhage, acute infarction or space-occupying mass lesion are noted. Vascular: No hyperdense vessel or unexpected calcification. Skull: Normal. Negative for fracture or focal lesion. Sinuses/Orbits: No acute finding. Other: None. IMPRESSION: Chronic changes as described without acute abnormality. Electronically Signed   By: Alcide Clever M.D.   On: 06/06/2017 13:01   Ct Angio Chest Pe W Or Wo Contrast  Result Date: 06/15/2017 CLINICAL DATA:  Syncope. Positive D-dimer. History of myocardial infarction. EXAM: CT ANGIOGRAPHY CHEST WITH CONTRAST TECHNIQUE: Multidetector CT imaging of the chest was performed using the standard protocol during bolus administration of intravenous contrast. Multiplanar CT image reconstructions and MIPs were obtained to evaluate the vascular anatomy. CONTRAST:  100 cc Isovue 370 COMPARISON:  Chest radiograph November 13, 2010 FINDINGS: CARDIOVASCULAR: Adequate contrast opacification of the pulmonary artery's. Main pulmonary artery is enlarged, 3.8 cm. No pulmonary arterial filling defects to the level of the subsegmental branches. Heart size is normal, no right heart strain. No pericardial  effusion. Thoracic aorta is 4.8 cm in transaxial dimension with calcific atherosclerosis. MEDIASTINUM/NODES: No lymphadenopathy by CT size criteria. LUNGS/PLEURA: Tracheobronchial tree is patent, no pneumothorax. Mild bronchial wall thickening. Small RIGHT pleural effusion. Bilateral lower lobe atelectasis. Scattered calcified granulomas. UPPER ABDOMEN: Included view of the abdomen is nonacute. Status post cholecystectomy. MUSCULOSKELETAL: Enlarged heterogeneous thyroid without dominant nodule. Mild degenerative change of thoracic spine. No destructive bony lesions. Severe RIGHT, moderate LEFT shoulder osteoarthrosis. Review of the MIP images confirms the above findings. IMPRESSION: 1. No acute pulmonary embolism. 2. Mild cardiomegaly. 3. Small pleural effusions and bilateral lower lobe atelectasis. Mild bronchial wall thickening seen with bronchitis or reactive airway disease. 4. **An incidental finding of potential clinical significance has been found. 4.8 cm ascending aorta aneurysm. Recommend semi-annual imaging followup by  CTA or MRA and referral to cardiothoracic surgery if not already obtained. This recommendation follows 2010 ACCF/AHA/AATS/ACR/ASA/SCA/SCAI/SIR/STS/SVM Guidelines for the Diagnosis and Management of Patients With Thoracic Aortic Disease. Circulation. 2010; 121: A540-J811** Aortic Atherosclerosis (ICD10-I70.0). Electronically Signed   By: Awilda Metro M.D.   On: 06/15/2017 06:25   Mr Brain Wo Contrast  Result Date: 06/15/2017 CLINICAL DATA:  10 minutes syncopal episode yesterday. History of hypertension, hyperlipidemia, Parkinson's disease. EXAM: MRI HEAD WITHOUT CONTRAST TECHNIQUE: Multiplanar, multiecho pulse sequences of the brain and surrounding structures were obtained without intravenous contrast. COMPARISON:  CT HEAD June 14, 2017 MRI of the head November 13, 2015 FINDINGS: BRAIN: No reduced diffusion to suggest acute ischemia. Scattered supra- and infratentorial micro  hemorrhages in a nonspecific distribution. Moderate to severe ventriculomegaly on the basis of parenchymal brain volume loss though, there is disproportionate sulcal effacement at the convexities with lobulated lateral ventricle contour. Patchy to confluent supratentorial white matter FLAIR T2 hyperintensities. Prominent bilateral basal ganglia and thalamus perivascular spaces associated with chronic small vessel ischemic disease. Old LEFT thalamus lacunar infarct. No midline shift, mass effect or masses. No abnormal extra-axial fluid collections. VASCULAR: Normal major intracranial vascular flow voids present at skull base. SKULL AND UPPER CERVICAL SPINE: No abnormal sellar expansion. No suspicious calvarial bone marrow signal. Craniocervical junction maintained. SINUSES/ORBITS: Trace paranasal sinus mucosal thickening without air-fluid levels. Trace LEFT mastoid effusion. The included ocular globes and orbital contents are non-suspicious. Status post bilateral ocular lens implants. OTHER: None. IMPRESSION: 1. No acute intracranial process. 2. Stable moderate to severe atrophy with a component of suspected normal pressure hydrocephalus. 3. Moderate chronic small vessel ischemic disease. Old lacunar infarct. Electronically Signed   By: Awilda Metro M.D.   On: 06/15/2017 05:59    Microbiology: No results found for this or any previous visit (from the past 240 hour(s)).   Labs: Basic Metabolic Panel: Recent Labs  Lab 06/14/17 1936 06/16/17 0503  NA 135 136  K 3.7 3.4*  CL 101 106  CO2 28 25  GLUCOSE 118* 90  BUN 26* 20  CREATININE 1.27* 0.90  CALCIUM 8.6* 7.8*   Liver Function Tests: No results for input(s): AST, ALT, ALKPHOS, BILITOT, PROT, ALBUMIN in the last 168 hours. No results for input(s): LIPASE, AMYLASE in the last 168 hours. No results for input(s): AMMONIA in the last 168 hours. CBC: Recent Labs  Lab 06/14/17 1936 06/16/17 0503  WBC 6.7 4.7  NEUTROABS  --  3.5  HGB 11.5*  9.7*  HCT 34.9* 29.3*  MCV 99.4 99.7  PLT 80* 64*   Cardiac Enzymes: Recent Labs  Lab 06/14/17 2358 06/15/17 0706 06/15/17 1334  TROPONINI <0.03 <0.03 <0.03   BNP: BNP (last 3 results) No results for input(s): BNP in the last 8760 hours.  ProBNP (last 3 results) No results for input(s): PROBNP in the last 8760 hours.  CBG: Recent Labs  Lab 06/14/17 1925 06/15/17 0811 06/16/17 0554  GLUCAP 142* 88 91       Signed:  Briant Cedar, MD [facility] 06/16/2017, 4:28 PM

## 2017-07-16 ENCOUNTER — Other Ambulatory Visit: Payer: Self-pay

## 2017-07-16 ENCOUNTER — Observation Stay
Admission: EM | Admit: 2017-07-16 | Discharge: 2017-07-17 | Disposition: A | Discharge status: Skilled Nursing Facility | Payer: Medicare Other | Attending: Internal Medicine | Admitting: Internal Medicine

## 2017-07-16 ENCOUNTER — Emergency Department: Payer: Medicare Other

## 2017-07-16 DIAGNOSIS — I872 Venous insufficiency (chronic) (peripheral): Secondary | ICD-10-CM | POA: Diagnosis not present

## 2017-07-16 DIAGNOSIS — I4510 Unspecified right bundle-branch block: Principal | ICD-10-CM | POA: Insufficient documentation

## 2017-07-16 DIAGNOSIS — I252 Old myocardial infarction: Secondary | ICD-10-CM | POA: Diagnosis not present

## 2017-07-16 DIAGNOSIS — I491 Atrial premature depolarization: Secondary | ICD-10-CM | POA: Diagnosis not present

## 2017-07-16 DIAGNOSIS — R9431 Abnormal electrocardiogram [ECG] [EKG]: Secondary | ICD-10-CM | POA: Diagnosis not present

## 2017-07-16 DIAGNOSIS — Z9049 Acquired absence of other specified parts of digestive tract: Secondary | ICD-10-CM | POA: Insufficient documentation

## 2017-07-16 DIAGNOSIS — N179 Acute kidney failure, unspecified: Secondary | ICD-10-CM | POA: Diagnosis not present

## 2017-07-16 DIAGNOSIS — D696 Thrombocytopenia, unspecified: Secondary | ICD-10-CM | POA: Insufficient documentation

## 2017-07-16 DIAGNOSIS — Z981 Arthrodesis status: Secondary | ICD-10-CM | POA: Insufficient documentation

## 2017-07-16 DIAGNOSIS — Z8041 Family history of malignant neoplasm of ovary: Secondary | ICD-10-CM | POA: Insufficient documentation

## 2017-07-16 DIAGNOSIS — G20 Parkinson's disease: Secondary | ICD-10-CM | POA: Insufficient documentation

## 2017-07-16 DIAGNOSIS — D649 Anemia, unspecified: Secondary | ICD-10-CM | POA: Insufficient documentation

## 2017-07-16 DIAGNOSIS — Z79899 Other long term (current) drug therapy: Secondary | ICD-10-CM | POA: Insufficient documentation

## 2017-07-16 DIAGNOSIS — E042 Nontoxic multinodular goiter: Secondary | ICD-10-CM | POA: Insufficient documentation

## 2017-07-16 DIAGNOSIS — I444 Left anterior fascicular block: Secondary | ICD-10-CM | POA: Insufficient documentation

## 2017-07-16 DIAGNOSIS — I340 Nonrheumatic mitral (valve) insufficiency: Secondary | ICD-10-CM | POA: Insufficient documentation

## 2017-07-16 DIAGNOSIS — I2510 Atherosclerotic heart disease of native coronary artery without angina pectoris: Secondary | ICD-10-CM | POA: Diagnosis present

## 2017-07-16 DIAGNOSIS — W19XXXA Unspecified fall, initial encounter: Secondary | ICD-10-CM | POA: Insufficient documentation

## 2017-07-16 DIAGNOSIS — Z87891 Personal history of nicotine dependence: Secondary | ICD-10-CM | POA: Insufficient documentation

## 2017-07-16 DIAGNOSIS — K4090 Unilateral inguinal hernia, without obstruction or gangrene, not specified as recurrent: Secondary | ICD-10-CM | POA: Diagnosis not present

## 2017-07-16 DIAGNOSIS — Z887 Allergy status to serum and vaccine status: Secondary | ICD-10-CM | POA: Insufficient documentation

## 2017-07-16 DIAGNOSIS — F329 Major depressive disorder, single episode, unspecified: Secondary | ICD-10-CM | POA: Insufficient documentation

## 2017-07-16 DIAGNOSIS — I10 Essential (primary) hypertension: Secondary | ICD-10-CM | POA: Diagnosis present

## 2017-07-16 DIAGNOSIS — M1990 Unspecified osteoarthritis, unspecified site: Secondary | ICD-10-CM | POA: Insufficient documentation

## 2017-07-16 DIAGNOSIS — F0390 Unspecified dementia without behavioral disturbance: Secondary | ICD-10-CM | POA: Diagnosis not present

## 2017-07-16 DIAGNOSIS — E785 Hyperlipidemia, unspecified: Secondary | ICD-10-CM | POA: Diagnosis not present

## 2017-07-16 DIAGNOSIS — R296 Repeated falls: Secondary | ICD-10-CM | POA: Diagnosis not present

## 2017-07-16 DIAGNOSIS — S0101XA Laceration without foreign body of scalp, initial encounter: Secondary | ICD-10-CM | POA: Diagnosis present

## 2017-07-16 DIAGNOSIS — M4802 Spinal stenosis, cervical region: Secondary | ICD-10-CM | POA: Insufficient documentation

## 2017-07-16 DIAGNOSIS — Z8249 Family history of ischemic heart disease and other diseases of the circulatory system: Secondary | ICD-10-CM | POA: Insufficient documentation

## 2017-07-16 DIAGNOSIS — I119 Hypertensive heart disease without heart failure: Secondary | ICD-10-CM | POA: Diagnosis not present

## 2017-07-16 DIAGNOSIS — Z8379 Family history of other diseases of the digestive system: Secondary | ICD-10-CM | POA: Insufficient documentation

## 2017-07-16 HISTORY — DX: Unspecified right bundle-branch block: I45.10

## 2017-07-16 HISTORY — DX: Nonrheumatic aortic (valve) insufficiency: I35.1

## 2017-07-16 HISTORY — DX: Personal history of other specified conditions: Z87.898

## 2017-07-16 HISTORY — DX: Nonrheumatic mitral (valve) insufficiency: I34.0

## 2017-07-16 HISTORY — DX: Other ill-defined heart diseases: I51.89

## 2017-07-16 HISTORY — DX: Personal history of other diseases of the circulatory system: Z86.79

## 2017-07-16 HISTORY — DX: Repeated falls: R29.6

## 2017-07-16 HISTORY — DX: Venous insufficiency (chronic) (peripheral): I87.2

## 2017-07-16 LAB — TROPONIN I: Troponin I: <0.03 ng/mL (ref <0.03)

## 2017-07-16 LAB — CBC WITH DIFFERENTIAL/PLATELET
Basophils Absolute: 0.1 10*3/uL (ref 0–0.1)
Basophils Relative: 1 %
Eosinophils Absolute: 0.2 10*3/uL (ref 0–0.7)
Eosinophils Relative: 4 %
HEMATOCRIT: 34.2 % — AB (ref 40.0–52.0)
HEMOGLOBIN: 11.6 g/dL — AB (ref 13.0–18.0)
LYMPHS ABS: 0.4 10*3/uL — AB (ref 1.0–3.6)
Lymphocytes Relative: 7 %
MCH: 33.9 pg (ref 26.0–34.0)
MCHC: 33.9 g/dL (ref 32.0–36.0)
MCV: 100 fL (ref 80.0–100.0)
MONOS PCT: 10 %
Monocytes Absolute: 0.6 10*3/uL (ref 0.2–1.0)
NEUTROS ABS: 4.5 10*3/uL (ref 1.4–6.5)
NEUTROS PCT: 78 %
Platelets: 76 10*3/uL — ABNORMAL LOW (ref 150–440)
RBC: 3.42 MIL/uL — ABNORMAL LOW (ref 4.40–5.90)
RDW: 15.7 % — ABNORMAL HIGH (ref 11.5–14.5)
WBC: 5.8 10*3/uL (ref 3.8–10.6)

## 2017-07-16 LAB — BASIC METABOLIC PANEL
Anion gap: 11 (ref 5–15)
BUN: 29 mg/dL — AB (ref 6–20)
CHLORIDE: 104 mmol/L (ref 101–111)
CO2: 24 mmol/L (ref 22–32)
CREATININE: 0.94 mg/dL (ref 0.61–1.24)
Calcium: 8.9 mg/dL (ref 8.9–10.3)
GFR calc Af Amer: >60 mL/min (ref >60)
GFR calc non Af Amer: >60 mL/min (ref >60)
GLUCOSE: 114 mg/dL — AB (ref 65–99)
Potassium: 3.8 mmol/L (ref 3.5–5.1)
Sodium: 139 mmol/L (ref 135–145)

## 2017-07-16 LAB — URINALYSIS, COMPLETE (UACMP) WITH MICROSCOPIC
BACTERIA UA: NONE SEEN
BILIRUBIN URINE: NEGATIVE
Glucose, UA: NEGATIVE mg/dL
Hgb urine dipstick: NEGATIVE
Ketones, ur: 5 mg/dL — AB
Leukocytes, UA: NEGATIVE
NITRITE: NEGATIVE
Protein, ur: NEGATIVE mg/dL
SPECIFIC GRAVITY, URINE: 1.018 (ref 1.005–1.030)
pH: 6 (ref 5.0–8.0)

## 2017-07-16 MED ORDER — SODIUM CHLORIDE 0.9 % IV BOLUS (SEPSIS)
500.0000 mL | Freq: Once | INTRAVENOUS | Status: AC
  Administered 2017-07-16: 500 mL via INTRAVENOUS

## 2017-07-16 MED ORDER — LIDOCAINE-EPINEPHRINE (PF) 1 %-1:200000 IJ SOLN
INTRAMUSCULAR | Status: AC
  Filled 2017-07-16: qty 30

## 2017-07-16 NOTE — ED Triage Notes (Signed)
[facility] where patient got up to go top bathroom and was dizzy and fell. Patient states he did not have LOC. Patient did not have any nausea and feels like headed. Vitals for EMS were 180/76 HR 80 96% on RA 18 RR.

## 2017-07-16 NOTE — ED Provider Notes (Signed)
[facility] Emergency Department Provider Note  ____________________________________________   First MD Initiated Contact with Patient 07/16/17 2044     (approximate)  I have reviewed the triage vital signs and the nursing notes.   HISTORY  Chief Complaint Fall   HPI Perry [name] is a 83 y.o. male with history of Parkinson's disease as well as hypertension was presented to the emergency department today after a fall.  Per EMS, the patient had gotten up to go to the bathroom and fallen.  It was an unwitnessed fall.  However, the patient is able to remember the circumstances.  He says that he got up and felt lightheaded and then fell, hitting the back of his head.  Patient denies loss of consciousness.  Says that he is allergic to tetanus toxoid.  Says that he still feels some mild lightheadedness but no dizziness.  Patient is supposed to be using his call bell for assistance when he gets up to the bathroom.  Family is reporting that he has had 5 falls over the past several weeks.  Patient denies any neck pain.  Denies any back or hip pain.   Past Medical History:  Diagnosis Date  . Arthritis   . Heart attack (HCC)   . Hyperlipidemia   . Hypertension   . Parkinson's disease (HCC)    lower extremity   . S/P TURP   . Urinary incontinence     Patient Active Problem List   Diagnosis Date Noted  . CAD (coronary artery disease) 06/14/2017  . AKI (acute kidney injury) (HCC) 06/14/2017  . Unresponsiveness 06/14/2017  . Thrombocytopenia (HCC) 06/14/2017  . Normocytic anemia 06/14/2017  . Depression 06/14/2017  . Parkinson's disease (HCC)   . Hypertension   . Hyperlipidemia   . Bradycardia 05/17/2016  . Benign hypertensive heart disease without heart failure 12/23/2012  . Dyspnea on exertion 08/25/2012  . Pedal edema 08/25/2012  . Heart murmur 08/25/2012  . Right inguinal hernia 02/28/2011    Past Surgical History:  Procedure Laterality Date  .  CHOLECYSTECTOMY    . HERNIA REPAIR    . HIP FUSION     lt  . KNEE SURGERY     rt knee  . PROSTATE SURGERY    . TONSILLECTOMY    . WRIST SURGERY      Prior to Admission medications   Medication Sig Start Date End Date Taking? Authorizing Provider  benazepril (LOTENSIN) 20 MG tablet Take 20 mg by mouth 2 (two) times daily.     Yes [provider]  calcium-vitamin D (OSCAL WITH D) 500-200 MG-UNIT per tablet Take 1 tablet by mouth daily.     Yes [provider]  carbidopa-levodopa (SINEMET IR) 25-100 MG tablet Take 2 tablets by mouth 3 (three) times daily.  01/19/16 06/14/21 Yes [provider]  Cholecalciferol (VITAMIN D) 2000 UNITS CAPS Take 2,000 Units by mouth daily.    Yes [provider]  Coenzyme Q10 (CO Q-10) 100 MG CAPS Take 100 mg by mouth daily.    Yes [provider]  cyanocobalamin (V-R VITAMIN B-12) 500 MCG tablet Take 500 mcg by mouth daily.    Yes [provider]  donepezil (ARICEPT) 10 MG tablet Take 10 mg by mouth at bedtime. 12/23/16 06/14/21 Yes [provider]  doxazosin (CARDURA) 8 MG tablet Take 1 tablet (8 mg total) by mouth 2 (two) times daily. 12/31/16  Yes Antonieta Iba, MD  fish oil-omega-3 fatty acids 1000 MG  capsule Take 2 g by mouth daily.     Yes [provider]  fluticasone (FLONASE) 50 MCG/ACT nasal spray Place 2 sprays into both nostrils daily.   Yes [provider]  HYDROcodone-acetaminophen (NORCO/VICODIN) 5-325 MG tablet Take 1 tablet by mouth every 4 (four) hours as needed for moderate pain. 11/29/15  Yes Cuthriell, Delorise RoyalsJonathan D, PA-C  magnesium oxide (MAG-OX) 400 MG tablet Take 400 mg by mouth daily.   Yes [provider]  megestrol (MEGACE) 20 MG tablet Take 20 mg by mouth daily.   Yes [provider]  mirabegron ER (MYRBETRIQ) 50 MG TB24 Take 100 mg by mouth daily.    Yes [provider]  Polyethyl Glyc-Propyl Glyc PF 0.4-0.3 % SOLN Apply 1 drop to  eye daily.   Yes [provider]  polyethylene glycol (MIRALAX / GLYCOLAX) packet Take 17 g by mouth daily.   Yes [provider]  potassium chloride SA (K-DUR,KLOR-CON) 20 MEQ tablet Take 20 mEq by mouth 2 (two) times daily.   Yes [provider]  sertraline (ZOLOFT) 50 MG tablet Take 150 mg by mouth daily.  01/19/16 06/14/21 Yes [provider]  tadalafil (CIALIS) 20 MG tablet Take 20 mg by mouth daily as needed. (erectile dysfunction) 01/02/15  Yes [provider]  timolol (TIMOPTIC) 0.5 % ophthalmic solution Place 1 drop into the left eye 2 (two) times daily.    Yes [provider]  traZODone (DESYREL) 50 MG tablet Take 25 mg by mouth at bedtime as needed for sleep.   Yes [provider]    Allergies Tetanus toxoids; Atorvastatin; and Rosuvastatin  Family History  Problem Relation Age of Onset  . Healthy Mother   . Hypertension Father   . Ovarian cancer Sister   . Appendicitis Brother   . Hypertension Sister   . Heart disease Sister     Social History Social History   Tobacco Use  . Smoking status: Former Smoker    Types: Cigars  . Smokeless tobacco: Never Used  Substance Use Topics  . Alcohol use: No    Alcohol/week: 0.0 oz  . Drug use: Not on file    Review of Systems  Constitutional: No fever/chills Eyes: No visual changes. ENT: No sore throat. Cardiovascular: Denies chest pain. Respiratory: Denies shortness of breath. Gastrointestinal: No abdominal pain.  No nausea, no vomiting.  No diarrhea.  No constipation. Genitourinary: Negative for dysuria. Musculoskeletal: Negative for back pain. Skin: Negative for rash. Neurological: Negative for focal weakness or numbness.   ____________________________________________   PHYSICAL EXAM:  VITAL SIGNS: ED Triage Vitals  Enc Vitals Group     BP 07/16/17 2017 (!) 173/84     Pulse Rate 07/16/17 2017 74     Resp 07/16/17 2100 19     Temp 07/16/17 2017 98  F (36.7 C)     Temp Source 07/16/17 2017 Oral     SpO2 07/16/17 2017 97 %     Weight --      Height 07/16/17 2014 5\' 8"  (1.727 m)     Head Circumference --      Peak Flow --      Pain Score 07/16/17 2014 0     Pain Loc --      Pain Edu? --      Excl. in GC? --     Constitutional: Alert and oriented. Well appearing and in no acute distress. Eyes: Conjunctivae are normal.  Head: 3 cm laceration which is curvilinear  to the left side of the occiput posteriorly.  Down to the fascial layers but I do not see galea.  No active bleeding. Nose: No congestion/rhinnorhea. Mouth/Throat: Mucous membranes are moist.  Neck: No stridor.  No tenderness to the midline cervical spine.  Able to remove the c-collar and patient able to range his head freely without any pain or restriction. Cardiovascular: Normal rate, regular rhythm. Grossly normal heart sounds.   Respiratory: Normal respiratory effort.  No retractions. Lungs CTAB. Gastrointestinal: Soft and nontender. No distention.  Musculoskeletal: No lower extremity tenderness nor edema.  No joint effusions.  5 out of 5 strength bilateral lower extremity's.  Hips are stable and nontender. Neurologic:  Normal speech and language. No gross focal neurologic deficits are appreciated. Skin:  Skin is warm, dry and intact. No rash noted. Psychiatric: Mood and affect are normal. Speech and behavior are normal.  ____________________________________________   LABS (all labs ordered are listed, but only abnormal results are displayed)  Labs Reviewed  CBC WITH DIFFERENTIAL/PLATELET - Abnormal; Notable for the following components:      Result Value   RBC 3.42 (*)    Hemoglobin 11.6 (*)    HCT 34.2 (*)    RDW 15.7 (*)    Platelets 76 (*)    Lymphs Abs 0.4 (*)    All other components within normal limits  BASIC METABOLIC PANEL - Abnormal; Notable for the following components:   Glucose, Bld 114 (*)    BUN 29 (*)    All other components within normal  limits  URINALYSIS, COMPLETE (UACMP) WITH MICROSCOPIC - Abnormal; Notable for the following components:   Color, Urine YELLOW (*)    APPearance CLEAR (*)    Ketones, ur 5 (*)    Squamous Epithelial / LPF 0-5 (*)    All other components within normal limits  TROPONIN I  TROPONIN I   ____________________________________________  EKG  ED ECG REPORT I, Arelia Longestavid M Kamdyn Colborn, the attending physician, personally viewed and interpreted this ECG.   Date: 07/16/2017  EKG Time: 2015  Rate: 76  Rhythm: normal sinus rhythm  Axis: Normal  Intervals:right bundle branch block  ST&T Change: T wave inversions in lead V3 as well as aVF and 3 with minimal depressions in V3. Inversions appear new from previous.  Right bundle branch block is also new.  ED ECG REPORT I, Arelia Longestavid M Jiyah Torpey, the attending physician, personally viewed and interpreted this ECG.   Date: 07/16/2017  EKG Time: 2153  Rate: 75  Rhythm: normal sinus rhythm  Axis: Normal  Intervals:right bundle branch block  ST&T Change: T wave inversions in 3 as well as V3 with persistent depression in V3.  ____________________________________________  RADIOLOGY  No acute finding on the CT of the head neck ____________________________________________   PROCEDURES  Procedure(s) performed:   Marland Kitchen.Marland Kitchen.Laceration Repair Date/Time: 07/16/2017 9:31 PM Performed by: Myrna BlazerSchaevitz, Arwen Haseley Matthew, MD Authorized by: Myrna BlazerSchaevitz, Esther Bradstreet Matthew, MD   Consent:    Consent obtained:  Verbal   Consent given by:  Patient   Risks discussed:  Infection and pain   Alternatives discussed:  No treatment Anesthesia (see MAR for exact dosages):    Anesthesia method:  Local infiltration   Local anesthetic:  Lidocaine 1% WITH epi Laceration details:    Location:  Scalp   Scalp location:  Occipital   Length (cm):  3   Depth (mm):  3 Repair type:    Repair type:  Simple Pre-procedure details:    Preparation:  Patient was  prepped and draped in usual sterile  fashion and imaging obtained to evaluate for foreign bodies Exploration:    Hemostasis achieved with:  Epinephrine   Wound exploration: wound explored through full range of motion and entire depth of wound probed and visualized     Contaminated: no   Treatment:    Area cleansed with:  Saline   Amount of cleaning:  Extensive   Irrigation solution:  Sterile saline   Irrigation volume:  250   Irrigation method:  Pressure wash   Visualized foreign bodies/material removed: no   Skin repair:    Repair method:  Staples Approximation:    Approximation:  Close   Vermilion border: well-aligned   Post-procedure details:    Dressing:  Open (no dressing)   Patient tolerance of procedure:  Tolerated well, no immediate complications    Critical Care performed:   ____________________________________________   INITIAL IMPRESSION / ASSESSMENT AND PLAN / ED COURSE  Pertinent labs & imaging results that were available during my care of the patient were reviewed by me and considered in my medical decision making (see chart for details).  DDX: Arrhythmia, ACS, concussion, skull fracture, intracranial hemorrhage, UTI, anemia, electrolyte abnormality, failure  As part of my medical decision making, I reviewed the following data within the electronic MEDICAL RECORD NUMBER Old EKG reviewed and Notes from prior ED visits  ----------------------------------------- 10:42 PM on 07/16/2017 -----------------------------------------  Patient no longer with lightheadedness but with EKGs both with new right bundle branch block as well as new T wave inversions.  Patient will be admitted to the hospital for further observation.  Signed out to Dr. Anne Hahn.  The family and the patient aware of the EKG changes and the need for further observation and treatment.      ____________________________________________   FINAL CLINICAL IMPRESSION(S) / ED DIAGNOSES  Fall.  Lightheadedness.  New onset right bundle branch  block.  Occipital laceration    NEW MEDICATIONS STARTED DURING THIS VISIT:  This SmartLink is deprecated. Use AVSMEDLIST instead to display the medication list for a patient.   Note:  This document was prepared using Dragon voice recognition software and may include unintentional dictation errors.     Myrna Blazer, MD 07/16/17 [phone]

## 2017-07-16 NOTE — H&P (Signed)
[facility] [place] [facility]   PATIENT NAME: Perry [name]    MR#:  [id number]  DATE OF BIRTH:  10/04/1928  DATE OF ADMISSION:  07/16/2017  PRIMARY CARE PHYSICIAN: [name], Bert J III, MD   REQUESTING/REFERRING PHYSICIAN: Pershing ProudSchaevitz, MD  CHIEF COMPLAINT:   Chief Complaint  Patient presents with  . Fall    HISTORY OF PRESENT ILLNESS:  Perry [name]  is a 83 y.o. male who presents with more frequent falls at home over the last month or so.  Patient fell tonight he was brought to the ED for evaluation.  Here he was found to have a new right bundle branch block and T wave inversions on his EKG.  Patient does have Parkinson's, and it was felt that he was perhaps falling due to the same.  He was hospitalized about a month ago after an episode of diaphoresis and syncope at home, however his EKG changes were not present at that time.  Hospitalist were called for admission and further evaluation  PAST MEDICAL HISTORY:   Past Medical History:  Diagnosis Date  . Arthritis   . Heart attack (HCC)   . Hyperlipidemia   . Hypertension   . Parkinson's disease (HCC)    lower extremity   . S/P TURP   . Urinary incontinence     PAST SURGICAL HISTORY:   Past Surgical History:  Procedure Laterality Date  . CHOLECYSTECTOMY    . HERNIA REPAIR    . HIP FUSION     lt  . KNEE SURGERY     rt knee  . PROSTATE SURGERY    . TONSILLECTOMY    . WRIST SURGERY      SOCIAL HISTORY:   Social History   Tobacco Use  . Smoking status: Former Smoker    Types: Cigars  . Smokeless tobacco: Never Used  Substance Use Topics  . Alcohol use: No    Alcohol/week: 0.0 oz    FAMILY HISTORY:   Family History  Problem Relation Age of Onset  . Healthy Mother   . Hypertension Father   . Ovarian cancer Sister   . Appendicitis Brother   . Hypertension Sister   . Heart disease Sister     DRUG ALLERGIES:   Allergies  Allergen Reactions  . Tetanus Toxoids Other (See  Comments)    REACTION: " 50 CENT PIECE SIZE WELTS AND CLOSES MY EYES"  . Atorvastatin Other (See Comments)    REACTION: " MUSCLE WEAKNESS"  . Rosuvastatin Other (See Comments)    REACTION: " MUSCLE WEAKNESS"    MEDICATIONS AT HOME:   Prior to Admission medications   Medication Sig Start Date End Date Taking? Authorizing Provider  benazepril (LOTENSIN) 20 MG tablet Take 20 mg by mouth 2 (two) times daily.     Yes [provider]  calcium-vitamin D (OSCAL WITH D) 500-200 MG-UNIT per tablet Take 1 tablet by mouth daily.     Yes [provider]  carbidopa-levodopa (SINEMET IR) 25-100 MG tablet Take 2 tablets by mouth 3 (three) times daily.  01/19/16 06/14/21 Yes [provider]  Cholecalciferol (VITAMIN D) 2000 UNITS CAPS Take 2,000 Units by mouth daily.    Yes [provider]  Coenzyme Q10 (CO Q-10) 100 MG CAPS Take 100 mg by mouth daily.    Yes [provider]  cyanocobalamin (V-R VITAMIN B-12) 500 MCG tablet Take 500 mcg by mouth daily.    Yes [provider]  donepezil (ARICEPT)  10 MG tablet Take 10 mg by mouth at bedtime. 12/23/16 06/14/21 Yes [provider]  doxazosin (CARDURA) 8 MG tablet Take 1 tablet (8 mg total) by mouth 2 (two) times daily. 12/31/16  Yes Gollan, Tollie Pizza, MD  fish oil-omega-3 fatty acids 1000 MG capsule Take 2 g by mouth daily.     Yes [provider]  fluticasone (FLONASE) 50 MCG/ACT nasal spray Place 2 sprays into both nostrils daily.   Yes [provider]  HYDROcodone-acetaminophen (NORCO/VICODIN) 5-325 MG tablet Take 1 tablet by mouth every 4 (four) hours as needed for moderate pain. 11/29/15  Yes Cuthriell, Delorise Royals, PA-C  magnesium oxide (MAG-OX) 400 MG tablet Take 400 mg by mouth daily.   Yes [provider]  megestrol (MEGACE) 20 MG tablet Take 20 mg by mouth daily.   Yes [provider]  mirabegron ER (MYRBETRIQ) 50 MG TB24 Take 100 mg by mouth daily.    Yes  [provider]  Polyethyl Glyc-Propyl Glyc PF 0.4-0.3 % SOLN Apply 1 drop to eye daily.   Yes [provider]  polyethylene glycol (MIRALAX / GLYCOLAX) packet Take 17 g by mouth daily.   Yes [provider]  potassium chloride SA (K-DUR,KLOR-CON) 20 MEQ tablet Take 20 mEq by mouth 2 (two) times daily.   Yes [provider]  sertraline (ZOLOFT) 50 MG tablet Take 150 mg by mouth daily.  01/19/16 06/14/21 Yes [provider]  tadalafil (CIALIS) 20 MG tablet Take 20 mg by mouth daily as needed. (erectile dysfunction) 01/02/15  Yes [provider]  timolol (TIMOPTIC) 0.5 % ophthalmic solution Place 1 drop into the left eye 2 (two) times daily.    Yes [provider]  traZODone (DESYREL) 50 MG tablet Take 25 mg by mouth at bedtime as needed for sleep.   Yes [provider]    REVIEW OF SYSTEMS:  Review of Systems  Constitutional: Negative for chills, fever, malaise/fatigue and weight loss.  HENT: Negative for ear pain, hearing loss and tinnitus.   Eyes: Negative for blurred vision, double vision, pain and redness.  Respiratory: Negative for cough, hemoptysis and shortness of breath.   Cardiovascular: Negative for chest pain, palpitations, orthopnea and leg swelling.  Gastrointestinal: Negative for abdominal pain, constipation, diarrhea, nausea and vomiting.  Genitourinary: Negative for dysuria, frequency and hematuria.  Musculoskeletal: Positive for falls. Negative for back pain, joint pain and neck pain.  Skin:       No acne, rash, or lesions  Neurological: Positive for weakness. Negative for dizziness, tremors and focal weakness.  Endo/Heme/Allergies: Negative for polydipsia. Does not bruise/bleed easily.  Psychiatric/Behavioral: Negative for depression. The patient is not nervous/anxious and does not have insomnia.      VITAL SIGNS:   Vitals:   07/16/17 2014 07/16/17 2017 07/16/17 2100  BP:  (!) 173/84 (!) 175/77   Pulse:  74 77  Resp:   19  Temp:  98 F (36.7 C)   TempSrc:  Oral   SpO2:  97% 99%  Height: 5\' 8"  (1.727 m)     Wt Readings from Last 3 Encounters:  06/15/17 71.8 kg (158 lb 4.6 oz)  06/06/17 72.1 kg (159 lb)  12/31/16 76.8 kg (169 lb 4 oz)    PHYSICAL EXAMINATION:  Physical Exam  Vitals reviewed. Constitutional: He is oriented to person, place, and time. He appears well-developed and well-nourished. No distress.  HENT:  Head: Normocephalic.  Mouth/Throat: Oropharynx is clear and moist.  Soft tissue injury  on the back of his scalp where he fell  Eyes: Conjunctivae and EOM are normal. Pupils are equal, round, and reactive to light. No scleral icterus.  Neck: Normal range of motion. Neck supple. No JVD present. No thyromegaly present.  Cardiovascular: Normal rate, regular rhythm and intact distal pulses. Exam reveals no gallop and no friction rub.  No murmur heard. Respiratory: Effort normal and breath sounds normal. No respiratory distress. He has no wheezes. He has no rales.  GI: Soft. Bowel sounds are normal. He exhibits no distension. There is no tenderness.  Musculoskeletal: Normal range of motion. He exhibits no edema.  No arthritis, no gout  Lymphadenopathy:    He has no cervical adenopathy.  Neurological: He is alert and oriented to person, place, and time. No cranial nerve deficit.  No dysarthria, no aphasia  Skin: Skin is warm and dry. No rash noted. No erythema.  Psychiatric: He has a normal mood and affect. His behavior is normal. Judgment and thought content normal.    LABORATORY PANEL:   CBC Recent Labs  Lab 07/16/17 2015  WBC 5.8  HGB 11.6*  HCT 34.2*  PLT 76*   ------------------------------------------------------------------------------------------------------------------  Chemistries  Recent Labs  Lab 07/16/17 2015  NA 139  K 3.8  CL 104  CO2 24  GLUCOSE 114*  BUN 29*  CREATININE 0.94  CALCIUM 8.9    ------------------------------------------------------------------------------------------------------------------  Cardiac Enzymes Recent Labs  Lab 07/16/17 2015  TROPONINI <0.03   ------------------------------------------------------------------------------------------------------------------  RADIOLOGY:  Ct Head Wo Contrast  Result Date: 07/16/2017 CLINICAL DATA:  Fall well going to the commode. No loss of consciousness. Lightheadedness without nausea. EXAM: CT HEAD WITHOUT CONTRAST CT CERVICAL SPINE WITHOUT CONTRAST TECHNIQUE: Multidetector CT imaging of the head and cervical spine was performed following the standard protocol without intravenous contrast. Multiplanar CT image reconstructions of the cervical spine were also generated. COMPARISON:  CT head without contrast 06/06/2017. MRI brain 06/15/2017. FINDINGS: CT HEAD FINDINGS Brain: Advanced atrophy and white matter disease is again noted. Ventricles are moderately dilated without significant interval change. No acute cortical infarct is present. The basal ganglia are intact. Insular ribbon is normal. Remote lacunar infarcts are again noted within the left thalamus and right external capsule. The brainstem and cerebellum are normal. Vascular: Atherosclerotic calcifications are present at the dural margin of the right vertebral artery and in both cavernous internal carotid arteries without a hyperdense vessel. Skull: A left scalp laceration and hematoma is present without underlying fracture. There is no acute or healing fracture. Calvarium is intact. No focal lytic or blastic lesions are present. Sinuses/Orbits: The paranasal sinuses and mastoid air cells are clear. Bilateral lens replacements are present. Globes and orbits are otherwise within normal limits bilaterally. CT CERVICAL SPINE FINDINGS Alignment: AP alignment is anatomic. Mild leftward curvature is present. Skull base and vertebrae: The craniocervical junction is within normal  limits. Degenerative changes are present at C1-2. Soft tissues and spinal canal: Atherosclerotic calcifications are present bilaterally. Stenosis is more likely on the right. A heterogeneous enlarged thyroid is compatible with a multinodular goiter. No significant cervical adenopathy is present. Disc levels: There is fusion across the disc space at C3-4. Uncovertebral spurring contributes mild left foraminal narrowing. Severe right moderate left foraminal stenosis is present at C4-5. Moderate foraminal narrowing is worse on left at C5-6. Severe left and moderate right foraminal narrowing is present C6-7. Advanced facet hypertrophy is present at C7-T1. Upper chest: The lung apices are clear. IMPRESSION: 1. Left whole scalp laceration and hematoma  without underlying fracture. 2. Stable atrophy and white matter disease. 3. Prominent ventricles. Normal pressure hydrocephalus is also considered. 4. No acute intracranial abnormality or significant interval change. 5. Multilevel degenerative changes in the cervical spine without acute fracture or traumatic subluxation. 6. Multinodular goiter. 7. Carotid bifurcation atherosclerosis with stenosis more likely on the right. Electronically Signed   By: Marin Roberts M.D.   On: 07/16/2017 20:56   Ct Cervical Spine Wo Contrast  Result Date: 07/16/2017 CLINICAL DATA:  Fall well going to the commode. No loss of consciousness. Lightheadedness without nausea. EXAM: CT HEAD WITHOUT CONTRAST CT CERVICAL SPINE WITHOUT CONTRAST TECHNIQUE: Multidetector CT imaging of the head and cervical spine was performed following the standard protocol without intravenous contrast. Multiplanar CT image reconstructions of the cervical spine were also generated. COMPARISON:  CT head without contrast 06/06/2017. MRI brain 06/15/2017. FINDINGS: CT HEAD FINDINGS Brain: Advanced atrophy and white matter disease is again noted. Ventricles are moderately dilated without significant interval change.  No acute cortical infarct is present. The basal ganglia are intact. Insular ribbon is normal. Remote lacunar infarcts are again noted within the left thalamus and right external capsule. The brainstem and cerebellum are normal. Vascular: Atherosclerotic calcifications are present at the dural margin of the right vertebral artery and in both cavernous internal carotid arteries without a hyperdense vessel. Skull: A left scalp laceration and hematoma is present without underlying fracture. There is no acute or healing fracture. Calvarium is intact. No focal lytic or blastic lesions are present. Sinuses/Orbits: The paranasal sinuses and mastoid air cells are clear. Bilateral lens replacements are present. Globes and orbits are otherwise within normal limits bilaterally. CT CERVICAL SPINE FINDINGS Alignment: AP alignment is anatomic. Mild leftward curvature is present. Skull base and vertebrae: The craniocervical junction is within normal limits. Degenerative changes are present at C1-2. Soft tissues and spinal canal: Atherosclerotic calcifications are present bilaterally. Stenosis is more likely on the right. A heterogeneous enlarged thyroid is compatible with a multinodular goiter. No significant cervical adenopathy is present. Disc levels: There is fusion across the disc space at C3-4. Uncovertebral spurring contributes mild left foraminal narrowing. Severe right moderate left foraminal stenosis is present at C4-5. Moderate foraminal narrowing is worse on left at C5-6. Severe left and moderate right foraminal narrowing is present C6-7. Advanced facet hypertrophy is present at C7-T1. Upper chest: The lung apices are clear. IMPRESSION: 1. Left whole scalp laceration and hematoma without underlying fracture. 2. Stable atrophy and white matter disease. 3. Prominent ventricles. Normal pressure hydrocephalus is also considered. 4. No acute intracranial abnormality or significant interval change. 5. Multilevel degenerative  changes in the cervical spine without acute fracture or traumatic subluxation. 6. Multinodular goiter. 7. Carotid bifurcation atherosclerosis with stenosis more likely on the right. Electronically Signed   By: Marin Roberts M.D.   On: 07/16/2017 20:56    EKG:   Orders placed or performed during the hospital encounter of 07/16/17  . ED EKG  . ED EKG  . EKG 12-Lead  . EKG 12-Lead    IMPRESSION AND PLAN:  Principal Problem:   Recurrent falls -unclear etiology, though there is the possibility of a cardiac component possible arrhythmia or some such given his changes in his EKG.  We will trend his cardiac enzymes tonight, though I suspect they will likely not rise.  We will get an echocardiogram and a cardiology consult Active Problems:   CAD (coronary artery disease) -continue home meds, other workup as above   New onset  right bundle branch block (RBBB) -workup as above   Hypertension -continue home meds   Parkinson's disease (HCC) -home dose Sinemet  All the records are reviewed and case discussed with ED provider. Management plans discussed with the patient and/or family.  DVT PROPHYLAXIS: SubQ lovenox  GI PROPHYLAXIS: None  ADMISSION STATUS: Observation  CODE STATUS: Full Code Status History    Date Active Date Inactive Code Status Order ID Comments User Context   06/14/2017 23:32 06/16/2017 21:44 Full Code [id number]  Lorretta Harp, MD ED      TOTAL TIME TAKING CARE OF THIS PATIENT: 40 minutes.   Anne Hahn, Ardelia Wrede FIELDING 07/16/2017, 10:50 PM  [employer]  [phone]  CC: Primary care physician; Lynnea Ferrier, MD  Note:  This document was prepared using Dragon voice recognition software and may include unintentional dictation errors.

## 2017-07-17 ENCOUNTER — Other Ambulatory Visit: Payer: Self-pay

## 2017-07-17 DIAGNOSIS — R296 Repeated falls: Secondary | ICD-10-CM

## 2017-07-17 DIAGNOSIS — I4510 Unspecified right bundle-branch block: Principal | ICD-10-CM

## 2017-07-17 LAB — MRSA PCR SCREENING: MRSA by PCR: NEGATIVE

## 2017-07-17 LAB — CBC
HCT: 31.6 % — ABNORMAL LOW (ref 40.0–52.0)
HCT: 31.2 % — ABNORMAL LOW (ref 40.0–52.0)
Hemoglobin: 10.6 g/dL — ABNORMAL LOW (ref 13.0–18.0)
Hemoglobin: 10.7 g/dL — ABNORMAL LOW (ref 13.0–18.0)
MCH: 33.5 pg (ref 26.0–34.0)
MCH: 34.4 pg — ABNORMAL HIGH (ref 26.0–34.0)
MCHC: 33.5 g/dL (ref 32.0–36.0)
MCHC: 34.2 g/dL (ref 32.0–36.0)
MCV: 100.2 fL — ABNORMAL HIGH (ref 80.0–100.0)
MCV: 100.5 fL — ABNORMAL HIGH (ref 80.0–100.0)
Platelets: 60 K/uL — ABNORMAL LOW (ref 150–440)
Platelets: 61 K/uL — ABNORMAL LOW (ref 150–440)
RBC: 3.16 MIL/uL — ABNORMAL LOW (ref 4.40–5.90)
RBC: 3.11 MIL/uL — ABNORMAL LOW (ref 4.40–5.90)
RDW: 15.6 % — ABNORMAL HIGH (ref 11.5–14.5)
RDW: 15.3 % — ABNORMAL HIGH (ref 11.5–14.5)
WBC: 8.1 K/uL (ref 3.8–10.6)
WBC: 7.2 K/uL (ref 3.8–10.6)

## 2017-07-17 LAB — BASIC METABOLIC PANEL WITH GFR
Anion gap: 8 (ref 5–15)
BUN: 27 mg/dL — ABNORMAL HIGH (ref 6–20)
CO2: 24 mmol/L (ref 22–32)
Calcium: 8.4 mg/dL — ABNORMAL LOW (ref 8.9–10.3)
Chloride: 105 mmol/L (ref 101–111)
Creatinine, Ser: 0.88 mg/dL (ref 0.61–1.24)
GFR calc Af Amer: >60 mL/min
GFR calc non Af Amer: >60 mL/min
Glucose, Bld: 121 mg/dL — ABNORMAL HIGH (ref 65–99)
Potassium: 3.7 mmol/L (ref 3.5–5.1)
Sodium: 137 mmol/L (ref 135–145)

## 2017-07-17 LAB — TROPONIN I: Troponin I: 0.03 ng/mL

## 2017-07-17 LAB — TSH: TSH: 0.114 u[IU]/mL — AB (ref 0.350–4.500)

## 2017-07-17 MED ORDER — DOXAZOSIN MESYLATE 8 MG PO TABS
8.0000 mg | ORAL_TABLET | Freq: Two times a day (BID) | ORAL | Status: DC
  Administered 2017-07-17 (×2): 8 mg via ORAL
  Filled 2017-07-17 (×3): qty 1

## 2017-07-17 MED ORDER — ONDANSETRON HCL 4 MG PO TABS: 4.0000 mg | ORAL_TABLET | Freq: Four times a day (QID) | ORAL | Status: DC | PRN

## 2017-07-17 MED ORDER — DONEPEZIL HCL 10 MG PO TABS
10.0000 mg | ORAL_TABLET | Freq: Every day | ORAL | Status: DC
  Administered 2017-07-17: 10 mg via ORAL
  Filled 2017-07-17 (×2): qty 1

## 2017-07-17 MED ORDER — ONDANSETRON HCL 4 MG/2ML IJ SOLN: 4.0000 mg | Freq: Four times a day (QID) | INTRAMUSCULAR | Status: DC | PRN

## 2017-07-17 MED ORDER — AMLODIPINE BESYLATE 5 MG PO TABS
5.0000 mg | ORAL_TABLET | Freq: Every day | ORAL | Status: DC
  Administered 2017-07-17: 5 mg via ORAL
  Filled 2017-07-17: qty 1

## 2017-07-17 MED ORDER — CARBIDOPA-LEVODOPA 25-100 MG PO TABS
2.0000 | ORAL_TABLET | Freq: Three times a day (TID) | ORAL | Status: DC
  Administered 2017-07-17: 2 via ORAL
  Filled 2017-07-17 (×3): qty 2

## 2017-07-17 MED ORDER — SERTRALINE HCL 50 MG PO TABS
150.0000 mg | ORAL_TABLET | Freq: Every day | ORAL | Status: DC
  Administered 2017-07-17: 150 mg via ORAL
  Filled 2017-07-17: qty 3

## 2017-07-17 MED ORDER — ACETAMINOPHEN 325 MG PO TABS: 650.0000 mg | ORAL_TABLET | Freq: Four times a day (QID) | ORAL | Status: DC | PRN

## 2017-07-17 MED ORDER — TRAZODONE HCL 50 MG PO TABS: 25.0000 mg | ORAL_TABLET | Freq: Every evening | ORAL | Status: DC | PRN

## 2017-07-17 MED ORDER — ACETAMINOPHEN 650 MG RE SUPP: 650.0000 mg | Freq: Four times a day (QID) | RECTAL | Status: DC | PRN

## 2017-07-17 MED ORDER — ENOXAPARIN SODIUM 40 MG/0.4ML ~~LOC~~ SOLN
40.0000 mg | SUBCUTANEOUS | Status: DC
  Administered 2017-07-17: 40 mg via SUBCUTANEOUS
  Filled 2017-07-17: qty 0.4

## 2017-07-17 MED ORDER — MIRABEGRON ER 50 MG PO TB24
100.0000 mg | ORAL_TABLET | Freq: Every day | ORAL | Status: DC
  Administered 2017-07-17: 100 mg via ORAL
  Filled 2017-07-17: qty 2

## 2017-07-17 MED ORDER — TIMOLOL MALEATE 0.5 % OP SOLN
1.0000 [drp] | Freq: Two times a day (BID) | OPHTHALMIC | Status: DC
  Administered 2017-07-17 (×2): 1 [drp] via OPHTHALMIC
  Filled 2017-07-17: qty 5

## 2017-07-17 MED ORDER — BENAZEPRIL HCL 20 MG PO TABS
20.0000 mg | ORAL_TABLET | Freq: Two times a day (BID) | ORAL | Status: DC
  Administered 2017-07-17 (×2): 20 mg via ORAL
  Filled 2017-07-17 (×3): qty 1

## 2017-07-17 MED ORDER — HYDROCODONE-ACETAMINOPHEN 5-325 MG PO TABS
1.0000 | ORAL_TABLET | ORAL | Status: DC | PRN
Start: 2017-07-17 — End: 2017-07-17
  Administered 2017-07-17: 1 via ORAL
  Filled 2017-07-17: qty 1

## 2017-07-17 NOTE — Progress Notes (Signed)
[employer] [facility] [place] [facility]   PATIENT NAME: Perry MylarLacy [name]    MR#:  [id number]  DATE OF BIRTH:  01/31/1929  SUBJECTIVE:   Patient here with fall and abnormal EKG no acute events on tele  REVIEW OF SYSTEMS:    Review of Systems  Constitutional: Negative for fever, chills weight loss HENT: Negative for ear pain, nosebleeds, congestion, facial swelling, rhinorrhea, neck pain, neck stiffness and ear discharge.   Respiratory: Negative for cough, shortness of breath, wheezing  Cardiovascular: Negative for chest pain, palpitations and leg swelling.  Gastrointestinal: Negative for heartburn, abdominal pain, vomiting, diarrhea or consitpation Genitourinary: Negative for dysuria, urgency, frequency, hematuria Musculoskeletal: Negative for back pain or joint pain  + parkinson's  Neurological: Negative for dizziness, seizures, syncope, focal weakness,  numbness and headaches.  + falls Hematological: Does  bruise/bleed easily.  Psychiatric/Behavioral: Negative for hallucinations, confusion, dysphoric mood    Tolerating Diet: yes      DRUG ALLERGIES:   Allergies  Allergen Reactions  . Tetanus Toxoids Other (See Comments)    REACTION: " 50 CENT PIECE SIZE WELTS AND CLOSES MY EYES"  . Atorvastatin Other (See Comments)    REACTION: " MUSCLE WEAKNESS"  . Rosuvastatin Other (See Comments)    REACTION: " MUSCLE WEAKNESS"    VITALS:  Blood pressure (!) 183/67, pulse 61, temperature 99.1 F (37.3 C), temperature source Oral, resp. rate 17, height 5\' 8"  (1.727 m), weight 87.5 kg (193 lb), SpO2 98 %.  PHYSICAL EXAMINATION:  Constitutional: Appears well-developed and well-nourished. No distress. HENT: Normocephalic. Marland Kitchen. Oropharynx is clear and moist.  Eyes: Conjunctivae and EOM are normal. PERRLA, no scleral icterus.  Neck: Normal ROM. Neck supple. No JVD. No tracheal deviation. CVS: RRR, S1/S2 +, no murmurs, no gallops, no carotid bruit.  Pulmonary: Effort and  breath sounds normal, no stridor, rhonchi, wheezes, rales.  Abdominal: Soft. BS +,  no distension, tenderness, rebound or guarding.  Musculoskeletal: Normal range of motion. No edema and no tenderness.  Neuro: Alert. CN 2-12 grossly intact. No focal deficits. Skin: Skin is warm and dry. No rash noted. Psychiatric: Normal mood and affect.      LABORATORY PANEL:   CBC Recent Labs  Lab 07/17/17 0514  WBC 7.2  HGB 10.7*  HCT 31.2*  PLT 61*   ------------------------------------------------------------------------------------------------------------------  Chemistries  Recent Labs  Lab 07/17/17 0055  NA 137  K 3.7  CL 105  CO2 24  GLUCOSE 121*  BUN 27*  CREATININE 0.88  CALCIUM 8.4*   ------------------------------------------------------------------------------------------------------------------  Cardiac Enzymes Recent Labs  Lab 07/16/17 2015 07/16/17 2332 07/17/17 0055  TROPONINI <0.03 <0.03 0.03*   ------------------------------------------------------------------------------------------------------------------  RADIOLOGY:  Ct Head Wo Contrast  Result Date: 07/16/2017 CLINICAL DATA:  Fall well going to the commode. No loss of consciousness. Lightheadedness without nausea. EXAM: CT HEAD WITHOUT CONTRAST CT CERVICAL SPINE WITHOUT CONTRAST TECHNIQUE: Multidetector CT imaging of the head and cervical spine was performed following the standard protocol without intravenous contrast. Multiplanar CT image reconstructions of the cervical spine were also generated. COMPARISON:  CT head without contrast 06/06/2017. MRI brain 06/15/2017. FINDINGS: CT HEAD FINDINGS Brain: Advanced atrophy and white matter disease is again noted. Ventricles are moderately dilated without significant interval change. No acute cortical infarct is present. The basal ganglia are intact. Insular ribbon is normal. Remote lacunar infarcts are again noted within the left thalamus and right external  capsule. The brainstem and cerebellum are normal. Vascular: Atherosclerotic calcifications are present at the dural margin  of the right vertebral artery and in both cavernous internal carotid arteries without a hyperdense vessel. Skull: A left scalp laceration and hematoma is present without underlying fracture. There is no acute or healing fracture. Calvarium is intact. No focal lytic or blastic lesions are present. Sinuses/Orbits: The paranasal sinuses and mastoid air cells are clear. Bilateral lens replacements are present. Globes and orbits are otherwise within normal limits bilaterally. CT CERVICAL SPINE FINDINGS Alignment: AP alignment is anatomic. Mild leftward curvature is present. Skull base and vertebrae: The craniocervical junction is within normal limits. Degenerative changes are present at C1-2. Soft tissues and spinal canal: Atherosclerotic calcifications are present bilaterally. Stenosis is more likely on the right. A heterogeneous enlarged thyroid is compatible with a multinodular goiter. No significant cervical adenopathy is present. Disc levels: There is fusion across the disc space at C3-4. Uncovertebral spurring contributes mild left foraminal narrowing. Severe right moderate left foraminal stenosis is present at C4-5. Moderate foraminal narrowing is worse on left at C5-6. Severe left and moderate right foraminal narrowing is present C6-7. Advanced facet hypertrophy is present at C7-T1. Upper chest: The lung apices are clear. IMPRESSION: 1. Left whole scalp laceration and hematoma without underlying fracture. 2. Stable atrophy and white matter disease. 3. Prominent ventricles. Normal pressure hydrocephalus is also considered. 4. No acute intracranial abnormality or significant interval change. 5. Multilevel degenerative changes in the cervical spine without acute fracture or traumatic subluxation. 6. Multinodular goiter. 7. Carotid bifurcation atherosclerosis with stenosis more likely on the  right. Electronically Signed   By: Marin Robertshristopher  Mattern M.D.   On: 07/16/2017 20:56   Ct Cervical Spine Wo Contrast  Result Date: 07/16/2017 CLINICAL DATA:  Fall well going to the commode. No loss of consciousness. Lightheadedness without nausea. EXAM: CT HEAD WITHOUT CONTRAST CT CERVICAL SPINE WITHOUT CONTRAST TECHNIQUE: Multidetector CT imaging of the head and cervical spine was performed following the standard protocol without intravenous contrast. Multiplanar CT image reconstructions of the cervical spine were also generated. COMPARISON:  CT head without contrast 06/06/2017. MRI brain 06/15/2017. FINDINGS: CT HEAD FINDINGS Brain: Advanced atrophy and white matter disease is again noted. Ventricles are moderately dilated without significant interval change. No acute cortical infarct is present. The basal ganglia are intact. Insular ribbon is normal. Remote lacunar infarcts are again noted within the left thalamus and right external capsule. The brainstem and cerebellum are normal. Vascular: Atherosclerotic calcifications are present at the dural margin of the right vertebral artery and in both cavernous internal carotid arteries without a hyperdense vessel. Skull: A left scalp laceration and hematoma is present without underlying fracture. There is no acute or healing fracture. Calvarium is intact. No focal lytic or blastic lesions are present. Sinuses/Orbits: The paranasal sinuses and mastoid air cells are clear. Bilateral lens replacements are present. Globes and orbits are otherwise within normal limits bilaterally. CT CERVICAL SPINE FINDINGS Alignment: AP alignment is anatomic. Mild leftward curvature is present. Skull base and vertebrae: The craniocervical junction is within normal limits. Degenerative changes are present at C1-2. Soft tissues and spinal canal: Atherosclerotic calcifications are present bilaterally. Stenosis is more likely on the right. A heterogeneous enlarged thyroid is compatible with  a multinodular goiter. No significant cervical adenopathy is present. Disc levels: There is fusion across the disc space at C3-4. Uncovertebral spurring contributes mild left foraminal narrowing. Severe right moderate left foraminal stenosis is present at C4-5. Moderate foraminal narrowing is worse on left at C5-6. Severe left and moderate right foraminal narrowing is present C6-7. Advanced  facet hypertrophy is present at C7-T1. Upper chest: The lung apices are clear. IMPRESSION: 1. Left whole scalp laceration and hematoma without underlying fracture. 2. Stable atrophy and white matter disease. 3. Prominent ventricles. Normal pressure hydrocephalus is also considered. 4. No acute intracranial abnormality or significant interval change. 5. Multilevel degenerative changes in the cervical spine without acute fracture or traumatic subluxation. 6. Multinodular goiter. 7. Carotid bifurcation atherosclerosis with stenosis more likely on the right. Electronically Signed   By: Marin Roberts M.D.   On: 07/16/2017 20:56     ASSESSMENT AND PLAN:   83 y/o male with Parkinson's Disease presents to the ED with falls and found to have abnormal EKG.   1. Abnormal EKG without acute evidence of MI with RBBB Follow ECHO and cards evaluation  2. Recurrent falls: I am suspecting related to underlying Parkinson's PT evaluation  3. Thrombocytopenia: Needs outpatient workup and follow up  4. Parkinson's with mild dementia: Continue Sinemet Continue Aricept and Zoloft  5. Accelerated Essential HTN: Continue lotensin and add norvasc for better BP control     Management plans discussed with the patient and family and they are in agreement.  CODE STATUS: full  TOTAL TIME TAKING CARE OF THIS PATIENT: 30 minutes.     POSSIBLE D/C tomorrow, DEPENDING ON CLINICAL CONDITION.   Emmer Lillibridge M.D on 07/17/2017 at 9:05 AM  Between 7am to 6pm - Pager - [phone] After 6pm go to [email or web address] - password [email or web address]  [facility]  Office  [phone]  CC: Primary care physician; Lynnea Ferrier, MD  Note: This dictation was prepared with Dragon dictation along with smaller phrase technology. Any transcriptional errors that result from this process are unintentional.

## 2017-07-17 NOTE — Progress Notes (Signed)
Called facility and gave nurse report. All questions answered. Nurse requested faxed [facility] Will fax. Jari FavreSteven M [facility]

## 2017-07-17 NOTE — Discharge Instructions (Signed)

## 2017-07-17 NOTE — Evaluation (Addendum)
Physical Therapy Evaluation Patient Details Name: Perry [name] MRN: [id number] DOB: 04/10/1929 Today's Date: 07/17/2017   History of Present Illness  Pt admitted following a fall and right bundle branch block which was found when pt came to the ED.  PMH includes venous insufficiency, PD, mitral regurgitation, HTN, MI, frequent falls.  Clinical Impression  Pt is an 83 year old male who lives in a nursing facility and has been receiving PT services recently.  Pt required mod assistance for bed mobility, STS transfer and balance once standing as well as toilet transfer.  He attempted to stand 3x and was eventually able to stand upright and place hands properly on RW.  Pt presented with a R lean when first standing which required mod assistance from PT to correct and return to sitting.  Pt was able to ambulate 15-20 ft to the bathroom in his room and required mod assistance to sit.  Pt presented with overall decreased UE/LE strength and intact sensation.  Pt will continue to benefit from skilled PT with focus on strength, balance, functional mobility, safe use of DME and ambulation.  PT discussed pt need for SNF at this time with pt's daughter.    Follow Up Recommendations SNF    Equipment Recommendations       Recommendations for Other Services       Precautions / Restrictions Precautions Precautions: Fall Restrictions Weight Bearing Restrictions: No      Mobility  Bed Mobility Overal bed mobility: Needs Assistance Bed Mobility: Supine to Sit     Supine to sit: Mod assist        Transfers Overall transfer level: Needs assistance Equipment used: Rolling walker (2 wheeled) Transfers: Sit to/from Stand Sit to Stand: Mod assist         General transfer comment: Pt presented with leaning to R and wide BOS with difficulty placing feet.  PT provided VC's for hand placement on RW and foot placement for safe stance.  Ambulation/Gait Ambulation/Gait assistance: Min  guard Ambulation Distance (Feet): 15 Feet Assistive device: Rolling walker (2 wheeled)     Gait velocity interpretation: Below normal speed for age/gender General Gait Details: Pt presented with difficulty initiating gait, low foot clearance, decreased hip and knee flexion, flexed posture, slow gait  Stairs            Wheelchair Mobility    Modified Rankin (Stroke Patients Only)       Balance Overall balance assessment: Needs assistance;History of Falls Sitting-balance support: Bilateral upper extremity supported     Postural control: Right lateral lean Standing balance support: Bilateral upper extremity supported   Standing balance comment: pt presented with R lateral lean, leaning on PT for support.  He required multiple attempts to perform STS and remain upright.                             Pertinent Vitals/Pain Pain Assessment: No/denies pain    Home Living Family/patient expects to be discharged to:: Skilled nursing facility                      Prior Function Level of Independence: Needs assistance   Gait / Transfers Assistance Needed: Pt has been working with PT and requires assistance for STS and ambulating short distances.  ADL's / Homemaking Assistance Needed: Pt requires assistance for toileting and bathing.        Hand Dominance  Extremity/Trunk Assessment   Upper Extremity Assessment Upper Extremity Assessment: Generalized weakness    Lower Extremity Assessment Lower Extremity Assessment: Generalized weakness(Sensation intact.)    Cervical / Trunk Assessment Cervical / Trunk Assessment: Kyphotic  Communication   Communication: No difficulties  Cognition Arousal/Alertness: Lethargic Behavior During Therapy: WFL for tasks assessed/performed Overall Cognitive Status: Within Functional Limits for tasks assessed                                        General Comments      Exercises      Assessment/Plan    PT Assessment Patient needs continued PT services  PT Problem List Decreased strength;Decreased range of motion;Decreased activity tolerance;Decreased balance;Decreased mobility;Decreased coordination;Decreased knowledge of use of DME;Decreased safety awareness       PT Treatment Interventions DME instruction;Gait training;Functional mobility training;Therapeutic activities;Therapeutic exercise;Balance training;Neuromuscular re-education;Patient/family education    PT Goals (Current goals can be found in the Care Plan section)       Frequency Min 2X/week   Barriers to discharge        Co-evaluation               AM-PAC PT "6 Clicks" Daily Activity  Outcome Measure Difficulty turning over in bed (including adjusting bedclothes, sheets and blankets)?: A Lot Difficulty moving from lying on back to sitting on the side of the bed? : A Lot Difficulty sitting down on and standing up from a chair with arms (e.g., wheelchair, bedside commode, etc,.)?: A Lot Help needed moving to and from a bed to chair (including a wheelchair)?: A Lot Help needed walking in hospital room?: A Lot Help needed climbing 3-5 steps with a railing? : A Lot 6 Click Score: 12    End of Session Equipment Utilized During Treatment: Gait belt Activity Tolerance: Patient limited by fatigue Patient left: with nursing/sitter in room;with family/visitor present(Pt left with Nursing assistant in bathroom.) Nurse Communication: Mobility status PT Visit Diagnosis: Unsteadiness on feet (R26.81);Other abnormalities of gait and mobility (R26.89);Repeated falls (R29.6);Muscle weakness (generalized) (M62.81);History of falling (Z91.81);Difficulty in walking, not elsewhere classified (R26.2)    Time: [id number] PT Time Calculation (min) (ACUTE ONLY): 25 min   Charges:   PT Evaluation $PT Eval Moderate Complexity: 1 Mod PT Treatments $Therapeutic Activity: 8-22 mins   PT G Codes:   PT G-Codes  **NOT FOR INPATIENT CLASS** Functional Assessment Tool Used: AM-PAC 6 Clicks Basic Mobility Functional Limitation: Mobility: Walking and moving around Mobility: Walking and Moving Around Current Status (V4098(G8978): At least 80 percent but less than 100 percent impaired, limited or restricted Mobility: Walking and Moving Around Goal Status [id number](G8979): At least 1 percent but less than 20 percent impaired, limited or restricted    Glenetta HewSarah Alaira Level, PT, DPT   Glenetta HewSarah Graciemae Delisle 07/17/2017, 12:09 PM

## 2017-07-17 NOTE — Clinical Social Work Note (Signed)
Clinical Social Work Assessment  Patient Details  Name: Pierre BaliLacy R Bergthold MRN: [id number] Date of Birth: 07/18/1929  Date of referral:  07/17/17               Reason for consult:  Facility Placement                Permission sought to share information with:  Family Supports, Magazine features editoracility Contact Representative Permission granted to share information::  Yes, Verbal Permission Granted  Name::     Drue NovelFogleman,Laura Spouse [phone]  [phone] or [facility]der,Margaret Daughter [phone]  Agency::  SNF facilities  Relationship::     Contact Information:     Housing/Transportation Living arrangements for the past 2 months:  Skilled Nursing Facility Source of Information:  Adult Children Patient Interpreter Needed:  None Criminal Activity/Legal Involvement Pertinent to Current Situation/Hospitalization:  No - Comment as needed Significant Relationships:  Adult Children, Spouse Lives with:  Spouse Do you feel safe going back to the place where you live?  Yes Need for family participation in patient care:  No (Coment)  Care giving concerns:  Patient is from [facility], patient's family is in agreement with having patient return.   Social Worker assessment / plan:  Patient is a 83 year old male who is married and is [facility] at [facility].  Patient went there for short term rehab initially but then ended up having to stay after his Medicare days were used up.  Patient's family has been paying privately while they are trying to find an ALF for patient.  Patient's family have found and ALF, they are just waiting for bed availability.  Patient's family states that [facility]  Patient's family would like him to return back to ALF.  Patient's family did not have any other questions or concerns.  Employment status:  Retired Database administratornsurance information:  Managed Medicare PT Recommendations:  Skilled Nursing Facility Information / Referral  to community resources:  Skilled Nursing Facility  Patient/Family's Response to care:  Patient's family is agreeable to going back to SNF.  Patient/Family's Understanding of and Emotional Response to Diagnosis, Current Treatment, and Prognosis:  Patient's family is hopeful that ALF bed will open up soon, so patient can go to ALF.  Emotional Assessment Appearance:  Appears stated age Attitude/Demeanor/Rapport:    Affect (typically observed):  Appropriate, Calm Orientation:  Oriented to  Time, Oriented to Place, Oriented to Self, Oriented to Situation Alcohol / Substance use:  Not Applicable Psych involvement (Current and /or in the community):     Discharge Needs  Concerns to be addressed:  Care Coordination Readmission within the last 30 days:  No Current discharge risk:  Lack of support system Barriers to Discharge:  No Barriers Identified   [place] [employer], Dae Antonucci R, LCSWA 07/17/2017, 4:49 PM

## 2017-07-17 NOTE — Discharge Summary (Signed)
[facility] -  at [facility]   PATIENT NAME: Perry [name]    MR#:  [id number]  DATE OF BIRTH:  02/11/1929  DATE OF ADMISSION:  07/16/2017 ADMITTING PHYSICIAN: Oralia Manisavid Willis, MD  DATE OF DISCHARGE: 07/17/2017  PRIMARY CARE PHYSICIAN: [name], Bert J III, MD    ADMISSION DIAGNOSIS:  Fall, initial encounter [phone][W19.XXXA] Laceration of scalp without foreign body, initial encounter [S01.01XA]  DISCHARGE DIAGNOSIS:  Principal Problem:   Recurrent falls Active Problems:   Parkinson's disease (HCC)   Hypertension   CAD (coronary artery disease)   New onset right bundle branch block (RBBB)   SECONDARY DIAGNOSIS:   Past Medical History:  Diagnosis Date  . Aortic insufficiency    a. 06/2017 Echo: Mild to mod AI.  Marland Kitchen. Arthritis   . Diastolic dysfunction    a. 06/2017 Echo: EF 50-55%, basal to mid inf HK. Gr1 DD. mild to mod AI. Mild MR. sev dil LA. mild TR. PasP 45mmHg.  . Frequent falls   . Heart attack (HCC)   . History of bradycardia    a. beta blocker d/c'd 12/2016.  Marland Kitchen. Hyperlipidemia   . Hypertension   . Mitral regurgitation    a. 06/2017 Echo: Mild MR.  . Parkinson's disease (HCC)   . S/P TURP   . Urinary incontinence   . Venous insufficiency of both lower extremities     HOSPITAL COURSE:  83 y/o male with Parkinson's Disease presents to the ED with falls and found to have abnormal EKG.   1. Abnormal EKG without acute evidence of MI with RBBB He does have a h/o IVCD dating back to 2017 with 05/17/2016 ECG revealing an atypical RBBB, fairly similar to ecg on this presentation.  Though he does have a LAD and LAFB, his PR interval remains nl - just shy of 200 msecs, thus he does not meet criteria for trifascicular block.  Regardless, though he has had frequent falls, he has not had syncope. No events on tele.  Echo 06/2017 showed nl EF w/ inf HK. He will follow up with cardiology as an outpatient.   2. Recurrent falls: I am suspecting related to  underlying Parkinson's He will continue PT. He has scalp laceration and staples. These can be removed in 1 week.  3. Thrombocytopenia: Needs outpatient workup and follow up  4. Parkinson's with mild dementia: Continue Sinemet Continue Aricept and Zoloft  5. Accelerated Essential HTN: Continue lotensin and add norvasc for better BP control 6. 6.  Multinodular Goiter: incidentally noted on CT cervical spine      DISCHARGE CONDITIONS AND DIET:   Stable Regular diet  CONSULTS OBTAINED:  Treatment Team:  Iran OuchArida, Muhammad A, MD  DRUG ALLERGIES:   Allergies  Allergen Reactions  . Tetanus Toxoids Other (See Comments)    REACTION: " 50 CENT PIECE SIZE WELTS AND CLOSES MY EYES"  . Atorvastatin Other (See Comments)    REACTION: " MUSCLE WEAKNESS"  . Rosuvastatin Other (See Comments)    REACTION: " MUSCLE WEAKNESS"    DISCHARGE MEDICATIONS:   Allergies as of 07/17/2017      Reactions   Tetanus Toxoids Other (See Comments)   REACTION: " 50 CENT PIECE SIZE WELTS AND CLOSES MY EYES"   Atorvastatin Other (See Comments)   REACTION: " MUSCLE WEAKNESS"   Rosuvastatin Other (See Comments)   REACTION: " MUSCLE WEAKNESS"      Medication List    TAKE these medications   benazepril 20 MG tablet Commonly known as:  LOTENSIN Take 20 mg by mouth 2 (two) times daily.   calcium-vitamin D 500-200 MG-UNIT tablet Commonly known as:  OSCAL WITH D Take 1 tablet by mouth daily.   carbidopa-levodopa 25-100 MG tablet Commonly known as:  SINEMET IR Take 2 tablets by mouth 3 (three) times daily.   CIALIS 20 MG tablet Generic drug:  tadalafil Take 20 mg by mouth daily as needed. (erectile dysfunction)   Co Q-10 100 MG Caps Take 100 mg by mouth daily.   donepezil 10 MG tablet Commonly known as:  ARICEPT Take 10 mg by mouth at bedtime.   doxazosin 8 MG tablet Commonly known as:  CARDURA Take 1 tablet (8 mg total) by mouth 2 (two) times daily.   fish oil-omega-3 fatty acids 1000  MG capsule Take 2 g by mouth daily.   fluticasone 50 MCG/ACT nasal spray Commonly known as:  FLONASE Place 2 sprays into both nostrils daily.   HYDROcodone-acetaminophen 5-325 MG tablet Commonly known as:  NORCO/VICODIN Take 1 tablet by mouth every 4 (four) hours as needed for moderate pain.   magnesium oxide 400 MG tablet Commonly known as:  MAG-OX Take 400 mg by mouth daily.   megestrol 20 MG tablet Commonly known as:  MEGACE Take 20 mg by mouth daily.   MYRBETRIQ 50 MG Tb24 tablet Generic drug:  mirabegron ER Take 100 mg by mouth daily.   Polyethyl Glyc-Propyl Glyc PF 0.4-0.3 % Soln Apply 1 drop to eye daily.   polyethylene glycol packet Commonly known as:  MIRALAX / GLYCOLAX Take 17 g by mouth daily.   potassium chloride SA 20 MEQ tablet Commonly known as:  K-DUR,KLOR-CON Take 20 mEq by mouth 2 (two) times daily.   sertraline 50 MG tablet Commonly known as:  ZOLOFT Take 150 mg by mouth daily.   timolol 0.5 % ophthalmic solution Commonly known as:  TIMOPTIC Place 1 drop into the left eye 2 (two) times daily.   traZODone 50 MG tablet Commonly known as:  DESYREL Take 25 mg by mouth at bedtime as needed for sleep.   V-R VITAMIN B-12 500 MCG tablet Generic drug:  cyanocobalamin Take 500 mcg by mouth daily.   Vitamin D 2000 units Caps Take 2,000 Units by mouth daily.         Today   CHIEF COMPLAINT:  No issues overnight   VITAL SIGNS:  Blood pressure (!) 183/68, pulse 60, temperature 99.1 F (37.3 C), temperature source Oral, resp. rate 19, height 5\' 8"  (1.727 m), weight 87.5 kg (193 lb), SpO2 100 %.   REVIEW OF SYSTEMS:  Review of Systems  Constitutional: Negative.  Negative for chills, fever and malaise/fatigue.  HENT: Negative.  Negative for ear discharge, ear pain, hearing loss, nosebleeds and sore throat.   Eyes: Negative.  Negative for blurred vision and pain.  Respiratory: Negative.  Negative for cough, hemoptysis, shortness of breath  and wheezing.   Cardiovascular: Negative.  Negative for chest pain, palpitations and leg swelling.  Gastrointestinal: Negative.  Negative for abdominal pain, blood in stool, diarrhea, nausea and vomiting.  Genitourinary: Negative.  Negative for dysuria.  Musculoskeletal: Positive for falls. Negative for back pain.  Skin: Negative.   Neurological: Negative for dizziness, tremors, speech change, focal weakness, seizures and headaches.       Parkinson's  Endo/Heme/Allergies: Negative.  Does not bruise/bleed easily.  Psychiatric/Behavioral: Negative.  Negative for depression, hallucinations and suicidal ideas.     PHYSICAL EXAMINATION:  GENERAL:  83 y.o.-year-old patient lying in the  bed with no acute distress.  NECK:  Supple, no jugular venous distention. No thyroid enlargement, no tenderness.  LUNGS: Normal breath sounds bilaterally, no wheezing, rales,rhonchi  No use of accessory muscles of respiration.  CARDIOVASCULAR: S1, S2 normal. No murmurs, rubs, or gallops.  ABDOMEN: Soft, non-tender, non-distended. Bowel sounds present. No organomegaly or mass.  EXTREMITIES: No pedal edema, cyanosis, or clubbing.  PSYCHIATRIC: The patient is alert and oriented x 3.  SKIN: No obvious rash, lesion, or ulcer.  Scalp laceration DATA REVIEW:   CBC Recent Labs  Lab 07/17/17 0514  WBC 7.2  HGB 10.7*  HCT 31.2*  PLT 61*    Chemistries  Recent Labs  Lab 07/17/17 0055  NA 137  K 3.7  CL 105  CO2 24  GLUCOSE 121*  BUN 27*  CREATININE 0.88  CALCIUM 8.4*    Cardiac Enzymes Recent Labs  Lab 07/16/17 2015 07/16/17 2332 07/17/17 0055  TROPONINI <0.03 <0.03 0.03*    Microbiology Results  @[email or web address] @  RADIOLOGY:  Ct Head Wo Contrast  Result Date: 07/16/2017 CLINICAL DATA:  Fall well going to the commode. No loss of consciousness. Lightheadedness without nausea. EXAM: CT HEAD WITHOUT CONTRAST CT CERVICAL SPINE WITHOUT CONTRAST TECHNIQUE: Multidetector CT imaging of the head and  cervical spine was performed following the standard protocol without intravenous contrast. Multiplanar CT image reconstructions of the cervical spine were also generated. COMPARISON:  CT head without contrast 06/06/2017. MRI brain 06/15/2017. FINDINGS: CT HEAD FINDINGS Brain: Advanced atrophy and white matter disease is again noted. Ventricles are moderately dilated without significant interval change. No acute cortical infarct is present. The basal ganglia are intact. Insular ribbon is normal. Remote lacunar infarcts are again noted within the left thalamus and right external capsule. The brainstem and cerebellum are normal. Vascular: Atherosclerotic calcifications are present at the dural margin of the right vertebral artery and in both cavernous internal carotid arteries without a hyperdense vessel. Skull: A left scalp laceration and hematoma is present without underlying fracture. There is no acute or healing fracture. Calvarium is intact. No focal lytic or blastic lesions are present. Sinuses/Orbits: The paranasal sinuses and mastoid air cells are clear. Bilateral lens replacements are present. Globes and orbits are otherwise within normal limits bilaterally. CT CERVICAL SPINE FINDINGS Alignment: AP alignment is anatomic. Mild leftward curvature is present. Skull base and vertebrae: The craniocervical junction is within normal limits. Degenerative changes are present at C1-2. Soft tissues and spinal canal: Atherosclerotic calcifications are present bilaterally. Stenosis is more likely on the right. A heterogeneous enlarged thyroid is compatible with a multinodular goiter. No significant cervical adenopathy is present. Disc levels: There is fusion across the disc space at C3-4. Uncovertebral spurring contributes mild left foraminal narrowing. Severe right moderate left foraminal stenosis is present at C4-5. Moderate foraminal narrowing is worse on left at C5-6. Severe left and moderate right foraminal narrowing  is present C6-7. Advanced facet hypertrophy is present at C7-T1. Upper chest: The lung apices are clear. IMPRESSION: 1. Left whole scalp laceration and hematoma without underlying fracture. 2. Stable atrophy and white matter disease. 3. Prominent ventricles. Normal pressure hydrocephalus is also considered. 4. No acute intracranial abnormality or significant interval change. 5. Multilevel degenerative changes in the cervical spine without acute fracture or traumatic subluxation. 6. Multinodular goiter. 7. Carotid bifurcation atherosclerosis with stenosis more likely on the right. Electronically Signed   By: Marin Roberts M.D.   On: 07/16/2017 20:56   Ct Cervical Spine Wo Contrast  Result Date: 07/16/2017  CLINICAL DATA:  Fall well going to the commode. No loss of consciousness. Lightheadedness without nausea. EXAM: CT HEAD WITHOUT CONTRAST CT CERVICAL SPINE WITHOUT CONTRAST TECHNIQUE: Multidetector CT imaging of the head and cervical spine was performed following the standard protocol without intravenous contrast. Multiplanar CT image reconstructions of the cervical spine were also generated. COMPARISON:  CT head without contrast 06/06/2017. MRI brain 06/15/2017. FINDINGS: CT HEAD FINDINGS Brain: Advanced atrophy and white matter disease is again noted. Ventricles are moderately dilated without significant interval change. No acute cortical infarct is present. The basal ganglia are intact. Insular ribbon is normal. Remote lacunar infarcts are again noted within the left thalamus and right external capsule. The brainstem and cerebellum are normal. Vascular: Atherosclerotic calcifications are present at the dural margin of the right vertebral artery and in both cavernous internal carotid arteries without a hyperdense vessel. Skull: A left scalp laceration and hematoma is present without underlying fracture. There is no acute or healing fracture. Calvarium is intact. No focal lytic or blastic lesions are  present. Sinuses/Orbits: The paranasal sinuses and mastoid air cells are clear. Bilateral lens replacements are present. Globes and orbits are otherwise within normal limits bilaterally. CT CERVICAL SPINE FINDINGS Alignment: AP alignment is anatomic. Mild leftward curvature is present. Skull base and vertebrae: The craniocervical junction is within normal limits. Degenerative changes are present at C1-2. Soft tissues and spinal canal: Atherosclerotic calcifications are present bilaterally. Stenosis is more likely on the right. A heterogeneous enlarged thyroid is compatible with a multinodular goiter. No significant cervical adenopathy is present. Disc levels: There is fusion across the disc space at C3-4. Uncovertebral spurring contributes mild left foraminal narrowing. Severe right moderate left foraminal stenosis is present at C4-5. Moderate foraminal narrowing is worse on left at C5-6. Severe left and moderate right foraminal narrowing is present C6-7. Advanced facet hypertrophy is present at C7-T1. Upper chest: The lung apices are clear. IMPRESSION: 1. Left whole scalp laceration and hematoma without underlying fracture. 2. Stable atrophy and white matter disease. 3. Prominent ventricles. Normal pressure hydrocephalus is also considered. 4. No acute intracranial abnormality or significant interval change. 5. Multilevel degenerative changes in the cervical spine without acute fracture or traumatic subluxation. 6. Multinodular goiter. 7. Carotid bifurcation atherosclerosis with stenosis more likely on the right. Electronically Signed   By: Marin Roberts M.D.   On: 07/16/2017 20:56      Allergies as of 07/17/2017      Reactions   Tetanus Toxoids Other (See Comments)   REACTION: " 50 CENT PIECE SIZE WELTS AND CLOSES MY EYES"   Atorvastatin Other (See Comments)   REACTION: " MUSCLE WEAKNESS"   Rosuvastatin Other (See Comments)   REACTION: " MUSCLE WEAKNESS"      Medication List    TAKE these  medications   benazepril 20 MG tablet Commonly known as:  LOTENSIN Take 20 mg by mouth 2 (two) times daily.   calcium-vitamin D 500-200 MG-UNIT tablet Commonly known as:  OSCAL WITH D Take 1 tablet by mouth daily.   carbidopa-levodopa 25-100 MG tablet Commonly known as:  SINEMET IR Take 2 tablets by mouth 3 (three) times daily.   CIALIS 20 MG tablet Generic drug:  tadalafil Take 20 mg by mouth daily as needed. (erectile dysfunction)   Co Q-10 100 MG Caps Take 100 mg by mouth daily.   donepezil 10 MG tablet Commonly known as:  ARICEPT Take 10 mg by mouth at bedtime.   doxazosin 8 MG tablet Commonly known as:  CARDURA Take 1 tablet (8 mg total) by mouth 2 (two) times daily.   fish oil-omega-3 fatty acids 1000 MG capsule Take 2 g by mouth daily.   fluticasone 50 MCG/ACT nasal spray Commonly known as:  FLONASE Place 2 sprays into both nostrils daily.   HYDROcodone-acetaminophen 5-325 MG tablet Commonly known as:  NORCO/VICODIN Take 1 tablet by mouth every 4 (four) hours as needed for moderate pain.   magnesium oxide 400 MG tablet Commonly known as:  MAG-OX Take 400 mg by mouth daily.   megestrol 20 MG tablet Commonly known as:  MEGACE Take 20 mg by mouth daily.   MYRBETRIQ 50 MG Tb24 tablet Generic drug:  mirabegron ER Take 100 mg by mouth daily.   Polyethyl Glyc-Propyl Glyc PF 0.4-0.3 % Soln Apply 1 drop to eye daily.   polyethylene glycol packet Commonly known as:  MIRALAX / GLYCOLAX Take 17 g by mouth daily.   potassium chloride SA 20 MEQ tablet Commonly known as:  K-DUR,KLOR-CON Take 20 mEq by mouth 2 (two) times daily.   sertraline 50 MG tablet Commonly known as:  ZOLOFT Take 150 mg by mouth daily.   timolol 0.5 % ophthalmic solution Commonly known as:  TIMOPTIC Place 1 drop into the left eye 2 (two) times daily.   traZODone 50 MG tablet Commonly known as:  DESYREL Take 25 mg by mouth at bedtime as needed for sleep.   V-R VITAMIN B-12 500 MCG  tablet Generic drug:  cyanocobalamin Take 500 mcg by mouth daily.   Vitamin D 2000 units Caps Take 2,000 Units by mouth daily.         Management plans discussed with the patient and he is in agreement. Stable for discharge   Patient should follow up with pcp  CODE STATUS:     Code Status Orders  (From admission, onward)        Start     Ordered   07/17/17 0022  Full code  Continuous     07/17/17 0021    Code Status History    Date Active Date Inactive Code Status Order ID Comments User Context   06/14/2017 23:32 06/16/2017 21:44 Full Code [id number]  Lorretta Harp, MD ED    Advance Directive Documentation     Most Recent Value  Type of Advance Directive  Healthcare Power of Attorney, Living will  Pre-existing out of facility DNR order (yellow form or pink MOST form)  No data  "MOST" Form in Place?  No data      TOTAL TIME TAKING CARE OF THIS PATIENT: 38 minutes.    Note: This dictation was prepared with Dragon dictation along with smaller phrase technology. Any transcriptional errors that result from this process are unintentional.  Jem Castro M.D on 07/17/2017 at 10:15 AM  Between 7am to 6pm - Pager - [phone] After 6pm go to [email or web address] - password [email or web address]  [facility]  Office  [phone]  CC: Primary care physician; Lynnea Ferrier, MD

## 2017-07-17 NOTE — Consult Note (Signed)
Cardiology Consult    Patient ID: Perry [name] MRN: [id number], DOB/AGE: 81-Nov-1930   Admit date: 07/16/2017 Date of Consult: 07/17/2017  Primary Physician: Lynnea Ferrier, MD Primary Cardiologist: Julien Nordmann, MD Requesting Provider: Camillia Herter, MD  Patient Profile    Perry [name] is a 83 y.o. male with a history of HTN, HL, Parkinson's, dementia, diastolic dysfxn, venous insuff, bradycardia on  blocker in past, and frequent falls, who is being seen today for the evaluation of Fall/RBBB at the request of Dr. Juliene Pina.  Past Medical History   Past Medical History:  Diagnosis Date  . Aortic insufficiency    a. 06/2017 Echo: Mild to mod AI.  Marland Kitchen Arthritis   . Diastolic dysfunction    a. 06/2017 Echo: EF 50-55%, basal to mid inf HK. Gr1 DD. mild to mod AI. Mild MR. sev dil LA. mild TR. PasP .  . Frequent falls   . Heart attack (HCC)   . History of bradycardia    a. beta blocker d/c'd 12/2016.  Marland Kitchen Hyperlipidemia   . Hypertension   . Mitral regurgitation    a. 06/2017 Echo: Mild MR.  . Parkinson's disease (HCC)   . S/P TURP   . Urinary incontinence   . Venous insufficiency of both lower extremities     Past Surgical History:  Procedure Laterality Date  . CHOLECYSTECTOMY    . HERNIA REPAIR    . HIP FUSION     lt  . KNEE SURGERY     rt knee  . PROSTATE SURGERY    . TONSILLECTOMY    . WRIST SURGERY       Allergies  Allergies  Allergen Reactions  . Tetanus Toxoids Other (See Comments)    REACTION: " 50 CENT PIECE SIZE WELTS AND CLOSES MY EYES"  . Atorvastatin Other (See Comments)    REACTION: " MUSCLE WEAKNESS"  . Rosuvastatin Other (See Comments)    REACTION: " MUSCLE WEAKNESS"    History of Present Illness    83 y/o ? with the above complex PMH including HTN, Parkinson's, dementia, diastolic dysfxn, venous insuff w/ lower ext edema, and frequent falls.  He was last seen in May by Dr. Mariah Milling, @ which time he was noted to be bradycardic and  blocker was  d/c'd.  Per family, he was doing reasonably well @ home, though due to progression of parkinson's his processing and gait have slowed and with that, he has had frequent falls w/o significant trauma.  In early November, he was admitted after being found unresponsive.  Per family, he was sitting with his eyes open and breathing, but was not responding.  During admission, tele, Head CT, MRI brain, EEG, CTA chest, and echo were all relatively nl. He was noted to have AKI, which resolved with IVF.  He was subsequently d/c'd to SNF where he has been working with PT.  Family notes that his physical progression has been slow and he is not quite back to baseline levels of activity.  His gait remains unsteady and he had fallen four times prior to yesterday - mostly when trying to get up on his own w/o use of a walker.  He has not had any recurrent unresponsive episodes.  On 12/5, his family had just assisted him into his bathroom (he had used a walker) and his wife and dtr stepped out of the bathroom for 2 seconds to hit the call bell to alert staff that he was in the bathroom,  when he fell suddenly, prior to getting onto the toilet.  Family attended to him immediately, followed by staff.  At no point did he lose consciousness.  He was noted to have a scalp laceration and as a result, EMS was called and he was transported to [facility] for eval.  Here, ECG showed RBBB, in the setting of prior h/o IVCD/atypical RBBB on older ECGs.  He was otw hemodynamically stable.  Due to concern over RBBB, he was admitted for further eval.  Trop is mildly elevated @ 0.03 this AM. He denies any recent h/o chest pain or dyspnea.  Pt and family are clear that he did not lose consciousness yesterday.  No events on tele overnight.  Inpatient Medications    . amLODipine  5 mg Oral Daily  . benazepril  20 mg Oral BID  . carbidopa-levodopa  2 tablet Oral TID  . donepezil  10 mg Oral QHS  . doxazosin  8 mg Oral BID  . enoxaparin (LOVENOX)  injection  40 mg Subcutaneous Q24H  . mirabegron ER  100 mg Oral Daily  . sertraline  150 mg Oral Daily  . timolol  1 drop Left Eye BID    Family History    Family History  Problem Relation Age of Onset  . Healthy Mother   . Hypertension Father   . Ovarian cancer Sister   . Appendicitis Brother   . Hypertension Sister   . Heart disease Sister     Social History    Social History   Socioeconomic History  . Marital status: Married    Spouse name: Not on file  . Number of children: Not on file  . Years of education: Not on file  . Highest education level: Not on file  Social Needs  . Financial resource strain: Not on file  . Food insecurity - worry: Not on file  . Food insecurity - inability: Not on file  . Transportation needs - medical: Not on file  . Transportation needs - non-medical: Not on file  Occupational History  . Not on file  Tobacco Use  . Smoking status: Former Smoker    Types: Cigars  . Smokeless tobacco: Never Used  Substance and Sexual Activity  . Alcohol use: No    Alcohol/week: 0.0 oz  . Drug use: Not on file  . Sexual activity: Not on file  Other Topics Concern  . Not on file  Social History Narrative   Currently staying @ SNF.  Receiving PT regularly. Family looking to move him into assisted living closer to wife.     Review of Systems    General:  No chills, fever, night sweats or weight changes.  Cardiovascular:  No chest pain, dyspnea on exertion, h/o LE edema w/ stasis dermatitis, though no recent swelling, no orthopnea, palpitations, paroxysmal nocturnal dyspnea. Dermatological: No rash, lesions/masses Respiratory: No cough, dyspnea Urologic: No hematuria, dysuria Abdominal:   No nausea, vomiting, diarrhea, bright red blood per rectum, melena, or hematemesis Neurologic:  No visual changes, +++ wkns, +++ delayed response time. MSK: leg wkns/freq falls. All other systems reviewed and are otherwise negative except as noted  above.  Physical Exam    Blood pressure (!) 183/67, pulse 61, temperature 99.1 F (37.3 C), temperature source Oral, resp. rate 17, height 5\' 8"  (1.727 m), weight 193 lb (87.5 kg), SpO2 98 %.  General: Pleasant, NAD Psych: Flat affect. Neuro: Alert and oriented X 3. Moves all extremities spontaneously. HEENT: Normal  Neck: Supple  without bruits or JVD. Lungs:  Resp regular and unlabored, CTA. Heart: RRR no s3, s4, or 2/6 syst murmur @ apex. Abdomen: Soft, non-tender, non-distended, BS + x 4.  Extremities: No clubbing, cyanosis or edema. DP/PT/Radials 2+ and equal bilaterally.  Labs     Recent Labs    07/16/17 2015 07/16/17 2332 07/17/17 0055  TROPONINI <0.03 <0.03 0.03*   Lab Results  Component Value Date   WBC 7.2 07/17/2017   HGB 10.7 (L) 07/17/2017   HCT 31.2 (L) 07/17/2017   MCV 100.5 (H) 07/17/2017   PLT 61 (L) 07/17/2017    Recent Labs  Lab 07/17/17 0055  NA 137  K 3.7  CL 105  CO2 24  BUN 27*  CREATININE 0.88  CALCIUM 8.4*  GLUCOSE 121*    Radiology Studies    Ct Head Wo Contrast  Result Date: 07/16/2017 CLINICAL DATA:  Fall well going to the commode. No loss of consciousness. Lightheadedness without nausea. EXAM: CT HEAD WITHOUT CONTRAST CT CERVICAL SPINE WITHOUT CONTRAST TECHNIQUE: Multidetector CT imaging of the head and cervical spine was performed following the standard protocol without intravenous contrast. Multiplanar CT image reconstructions of the cervical spine were also generated. COMPARISON:  CT head without contrast 06/06/2017. MRI brain 06/15/2017. FINDINGS: CT HEAD FINDINGS Brain: Advanced atrophy and white matter disease is again noted. Ventricles are moderately dilated without significant interval change. No acute cortical infarct is present. The basal ganglia are intact. Insular ribbon is normal. Remote lacunar infarcts are again noted within the left thalamus and right external capsule. The brainstem and cerebellum are normal. Vascular:  Atherosclerotic calcifications are present at the dural margin of the right vertebral artery and in both cavernous internal carotid arteries without a hyperdense vessel. Skull: A left scalp laceration and hematoma is present without underlying fracture. There is no acute or healing fracture. Calvarium is intact. No focal lytic or blastic lesions are present. Sinuses/Orbits: The paranasal sinuses and mastoid air cells are clear. Bilateral lens replacements are present. Globes and orbits are otherwise within normal limits bilaterally. CT CERVICAL SPINE FINDINGS Alignment: AP alignment is anatomic. Mild leftward curvature is present. Skull base and vertebrae: The craniocervical junction is within normal limits. Degenerative changes are present at C1-2. Soft tissues and spinal canal: Atherosclerotic calcifications are present bilaterally. Stenosis is more likely on the right. A heterogeneous enlarged thyroid is compatible with a multinodular goiter. No significant cervical adenopathy is present. Disc levels: There is fusion across the disc space at C3-4. Uncovertebral spurring contributes mild left foraminal narrowing. Severe right moderate left foraminal stenosis is present at C4-5. Moderate foraminal narrowing is worse on left at C5-6. Severe left and moderate right foraminal narrowing is present C6-7. Advanced facet hypertrophy is present at C7-T1. Upper chest: The lung apices are clear. IMPRESSION: 1. Left whole scalp laceration and hematoma without underlying fracture. 2. Stable atrophy and white matter disease. 3. Prominent ventricles. Normal pressure hydrocephalus is also considered. 4. No acute intracranial abnormality or significant interval change. 5. Multilevel degenerative changes in the cervical spine without acute fracture or traumatic subluxation. 6. Multinodular goiter. 7. Carotid bifurcation atherosclerosis with stenosis more likely on the right. Electronically Signed   By: Marin Roberts M.D.    On: 07/16/2017 20:56   Ct Cervical Spine Wo Contrast  Result Date: 07/16/2017 CLINICAL DATA:  Fall well going to the commode. No loss of consciousness. Lightheadedness without nausea. EXAM: CT HEAD WITHOUT CONTRAST CT CERVICAL SPINE WITHOUT CONTRAST TECHNIQUE: Multidetector CT imaging of the  head and cervical spine was performed following the standard protocol without intravenous contrast. Multiplanar CT image reconstructions of the cervical spine were also generated. COMPARISON:  CT head without contrast 06/06/2017. MRI brain 06/15/2017. FINDINGS: CT HEAD FINDINGS Brain: Advanced atrophy and white matter disease is again noted. Ventricles are moderately dilated without significant interval change. No acute cortical infarct is present. The basal ganglia are intact. Insular ribbon is normal. Remote lacunar infarcts are again noted within the left thalamus and right external capsule. The brainstem and cerebellum are normal. Vascular: Atherosclerotic calcifications are present at the dural margin of the right vertebral artery and in both cavernous internal carotid arteries without a hyperdense vessel. Skull: A left scalp laceration and hematoma is present without underlying fracture. There is no acute or healing fracture. Calvarium is intact. No focal lytic or blastic lesions are present. Sinuses/Orbits: The paranasal sinuses and mastoid air cells are clear. Bilateral lens replacements are present. Globes and orbits are otherwise within normal limits bilaterally. CT CERVICAL SPINE FINDINGS Alignment: AP alignment is anatomic. Mild leftward curvature is present. Skull base and vertebrae: The craniocervical junction is within normal limits. Degenerative changes are present at C1-2. Soft tissues and spinal canal: Atherosclerotic calcifications are present bilaterally. Stenosis is more likely on the right. A heterogeneous enlarged thyroid is compatible with a multinodular goiter. No significant cervical adenopathy is  present. Disc levels: There is fusion across the disc space at C3-4. Uncovertebral spurring contributes mild left foraminal narrowing. Severe right moderate left foraminal stenosis is present at C4-5. Moderate foraminal narrowing is worse on left at C5-6. Severe left and moderate right foraminal narrowing is present C6-7. Advanced facet hypertrophy is present at C7-T1. Upper chest: The lung apices are clear. IMPRESSION: 1. Left whole scalp laceration and hematoma without underlying fracture. 2. Stable atrophy and white matter disease. 3. Prominent ventricles. Normal pressure hydrocephalus is also considered. 4. No acute intracranial abnormality or significant interval change. 5. Multilevel degenerative changes in the cervical spine without acute fracture or traumatic subluxation. 6. Multinodular goiter. 7. Carotid bifurcation atherosclerosis with stenosis more likely on the right. Electronically Signed   By: Marin Robertshristopher  Mattern M.D.   On: 07/16/2017 20:56    ECG & Cardiac Imaging    RSR, 76, LAD, LAFB, RBBB.  PR 198 msec.   Tele - no significant events - RSR.  Assessment & Plan    1.  RBBB: Old ECGs reviewed.  He does have a h/o IVCD dating back to 2017 with 05/17/2016 ECG revealing an atypical RBBB, fairly similar to ecg on this presentation.  Though he does have a LAD and LAFB, his PR interval remains nl - just shy of 200 msecs, thus he does not meet criteria for trifascicular block.  Regardless, though he has had frequent falls, he has not had syncope. No events on tele.  Echo 06/2017 showed nl EF w/ inf HK.  Echo reordered this admission  pending.  No indication for pacing.  Follow tele.    2.  Frequent Falls:  Pt with 5 falls since being @ SNF over past month.  Per family, four of those occurred when he tried to get up w/o assistance/walker.  Yesterday, he was using walker and apparently transferring onto the toilet when he fell.  Per family, he was conscious the whole time.  He denies any  presyncope.  It appears he simply became weak while transferring onto toilet.  Notably, he had refused PT yesterday morning b/c he was feeling week.  3.  Essential HTN:  BP elevated this AM.  F/u after AM meds.  4.  Scalp laceration: s/p staples.  Per IM.  5.  Parkinson's: per family, this seems to have progressed and he is more weak, gait more unsteady.  Likely contributing to falls.  6.  Multinodular Goiter: incidentally noted on CT cervical spine.  F/u TSH.  7.  Troponin elevation:  After two nl trops., trop this AM minimally elevated @ 0.03.  No c/p or dyspnea.  Echo pending.  This is not ACS.  Signed, Nicolasa Duckinghristopher Khalib Fendley, NP 07/17/2017, 9:35 AM  For questions or updates, please contact   Please consult [email or web address] for contact info under Cardiology/STEMI.

## 2017-07-17 NOTE — NC FL2 (Signed)
[place] MEDICAID FL2 LEVEL OF CARE SCREENING TOOL     IDENTIFICATION  Patient Name: Perry [name] Birthdate: 12/03/1928 Sex: male Admission Date (Current Location): 07/16/2017  [place]   Facility and Address:  [facility], [address], [place], [place] [address]      Provider Number: [id number]  Attending Physician Name and Address:  Adrian SaranMody, Sital, MD  Relative Name and Phone Number:  Drue NovelFogleman,Laura Spouse [phone]  [phone] or Se [facility] Hospitalolder,Margaret Daughter [phone]     Current Level of Care: Hospital Recommended Level of Care: Skilled Nursing Facility Prior Approval Number:    Date Approved/Denied:   PASRR Number: [id number]  Discharge Plan: SNF    Current Diagnoses: Patient Active Problem List   Diagnosis Date Noted  . Recurrent falls 07/16/2017  . New onset right bundle branch block (RBBB) 07/16/2017  . CAD (coronary artery disease) 06/14/2017  . AKI (acute kidney injury) (HCC) 06/14/2017  . Unresponsiveness 06/14/2017  . Thrombocytopenia (HCC) 06/14/2017  . Normocytic anemia 06/14/2017  . Depression 06/14/2017  . Parkinson's disease (HCC)   . Hypertension   . Hyperlipidemia   . Bradycardia 05/17/2016  . Benign hypertensive heart disease without heart failure 12/23/2012  . Dyspnea on exertion 08/25/2012  . Pedal edema 08/25/2012  . Heart murmur 08/25/2012  . Right inguinal hernia 02/28/2011    Orientation RESPIRATION BLADDER Height & Weight     Self, Time, Situation, Place  Normal Incontinent Weight: 193 lb (87.5 kg) Height:  5\' 8"  (172.7 cm)  BEHAVIORAL SYMPTOMS/MOOD NEUROLOGICAL BOWEL NUTRITION STATUS      Continent Diet  AMBULATORY STATUS COMMUNICATION OF NEEDS Skin   Limited Assist Verbally Surgical wounds                       Personal Care Assistance Level of Assistance  Bathing, Feeding, Dressing Bathing Assistance: Limited assistance Feeding  assistance: Independent Dressing Assistance: Limited assistance     Functional Limitations Info  Sight, Hearing, Speech Sight Info: Adequate Hearing Info: Adequate Speech Info: Adequate    SPECIAL CARE FACTORS FREQUENCY  PT (By licensed PT)     PT Frequency: 5x a week              Contractures      Additional Factors Info  Allergies, Code Status, Psychotropic Code Status Info: Full Code Allergies Info: TETANUS TOXOIDS, ATORVASTATIN, ROSUVASTATIN Psychotropic Info: sertraline (ZOLOFT) tablet 150 mg          Current Medications (07/17/2017):  This is the current hospital active medication list Current Facility-Administered Medications  Medication Dose Route Frequency Provider Last Rate Last Dose  . acetaminophen (TYLENOL) tablet 650 mg  650 mg Oral Q6H PRN Oralia ManisWillis, David, MD       Or  . acetaminophen (TYLENOL) suppository 650 mg  650 mg Rectal Q6H PRN Oralia ManisWillis, David, MD      . amLODipine (NORVASC) tablet 5 mg  5 mg Oral Daily Adrian SaranMody, Sital, MD   5 mg at 07/17/17 1011  . benazepril (LOTENSIN) tablet 20 mg  20 mg Oral BID Oralia ManisWillis, David, MD   20 mg at 07/17/17 1011  . carbidopa-levodopa (SINEMET IR) 25-100 MG per tablet immediate release 2 tablet  2 tablet Oral TID Oralia ManisWillis, David, MD   2 tablet at 07/17/17 1011  . donepezil (ARICEPT) tablet 10 mg  10 mg Oral Lamont SnowballQHS Willis, David, MD   10 mg at 07/17/17 0203  . doxazosin (CARDURA) tablet 8  mg  8 mg Oral BID Willis, David, MD   8 mg at 07/17/17 1010  . enoxaparin (LOVENOX) injection 40 mg  40 mg Subcutaneous Q24H Oralia ManisWillis, David, MD   40 mg at 07/17/17 [phone]  .Oralia Manis HYDROcodone-acetaminophen (NORCO/VICODIN) 5-325 MG per tablet 1 tablet  1 tablet Oral Q4H PRN Oralia ManisWillis, David, MD   1 tablet at 07/17/17 0203  . mirabegron ER (MYRBETRIQ) tablet 100 mg  100 mg Oral Daily Oralia ManisWillis, David, MD   100 mg at 07/17/17 1011  . ondansetron (ZOFRAN) tablet 4 mg  4 mg Oral Q6H PRN Oralia ManisWillis, David, MD       Or  . ondansetron [facility](ZOFRAN) injection 4 mg  4 mg  Intravenous Q6H PRN Oralia ManisWillis, David, MD      . sertraline (ZOLOFT) tablet 150 mg  150 mg Oral Daily Oralia ManisWillis, David, MD   150 mg at 07/17/17 1010  . timolol (TIMOPTIC) 0.5 % ophthalmic solution 1 drop  1 drop Left Eye BID Oralia ManisWillis, David, MD   1 drop at 07/17/17 1011  . traZODone (DESYREL) tablet 25 mg  25 mg Oral QHS PRN Oralia ManisWillis, David, MD         Discharge Medications: Please see discharge summary for a list of discharge medications.  Relevant Imaging Results:  Relevant Lab Results:   Additional Information SSN # [id number]  Darleene Cleavernterhaus, Taneal Sonntag R, [place]

## 2017-07-17 NOTE — Clinical Social Work Note (Signed)
CSW spoke with patient's daughter and she stated patient is from [facility], he was there for short term rehab, and now they have been paying privately while waiting for a bed to open at an ALF that [place].  Formal assessment to follow, patient's plan is to [facility].  Windell MouldingEric Yaphet Smethurst, MSW, Theresia MajorsLCSWA [phone]

## 2017-07-17 NOTE — Care Management Obs Status (Signed)
MEDICARE OBSERVATION STATUS NOTIFICATION   Patient Details  Name: Perry [name] MRN: [id number] Date of Birth: 03/29/1929   Medicare Observation Status Notification Given:  Yes Notice signed, one given to patient and the other to HIM for scanning    Eber HongGreene, Ventura Hollenbeck R, RN 07/17/2017, 12:00 PM

## 2017-07-28 ENCOUNTER — Encounter: Payer: Self-pay | Admitting: Intensive Care

## 2017-07-28 ENCOUNTER — Other Ambulatory Visit: Payer: Self-pay

## 2017-07-28 ENCOUNTER — Inpatient Hospital Stay
Admission: EM | Admit: 2017-07-28 | Discharge: 2017-07-30 | DRG: 580 | Disposition: A | Discharge status: Home Health | Payer: Medicare Other | Attending: Internal Medicine | Admitting: Internal Medicine

## 2017-07-28 DIAGNOSIS — L0291 Cutaneous abscess, unspecified: Secondary | ICD-10-CM | POA: Diagnosis present

## 2017-07-28 DIAGNOSIS — I10 Essential (primary) hypertension: Secondary | ICD-10-CM | POA: Diagnosis present

## 2017-07-28 DIAGNOSIS — R296 Repeated falls: Secondary | ICD-10-CM | POA: Diagnosis not present

## 2017-07-28 DIAGNOSIS — E876 Hypokalemia: Secondary | ICD-10-CM | POA: Diagnosis not present

## 2017-07-28 DIAGNOSIS — Z888 Allergy status to other drugs, medicaments and biological substances status: Secondary | ICD-10-CM

## 2017-07-28 DIAGNOSIS — N319 Neuromuscular dysfunction of bladder, unspecified: Secondary | ICD-10-CM | POA: Diagnosis not present

## 2017-07-28 DIAGNOSIS — G20 Parkinson's disease: Secondary | ICD-10-CM | POA: Diagnosis present

## 2017-07-28 DIAGNOSIS — L02212 Cutaneous abscess of back [any part, except buttock]: Secondary | ICD-10-CM | POA: Diagnosis present

## 2017-07-28 DIAGNOSIS — Z887 Allergy status to serum and vaccine status: Secondary | ICD-10-CM

## 2017-07-28 DIAGNOSIS — Z79899 Other long term (current) drug therapy: Secondary | ICD-10-CM

## 2017-07-28 DIAGNOSIS — Z87891 Personal history of nicotine dependence: Secondary | ICD-10-CM | POA: Diagnosis not present

## 2017-07-28 DIAGNOSIS — F329 Major depressive disorder, single episode, unspecified: Secondary | ICD-10-CM | POA: Diagnosis present

## 2017-07-28 DIAGNOSIS — L03312 Cellulitis of back [any part except buttock]: Principal | ICD-10-CM | POA: Diagnosis present

## 2017-07-28 DIAGNOSIS — F0390 Unspecified dementia without behavioral disturbance: Secondary | ICD-10-CM | POA: Diagnosis not present

## 2017-07-28 DIAGNOSIS — G912 (Idiopathic) normal pressure hydrocephalus: Secondary | ICD-10-CM | POA: Diagnosis present

## 2017-07-28 DIAGNOSIS — E785 Hyperlipidemia, unspecified: Secondary | ICD-10-CM | POA: Diagnosis not present

## 2017-07-28 HISTORY — DX: (Idiopathic) normal pressure hydrocephalus: G91.2

## 2017-07-28 HISTORY — DX: Cutaneous abscess, unspecified: L02.91

## 2017-07-28 LAB — COMPREHENSIVE METABOLIC PANEL
ALK PHOS: 68 U/L (ref 38–126)
ALT: 5 U/L — ABNORMAL LOW (ref 17–63)
ANION GAP: 5 (ref 5–15)
AST: 14 U/L — ABNORMAL LOW (ref 15–41)
Albumin: 3.8 g/dL (ref 3.5–5.0)
BILIRUBIN TOTAL: 0.9 mg/dL (ref 0.3–1.2)
BUN: 26 mg/dL — AB (ref 6–20)
CALCIUM: 8.9 mg/dL (ref 8.9–10.3)
CO2: 28 mmol/L (ref 22–32)
Chloride: 102 mmol/L (ref 101–111)
Creatinine, Ser: 0.86 mg/dL (ref 0.61–1.24)
GFR calc Af Amer: >60 mL/min (ref >60)
Glucose, Bld: 95 mg/dL (ref 65–99)
POTASSIUM: 3.8 mmol/L (ref 3.5–5.1)
Sodium: 135 mmol/L (ref 135–145)
Total Protein: 6.9 g/dL (ref 6.5–8.1)

## 2017-07-28 LAB — CBC WITH DIFFERENTIAL/PLATELET
BASOS PCT: 1 %
Basophils Absolute: 0 10*3/uL (ref 0–0.1)
EOS PCT: 3 %
Eosinophils Absolute: 0.2 10*3/uL (ref 0–0.7)
HCT: 32.4 % — ABNORMAL LOW (ref 40.0–52.0)
Hemoglobin: 10.8 g/dL — ABNORMAL LOW (ref 13.0–18.0)
LYMPHS PCT: 4 %
Lymphs Abs: 0.3 10*3/uL — ABNORMAL LOW (ref 1.0–3.6)
MCH: 33.9 pg (ref 26.0–34.0)
MCHC: 33.2 g/dL (ref 32.0–36.0)
MCV: 102 fL — AB (ref 80.0–100.0)
MONO ABS: 0.7 10*3/uL (ref 0.2–1.0)
Monocytes Relative: 9 %
NEUTROS ABS: 6.6 10*3/uL — AB (ref 1.4–6.5)
Neutrophils Relative %: 83 %
PLATELETS: 90 10*3/uL — AB (ref 150–440)
RBC: 3.18 MIL/uL — AB (ref 4.40–5.90)
RDW: 15.7 % — AB (ref 11.5–14.5)
WBC: 7.8 10*3/uL (ref 3.8–10.6)

## 2017-07-28 LAB — LACTIC ACID, PLASMA
LACTIC ACID, VENOUS: 0.9 mmol/L (ref 0.5–1.9)
Lactic Acid, Venous: 1.1 mmol/L (ref 0.5–1.9)

## 2017-07-28 LAB — T4, FREE: Free T4: 1.2 ng/dL — ABNORMAL HIGH (ref 0.61–1.12)

## 2017-07-28 MED ORDER — TRAZODONE HCL 50 MG PO TABS: 25.0000 mg | ORAL_TABLET | Freq: Every evening | ORAL | Status: DC | PRN

## 2017-07-28 MED ORDER — MEGESTROL ACETATE 20 MG PO TABS
20.0000 mg | ORAL_TABLET | Freq: Every day | ORAL | Status: DC
  Administered 2017-07-28 – 2017-07-30 (×3): 20 mg via ORAL
  Filled 2017-07-28: qty 0.5
  Filled 2017-07-28 (×3): qty 1

## 2017-07-28 MED ORDER — ACETAMINOPHEN 325 MG PO TABS: 650.0000 mg | ORAL_TABLET | Freq: Four times a day (QID) | ORAL | Status: DC | PRN

## 2017-07-28 MED ORDER — SODIUM CHLORIDE 0.9 % IV SOLN
INTRAVENOUS | Status: AC
  Administered 2017-07-28 – 2017-07-29 (×2): via INTRAVENOUS

## 2017-07-28 MED ORDER — PIPERACILLIN-TAZOBACTAM 3.375 G IVPB 30 MIN
3.3750 g | Freq: Once | INTRAVENOUS | Status: AC
  Administered 2017-07-28: 3.375 g via INTRAVENOUS
  Filled 2017-07-28: qty 50

## 2017-07-28 MED ORDER — OMEGA-3-ACID ETHYL ESTERS 1 G PO CAPS
2.0000 g | ORAL_CAPSULE | Freq: Every day | ORAL | Status: DC
  Administered 2017-07-28 – 2017-07-30 (×3): 2 g via ORAL
  Filled 2017-07-28 (×3): qty 2

## 2017-07-28 MED ORDER — ENOXAPARIN SODIUM 40 MG/0.4ML ~~LOC~~ SOLN
40.0000 mg | SUBCUTANEOUS | Status: DC
  Administered 2017-07-28 – 2017-07-29 (×2): 40 mg via SUBCUTANEOUS
  Filled 2017-07-28 (×2): qty 0.4

## 2017-07-28 MED ORDER — VANCOMYCIN HCL IN DEXTROSE 1-5 GM/200ML-% IV SOLN
1000.0000 mg | Freq: Once | INTRAVENOUS | Status: AC
  Administered 2017-07-28: 1000 mg via INTRAVENOUS
  Filled 2017-07-28: qty 200

## 2017-07-28 MED ORDER — SERTRALINE HCL 50 MG PO TABS
150.0000 mg | ORAL_TABLET | Freq: Every day | ORAL | Status: DC
  Administered 2017-07-29 – 2017-07-30 (×2): 150 mg via ORAL
  Filled 2017-07-28 (×2): qty 3

## 2017-07-28 MED ORDER — LIDOCAINE HCL (PF) 1 % IJ SOLN
5.0000 mL | Freq: Once | INTRAMUSCULAR | Status: DC
  Filled 2017-07-28 (×2): qty 5

## 2017-07-28 MED ORDER — HYDROCODONE-ACETAMINOPHEN 5-325 MG PO TABS
1.0000 | ORAL_TABLET | ORAL | Status: DC | PRN
  Administered 2017-07-28 – 2017-07-30 (×2): 1 via ORAL
  Filled 2017-07-28 (×2): qty 1

## 2017-07-28 MED ORDER — POLYETHYLENE GLYCOL 3350 17 G PO PACK
17.0000 g | PACK | Freq: Every day | ORAL | Status: DC
  Administered 2017-07-28 – 2017-07-30 (×3): 17 g via ORAL
  Filled 2017-07-28 (×3): qty 1

## 2017-07-28 MED ORDER — CARBIDOPA-LEVODOPA 25-100 MG PO TABS
2.0000 | ORAL_TABLET | Freq: Three times a day (TID) | ORAL | Status: DC
  Administered 2017-07-28 – 2017-07-30 (×6): 2 via ORAL
  Filled 2017-07-28 (×8): qty 2

## 2017-07-28 MED ORDER — VITAMIN B-12 1000 MCG PO TABS
500.0000 ug | ORAL_TABLET | Freq: Every day | ORAL | Status: DC
  Administered 2017-07-28 – 2017-07-30 (×3): 500 ug via ORAL
  Filled 2017-07-28 (×3): qty 1

## 2017-07-28 MED ORDER — DOXAZOSIN MESYLATE 4 MG PO TABS
8.0000 mg | ORAL_TABLET | Freq: Two times a day (BID) | ORAL | Status: DC
  Administered 2017-07-28 – 2017-07-30 (×4): 8 mg via ORAL
  Filled 2017-07-28 (×5): qty 2

## 2017-07-28 MED ORDER — ONDANSETRON HCL 4 MG/2ML IJ SOLN: 4.0000 mg | Freq: Four times a day (QID) | INTRAMUSCULAR | Status: DC | PRN

## 2017-07-28 MED ORDER — POTASSIUM CHLORIDE CRYS ER 20 MEQ PO TBCR
20.0000 meq | EXTENDED_RELEASE_TABLET | Freq: Two times a day (BID) | ORAL | Status: DC
  Administered 2017-07-28 – 2017-07-30 (×4): 20 meq via ORAL
  Filled 2017-07-28 (×4): qty 1

## 2017-07-28 MED ORDER — MAGNESIUM OXIDE 400 (241.3 MG) MG PO TABS
400.0000 mg | ORAL_TABLET | Freq: Every day | ORAL | Status: DC
  Administered 2017-07-28 – 2017-07-30 (×3): 400 mg via ORAL
  Filled 2017-07-28 (×3): qty 1

## 2017-07-28 MED ORDER — TIMOLOL MALEATE 0.5 % OP SOLN
1.0000 [drp] | Freq: Two times a day (BID) | OPHTHALMIC | Status: DC
  Administered 2017-07-28 – 2017-07-30 (×4): 1 [drp] via OPHTHALMIC
  Filled 2017-07-28: qty 5

## 2017-07-28 MED ORDER — POLYVINYL ALCOHOL 1.4 % OP SOLN
1.0000 [drp] | Freq: Every day | OPHTHALMIC | Status: DC
  Administered 2017-07-28 – 2017-07-30 (×3): 1 [drp] via OPHTHALMIC
  Filled 2017-07-28: qty 15

## 2017-07-28 MED ORDER — ONDANSETRON HCL 4 MG PO TABS: 4.0000 mg | ORAL_TABLET | Freq: Four times a day (QID) | ORAL | Status: DC | PRN

## 2017-07-28 MED ORDER — CO Q-10 100 MG PO CAPS: 100.0000 mg | ORAL_CAPSULE | Freq: Every day | ORAL | Status: DC

## 2017-07-28 MED ORDER — DONEPEZIL HCL 5 MG PO TABS
10.0000 mg | ORAL_TABLET | Freq: Every day | ORAL | Status: DC
  Administered 2017-07-28 – 2017-07-29 (×2): 10 mg via ORAL
  Filled 2017-07-28: qty 2
  Filled 2017-07-28: qty 1
  Filled 2017-07-28: qty 2

## 2017-07-28 MED ORDER — VITAMIN D 1000 UNITS PO TABS
2000.0000 [IU] | ORAL_TABLET | Freq: Every day | ORAL | Status: DC
  Administered 2017-07-28 – 2017-07-30 (×3): 2000 [IU] via ORAL
  Filled 2017-07-28 (×3): qty 2

## 2017-07-28 MED ORDER — BENAZEPRIL HCL 20 MG PO TABS
20.0000 mg | ORAL_TABLET | Freq: Two times a day (BID) | ORAL | Status: DC
  Administered 2017-07-28 – 2017-07-30 (×4): 20 mg via ORAL
  Filled 2017-07-28 (×5): qty 1

## 2017-07-28 MED ORDER — MIRABEGRON ER 50 MG PO TB24
100.0000 mg | ORAL_TABLET | Freq: Every day | ORAL | Status: DC
  Administered 2017-07-28 – 2017-07-30 (×3): 100 mg via ORAL
  Filled 2017-07-28 (×4): qty 2

## 2017-07-28 MED ORDER — CALCIUM CARBONATE-VITAMIN D 500-200 MG-UNIT PO TABS
1.0000 | ORAL_TABLET | Freq: Every day | ORAL | Status: DC
  Administered 2017-07-28 – 2017-07-30 (×3): 1 via ORAL
  Filled 2017-07-28 (×3): qty 1

## 2017-07-28 MED ORDER — FLUTICASONE PROPIONATE 50 MCG/ACT NA SUSP
2.0000 | Freq: Every day | NASAL | Status: DC
  Administered 2017-07-28 – 2017-07-30 (×3): 2 via NASAL
  Filled 2017-07-28: qty 16

## 2017-07-28 MED ORDER — ACETAMINOPHEN 650 MG RE SUPP: 650.0000 mg | Freq: Four times a day (QID) | RECTAL | Status: DC | PRN

## 2017-07-28 NOTE — Progress Notes (Signed)

## 2017-07-28 NOTE — H&P (Signed)
[facility] - [place] [facility]   PATIENT NAME: Perry [name]    MR#:  [id number]  DATE OF BIRTH:  June 06, 1929  DATE OF ADMISSION:  07/28/2017  PRIMARY CARE PHYSICIAN: Lynnea Ferrier, MD   REQUESTING/REFERRING PHYSICIAN: Dr. Nita Sickle  CHIEF COMPLAINT:   Chief Complaint  Patient presents with  . Abscess    lower back    HISTORY OF PRESENT ILLNESS:  Perry [name]  is a 83 y.o. male with a known history of hypertension, hyperlipidemia, known history of right bundle branch block, normal pressure hydrocephalus and Parkinson's disease with history of falls, venous insufficiency in both lower extremities presents from [facility] nursing home secondary to an abscess in his lower back. Due to his recurrent falls in the last 2 months, he is mostly bedbound, ambulated with assistance only with the help of a walker.  He had a right sacral pressure ulcer that has healed recently.  His physical therapist noticed a swelling in his lower back without any drainage last week.  It has been increasing in size to the point that he started complaining of pain and went to see his PCP today.  PCP recommended admission to the hospital.  He denies any fevers or chills.  No nausea or vomiting.  Incision and drainage in the emergency room was done and a lot of purulent material was drained.  Significant septations were noted by ER physician. He is being admitted.  for IV antibiotics and surgical consult to see if he needs any further debridement.  PAST MEDICAL HISTORY:   Past Medical History:  Diagnosis Date  . Aortic insufficiency    a. 06/2017 Echo: Mild to mod AI.  Marland Kitchen Arthritis   . Diastolic dysfunction    a. 06/2017 Echo: EF 50-55%, basal to mid inf HK. Gr1 DD. mild to mod AI. Mild MR. sev dil LA. mild TR. PasP .  . Frequent falls   . Heart attack (HCC)   . History of bradycardia    a. beta blocker d/c'd 12/2016.  Marland Kitchen Hyperlipidemia   . Hypertension   . Mitral  regurgitation    a. 06/2017 Echo: Mild MR.  . NPH (normal pressure hydrocephalus)   . Parkinson's disease (HCC)   . S/P TURP   . Urinary incontinence   . Venous insufficiency of both lower extremities     PAST SURGICAL HISTORY:   Past Surgical History:  Procedure Laterality Date  . CHOLECYSTECTOMY    . HERNIA REPAIR    . HIP FUSION     lt  . KNEE SURGERY     rt knee  . PROSTATE SURGERY    . TONSILLECTOMY    . WRIST SURGERY      SOCIAL HISTORY:   Social History   Tobacco Use  . Smoking status: Former Smoker    Types: Cigars  . Smokeless tobacco: Never Used  Substance Use Topics  . Alcohol use: No    Alcohol/week: 0.0 oz    FAMILY HISTORY:   Family History  Problem Relation Age of Onset  . Healthy Mother   . Hypertension Father   . Ovarian cancer Sister   . Appendicitis Brother   . Hypertension Sister   . Heart disease Sister     DRUG ALLERGIES:   Allergies  Allergen Reactions  . Tetanus Toxoids Other (See Comments)    REACTION: " 50 CENT PIECE SIZE WELTS AND CLOSES MY EYES"  . Atorvastatin Other (See Comments)  REACTION: " MUSCLE WEAKNESS"  . Rosuvastatin Other (See Comments)    REACTION: " MUSCLE WEAKNESS"    REVIEW OF SYSTEMS:   Review of Systems  Constitutional: Negative for chills, fever, malaise/fatigue and weight loss.  HENT: Negative for ear discharge, ear pain, hearing loss and nosebleeds.   Eyes: Negative for blurred vision, double vision and photophobia.  Respiratory: Negative for cough, hemoptysis, shortness of breath and wheezing.   Cardiovascular: Positive for leg swelling. Negative for chest pain, palpitations and orthopnea.  Gastrointestinal: Negative for abdominal pain, constipation, diarrhea, heartburn, melena, nausea and vomiting.  Genitourinary: Negative for dysuria, frequency and urgency.  Musculoskeletal: Positive for back pain and falls. Negative for myalgias and neck pain.  Skin: Negative for rash.  Neurological:  Positive for weakness. Negative for dizziness, tingling, tremors, sensory change, speech change, focal weakness and headaches.  Endo/Heme/Allergies: Does not bruise/bleed easily.  Psychiatric/Behavioral: Negative for depression.    MEDICATIONS AT HOME:   Prior to Admission medications   Medication Sig Start Date End Date Taking? Authorizing Provider  benazepril (LOTENSIN) 20 MG tablet Take 20 mg by mouth 2 (two) times daily.      [provider]  calcium-vitamin D (OSCAL WITH D) 500-200 MG-UNIT per tablet Take 1 tablet by mouth daily.      [provider]  carbidopa-levodopa (SINEMET IR) 25-100 MG tablet Take 2 tablets by mouth 3 (three) times daily.  01/19/16 06/14/21  [provider]  Cholecalciferol (VITAMIN D) 2000 UNITS CAPS Take 2,000 Units by mouth daily.     [provider]  Coenzyme Q10 (CO Q-10) 100 MG CAPS Take 100 mg by mouth daily.     [provider]  cyanocobalamin (V-R VITAMIN B-12) 500 MCG tablet Take 500 mcg by mouth daily.     [provider]  donepezil (ARICEPT) 10 MG tablet Take 10 mg by mouth at bedtime. 12/23/16 06/14/21  [provider]  doxazosin (CARDURA) 8 MG tablet Take 1 tablet (8 mg total) by mouth 2 (two) times daily. 12/31/16   Antonieta Iba, MD  fish oil-omega-3 fatty acids 1000 MG capsule Take 2 g by mouth daily.      [provider]  fluticasone (FLONASE) 50 MCG/ACT nasal spray Place 2 sprays into both nostrils daily.    [provider]  HYDROcodone-acetaminophen (NORCO/VICODIN) 5-325 MG tablet Take 1 tablet by mouth every 4 (four) hours as needed for moderate pain. 11/29/15   Cuthriell, Delorise Royals, PA-C  magnesium oxide (MAG-OX) 400 MG tablet Take 400 mg by mouth daily.    [provider]  megestrol (MEGACE) 20 MG tablet Take 20 mg by mouth daily.    [provider]  mirabegron ER (MYRBETRIQ) 50 MG TB24 Take 100 mg by mouth daily.     [provider]    Polyethyl Glyc-Propyl Glyc PF 0.4-0.3 % SOLN Apply 1 drop to eye daily.    [provider]  polyethylene glycol (MIRALAX / GLYCOLAX) packet Take 17 g by mouth daily.    [provider]  potassium chloride SA (K-DUR,KLOR-CON) 20 MEQ tablet Take 20 mEq by mouth 2 (two) times daily.    [provider]  sertraline (ZOLOFT) 50 MG tablet Take 150 mg by mouth daily.  01/19/16 06/14/21  [provider]  tadalafil (CIALIS) 20 MG tablet Take 20 mg by mouth daily as needed. (erectile dysfunction) 01/02/15   [provider]  timolol (TIMOPTIC) 0.5 % ophthalmic solution Place 1 drop into the left eye  2 (two) times daily.     [provider]  traZODone (DESYREL) 50 MG tablet Take 25 mg by mouth at bedtime as needed for sleep.    [provider]      VITAL SIGNS:  Blood pressure (!) 145/63, pulse 68, temperature 98.8 F (37.1 C), temperature source Oral, resp. rate 14, height 5\' 8"  (1.727 m), weight 87.5 kg (193 lb), SpO2 99 %.  PHYSICAL EXAMINATION:   Physical Exam  GENERAL:  83 y.o.-year-old patient lying in the bed with no acute distress.  EYES: Pupils equal, round, reactive to light and accommodation. No scleral icterus. Extraocular muscles intact.  HEENT: Head atraumatic, normocephalic. Oropharynx and nasopharynx clear.  NECK:  Supple, no jugular venous distention. No thyroid enlargement, no tenderness.  LUNGS: Normal breath sounds bilaterally, no wheezing, rales,rhonchi or crepitation. No use of accessory muscles of respiration. Decreased bibasilar breath sounds CARDIOVASCULAR: S1, S2 normal. No murmurs, rubs, or gallops.  ABDOMEN: Soft, nontender, nondistended. Bowel sounds present. No organomegaly or mass.  EXTREMITIES: chronic skin discoloration of both legs, knee down, 1+ bilateral leg edema. No cyanosis, or clubbing.  NEUROLOGIC: Cranial nerves II through XII are intact. Muscle strength 5/5 in all extremities. Sensation intact. Gait  not checked. Global weakness PSYCHIATRIC: The patient is alert and oriented x 3.  SKIN: No obvious rash, lesion, or ulcer.   LABORATORY PANEL:   CBC Recent Labs  Lab 07/28/17 1545  WBC 7.8  HGB 10.8*  HCT 32.4*  PLT 90*   ------------------------------------------------------------------------------------------------------------------  Chemistries  Recent Labs  Lab 07/28/17 1545  NA 135  K 3.8  CL 102  CO2 28  GLUCOSE 95  BUN 26*  CREATININE 0.86  CALCIUM 8.9  AST 14*  ALT 5*  ALKPHOS 68  BILITOT 0.9   ------------------------------------------------------------------------------------------------------------------  Cardiac Enzymes No results for input(s): TROPONINI in the last 168 hours. ------------------------------------------------------------------------------------------------------------------  RADIOLOGY:  No results found.  EKG:   Orders placed or performed during the hospital encounter of 07/16/17  . ED EKG  . ED EKG  . EKG 12-Lead  . EKG 12-Lead    IMPRESSION AND PLAN:   Wilburn MylarLacy Rasnick  is a 83 y.o. male with a known history of hypertension, hyperlipidemia, known history of right bundle branch block, normal pressure hydrocephalus and Parkinson's disease with history of falls, venous insufficiency in both lower extremities [facility] secondary to an abscess in his lower back.  1. Abscess of lower back-about the sacral region, lower thoracic region.  Could be pressure from sitting in the wheelchair. -Incision and drainage and purulent material drained.  Unfortunately was not sent for cultures. -Wound cultures ordered from the wound at this time. -IV vancomycin and Zosyn. -Wound care nurse consult and also surgical consult to see if patient needs further debridement.  2.  Recurrent falls-continue disease and also normal pressure hydrocephalus. He is looking for long-term care at this point. Social Worker consulted  continue his Sinemet for Parkinson's disease  3.  Hypertension-continue home meds.  Patient on benazepril.  Also likely for his hypokalemia.  Monitor potassium levels as he is on benazepril and potassium supplements.  4.  Neurogenic bladder-continue home medications  5.  Depression/early dementia-on Aricept, Zoloft.  .  DVT prophylaxis-Lovenox  Physical Therapy consulted    All the records are reviewed and case discussed with ED provider. Management plans discussed with the patient, family and they are in agreement.  CODE STATUS: Full Code  TOTAL TIME TAKING CARE OF THIS  PATIENT: 50 minutes.    Enid BaasKALISETTI,Damichael Hofman M.D on 07/28/2017 at 6:15 PM  Between 7am to 6pm - Pager - [phone]  After 6pm go to [email or web address] - password [email or web address]  [facility]  Office  [phone]  CC: Primary care physician; [name], Bert J III, MD

## 2017-07-28 NOTE — ED Triage Notes (Addendum)
Patient [facility] from [facility] for open abscess on lower back that has bloody drainage and redness around area. Pain only with movement or when abscess is touching something. PCP sent patient here. Patient diagnosed with lower body parkinson's and hydrocephalus

## 2017-07-28 NOTE — ED Notes (Signed)
Sacral dressing applied to pt's lower back.

## 2017-07-28 NOTE — ED Provider Notes (Signed)
[facility] Emergency Department Provider Note  ____________________________________________  Time seen: Approximately 5:56 PM  I have reviewed the triage vital signs and the nursing notes.   HISTORY  Chief Complaint Abscess (lower back)   HPI Perry [name] is a 83 y.o. male who presents for evaluation of back abscess. Patient has been in rehabilitation and therefore unknown how long he has had this abscess in his back. Was noted by the staff today and sent to PCP who sent patient to the ED. Patient is complaining of pain that is mild unless it is being touch and then pain is severe, sharp and constant. No fever, no chills, no nausea, no vomiting. The abscess is located in his lower back midline.  Past Medical History:  Diagnosis Date  . Aortic insufficiency    a. 06/2017 Echo: Mild to mod AI.  Marland Kitchen. Arthritis   . Diastolic dysfunction    a. 06/2017 Echo: EF 50-55%, basal to mid inf HK. Gr1 DD. mild to mod AI. Mild MR. sev dil LA. mild TR. PasP 45mmHg.  . Frequent falls   . Heart attack (HCC)   . History of bradycardia    a. beta blocker d/c'd 12/2016.  Marland Kitchen. Hyperlipidemia   . Hypertension   . Mitral regurgitation    a. 06/2017 Echo: Mild MR.  . Parkinson's disease (HCC)   . S/P TURP   . Urinary incontinence   . Venous insufficiency of both lower extremities     Patient Active Problem List   Diagnosis Date Noted  . Recurrent falls 07/16/2017  . New onset right bundle branch block (RBBB) 07/16/2017  . CAD (coronary artery disease) 06/14/2017  . AKI (acute kidney injury) (HCC) 06/14/2017  . Unresponsiveness 06/14/2017  . Thrombocytopenia (HCC) 06/14/2017  . Normocytic anemia 06/14/2017  . Depression 06/14/2017  . Parkinson's disease (HCC)   . Hypertension   . Hyperlipidemia   . Bradycardia 05/17/2016  . Benign hypertensive heart disease without heart failure 12/23/2012  . Dyspnea on exertion 08/25/2012  . Pedal edema 08/25/2012  . Heart  murmur 08/25/2012  . Right inguinal hernia 02/28/2011    Past Surgical History:  Procedure Laterality Date  . CHOLECYSTECTOMY    . HERNIA REPAIR    . HIP FUSION     lt  . KNEE SURGERY     rt knee  . PROSTATE SURGERY    . TONSILLECTOMY    . WRIST SURGERY      Prior to Admission medications   Medication Sig Start Date End Date Taking? Authorizing Provider  benazepril (LOTENSIN) 20 MG tablet Take 20 mg by mouth 2 (two) times daily.      [provider]  calcium-vitamin D (OSCAL WITH D) 500-200 MG-UNIT per tablet Take 1 tablet by mouth daily.      [provider]  carbidopa-levodopa (SINEMET IR) 25-100 MG tablet Take 2 tablets by mouth 3 (three) times daily.  01/19/16 06/14/21  [provider]  Cholecalciferol (VITAMIN D) 2000 UNITS CAPS Take 2,000 Units by mouth daily.     [provider]  Coenzyme Q10 (CO Q-10) 100 MG CAPS Take 100 mg by mouth daily.     [provider]  cyanocobalamin (V-R VITAMIN B-12) 500 MCG tablet Take 500 mcg by mouth daily.     [provider]  donepezil (ARICEPT) 10 MG tablet Take 10 mg by mouth at bedtime. 12/23/16 06/14/21  [provider]  doxazosin (CARDURA) 8 MG tablet Take 1 tablet (  8 mg total) by mouth 2 (two) times daily. 12/31/16   Antonieta Iba, MD  fish oil-omega-3 fatty acids 1000 MG capsule Take 2 g by mouth daily.      [provider]  fluticasone (FLONASE) 50 MCG/ACT nasal spray Place 2 sprays into both nostrils daily.    [provider]  HYDROcodone-acetaminophen (NORCO/VICODIN) 5-325 MG tablet Take 1 tablet by mouth every 4 (four) hours as needed for moderate pain. 11/29/15   Cuthriell, Delorise Royals, PA-C  magnesium oxide (MAG-OX) 400 MG tablet Take 400 mg by mouth daily.    [provider]  megestrol (MEGACE) 20 MG tablet Take 20 mg by mouth daily.    [provider]  mirabegron ER (MYRBETRIQ) 50 MG TB24 Take 100 mg by mouth daily.     [provider]  Polyethyl Glyc-Propyl Glyc PF 0.4-0.3 % SOLN Apply 1 drop to eye daily.    [provider]  polyethylene glycol (MIRALAX / GLYCOLAX) packet Take 17 g by mouth daily.    [provider]  potassium chloride SA (K-DUR,KLOR-CON) 20 MEQ tablet Take 20 mEq by mouth 2 (two) times daily.    [provider]  sertraline (ZOLOFT) 50 MG tablet Take 150 mg by mouth daily.  01/19/16 06/14/21  [provider]  tadalafil (CIALIS) 20 MG tablet Take 20 mg by mouth daily as needed. (erectile dysfunction) 01/02/15   [provider]  timolol (TIMOPTIC) 0.5 % ophthalmic solution Place 1 drop into the left eye 2 (two) times daily.     [provider]  traZODone (DESYREL) 50 MG tablet Take 25 mg by mouth at bedtime as needed for sleep.    [provider]    Allergies Tetanus toxoids; Atorvastatin; and Rosuvastatin  Family History  Problem Relation Age of Onset  . Healthy Mother   . Hypertension Father   . Ovarian cancer Sister   . Appendicitis Brother   . Hypertension Sister   . Heart disease Sister     Social History Social History   Tobacco Use  . Smoking status: Former Smoker    Types: Cigars  . Smokeless tobacco: Never Used  Substance Use Topics  . Alcohol use: No    Alcohol/week: 0.0 oz  . Drug use: Not on file    Review of Systems  Constitutional: Negative for fever. Eyes: Negative for visual changes. ENT: Negative for sore throat. Neck: No neck pain  Cardiovascular: Negative for chest pain. Respiratory: Negative for shortness of breath. Gastrointestinal: Negative for abdominal pain, vomiting or diarrhea. Genitourinary: Negative for dysuria. Musculoskeletal: Negative for back pain. Skin: Negative for rash. + abscess Neurological: Negative for headaches, weakness or numbness. Psych: No SI or HI  ____________________________________________   PHYSICAL EXAM:  VITAL SIGNS: ED Triage Vitals [07/28/17 1546]    Enc Vitals Group     BP (!) 145/63     Pulse Rate 68     Resp 14     Temp 98.8 F (37.1 C)     Temp Source Oral     SpO2 99 %     Weight 193 lb (87.5 kg)     Height 5\' 8"  (1.727 m)     Head Circumference      Peak Flow      Pain Score      Pain Loc      Pain Edu?      Excl. in GC?     Constitutional: Alert and oriented. Well appearing and  in no apparent distress. HEENT:      Head: Normocephalic and atraumatic.         Eyes: Conjunctivae are normal. Sclera is non-icteric.       Mouth/Throat: Mucous membranes are moist.       Neck: Supple with no signs of meningismus. Cardiovascular: Regular rate and rhythm. No murmurs, gallops, or rubs. 2+ symmetrical distal pulses are present in all extremities. No JVD. Respiratory: Normal respiratory effort. Lungs are clear to auscultation bilaterally. No wheezes, crackles, or rhonchi.  Gastrointestinal: Soft, non tender, and non distended with positive bowel sounds. No rebound or guarding. Musculoskeletal: Nontender with normal range of motion in all extremities. No edema, cyanosis, or erythema of extremities. Neurologic: Normal speech and language. Face is symmetric. Moving all extremities. No gross focal neurologic deficits are appreciated. Skin: There is a large 15cm abscess with significant cellulitis overlying it on patient lower back midline, part of the abcess has necrotic skin overlying it. Psychiatric: Mood and affect are normal. Speech and behavior are normal.  ____________________________________________   LABS (all labs ordered are listed, but only abnormal results are displayed)  Labs Reviewed  COMPREHENSIVE METABOLIC PANEL - Abnormal; Notable for the following components:      Result Value   BUN 26 (*)    AST 14 (*)    ALT 5 (*)    All other components within normal limits  CBC WITH DIFFERENTIAL/PLATELET - Abnormal; Notable for the following components:   RBC 3.18 (*)    Hemoglobin 10.8 (*)    HCT 32.4 (*)    MCV  102.0 (*)    RDW 15.7 (*)    Platelets 90 (*)    Neutro Abs 6.6 (*)    Lymphs Abs 0.3 (*)    All other components within normal limits  AEROBIC/ANAEROBIC CULTURE (SURGICAL/DEEP WOUND)  LACTIC ACID, PLASMA  LACTIC ACID, PLASMA  T4, FREE   ____________________________________________  EKG  none  ____________________________________________  RADIOLOGY  none  ____________________________________________   PROCEDURES  Procedure(s) performed:yes .Marland Kitchen.Incision and Drainage Date/Time: 07/28/2017 5:59 PM Performed by: Nita SickleVeronese, [place], MD Authorized by: Nita SickleVeronese, [place], MD   Consent:    Consent obtained:  Verbal   Consent given by:  Patient   Risks discussed:  Bleeding, incomplete drainage, pain, infection and damage to other organs Location:    Type:  Abscess   Size:  15   Location:  Trunk   Trunk location:  Back Pre-procedure details:    Skin preparation:  Chloraprep Anesthesia (see MAR for exact dosages):    Anesthesia method:  Local infiltration   Local anesthetic:  Lidocaine 1% w/o epi Procedure type:    Complexity:  Complex Procedure details:    Incision types:  Stab incision   Scalpel blade:  11   Wound management:  Probed and deloculated, irrigated with saline and extensive cleaning   Drainage:  Purulent   Drainage amount:  Copious   Wound treatment:  Wound left open   Packing materials:  None Post-procedure details:    Patient tolerance of procedure:  Tolerated well, no immediate complications   Critical Care performed:  None ____________________________________________   INITIAL IMPRESSION / ASSESSMENT AND PLAN / ED COURSE  83 y.o. male who presents for evaluation of back abscess. Patient found to have a complex large abscess with overlying necrotic skin and significant amount of cellulitis. Patient is hemodynamically stable with no signs of sepsis. Has a normal white count and afebrile. Abscess was I&D per procedure note above with  copious amount  of pus. Patient was started on zosyn and vancomycin and will be admitted to the Hospitalist service for close monitoring and possible OR debridement.      As part of my medical decision making, I reviewed the following data within the electronic MEDICAL RECORD NUMBER History obtained from family, Nursing notes reviewed and incorporated, Labs reviewed , Discussed with admitting physician , Notes from prior ED visits and [place] Controlled Substance Database    Pertinent labs & imaging results that were available during my care of the patient were reviewed by me and considered in my medical decision making (see chart for details).    ____________________________________________   FINAL CLINICAL IMPRESSION(S) / ED DIAGNOSES  Final diagnoses:  Abscess  Cellulitis of back except buttock      NEW MEDICATIONS STARTED DURING THIS VISIT:  ED Discharge Orders    None       Note:  This document was prepared using Dragon voice recognition software and may include unintentional dictation errors.    Nita Sickle, MD 07/28/17 [phone]

## 2017-07-29 DIAGNOSIS — L03312 Cellulitis of back [any part except buttock]: Secondary | ICD-10-CM | POA: Diagnosis not present

## 2017-07-29 DIAGNOSIS — L0291 Cutaneous abscess, unspecified: Secondary | ICD-10-CM

## 2017-07-29 LAB — BASIC METABOLIC PANEL
ANION GAP: 5 (ref 5–15)
BUN: 24 mg/dL — ABNORMAL HIGH (ref 6–20)
CALCIUM: 8.2 mg/dL — AB (ref 8.9–10.3)
CO2: 26 mmol/L (ref 22–32)
CREATININE: 0.83 mg/dL (ref 0.61–1.24)
Chloride: 104 mmol/L (ref 101–111)
Glucose, Bld: 90 mg/dL (ref 65–99)
Potassium: 3.8 mmol/L (ref 3.5–5.1)
SODIUM: 135 mmol/L (ref 135–145)

## 2017-07-29 LAB — CBC
HCT: 27.3 % — ABNORMAL LOW (ref 40.0–52.0)
HEMOGLOBIN: 9.3 g/dL — AB (ref 13.0–18.0)
MCH: 34 pg (ref 26.0–34.0)
MCHC: 34.1 g/dL (ref 32.0–36.0)
MCV: 99.7 fL (ref 80.0–100.0)
Platelets: 75 10*3/uL — ABNORMAL LOW (ref 150–440)
RBC: 2.74 MIL/uL — AB (ref 4.40–5.90)
RDW: 14.9 % — AB (ref 11.5–14.5)
WBC: 5.4 10*3/uL (ref 3.8–10.6)

## 2017-07-29 MED ORDER — SODIUM CHLORIDE 0.9 % IV SOLN
1500.0000 mg | Freq: Once | INTRAVENOUS | Status: DC
  Filled 2017-07-29: qty 1500

## 2017-07-29 MED ORDER — VANCOMYCIN HCL 10 G IV SOLR
1500.0000 mg | INTRAVENOUS | Status: DC
  Filled 2017-07-29: qty 1500

## 2017-07-29 MED ORDER — SENNA 8.6 MG PO TABS
2.0000 | ORAL_TABLET | Freq: Two times a day (BID) | ORAL | Status: DC
  Administered 2017-07-29 – 2017-07-30 (×2): 17.2 mg via ORAL
  Filled 2017-07-29 (×2): qty 2

## 2017-07-29 MED ORDER — VANCOMYCIN HCL 10 G IV SOLR
1500.0000 mg | INTRAVENOUS | Status: DC
  Administered 2017-07-29 – 2017-07-30 (×2): 1500 mg via INTRAVENOUS
  Filled 2017-07-29 (×4): qty 1500

## 2017-07-29 MED ORDER — PIPERACILLIN-TAZOBACTAM 3.375 G IVPB
3.3750 g | Freq: Three times a day (TID) | INTRAVENOUS | Status: DC
  Administered 2017-07-29 – 2017-07-30 (×3): 3.375 g via INTRAVENOUS
  Filled 2017-07-29 (×3): qty 50

## 2017-07-29 NOTE — Evaluation (Signed)
Physical Therapy Evaluation Patient Details Name: Perry BaliLacy R [name] MRN: [id number] DOB: 10/31/1928 Today's Date: 07/29/2017   History of Present Illness  83 y/o male here from SNF with low back/flank wound/abscess.  PMH includes venous insufficiency, PD, mitral regurgitation, HTN, MI, frequent falls, Parkinson's Dz.  Clinical Impression  Pt very limited with what he is functionally able to do though with encouragement from PT/wife he was willing to participate and attempted to help with mobility/transfers. He clearly lacked confidence with standing/ambulation attempt and was only able to walk a few feet with extremely slow and labored steppage.     Follow Up Recommendations SNF    Equipment Recommendations       Recommendations for Other Services       Precautions / Restrictions Precautions Precautions: Fall Restrictions Weight Bearing Restrictions: No      Mobility  Bed Mobility Overal bed mobility: Needs Assistance Bed Mobility: Supine to Sit     Supine to sit: Max assist;Mod assist     General bed mobility comments: Pt showed good effort, but is quite limited with what he can do/how much he can assist  Transfers Overall transfer level: Needs assistance Equipment used: Rolling walker (2 wheeled) Transfers: Sit to/from Stand Sit to Stand: Mod assist         General transfer comment: Pt needed assist to initiate movement and then ultimately to get hips shifted forward/use walker appropriately  Ambulation/Gait Ambulation/Gait assistance: Min assist Ambulation Distance (Feet): 6 Feet Assistive device: Rolling walker (2 wheeled)       General Gait Details: Pt with very limited, poor ambulation.  Pt only able to take very slow, small steps with very cautious effort.    Stairs            Wheelchair Mobility    Modified Rankin (Stroke Patients Only)       Balance Overall balance assessment: Needs assistance;History of Falls Sitting-balance support:  Bilateral upper extremity supported Sitting balance-Leahy Scale: Fair     Standing balance support: Bilateral upper extremity supported Standing balance-Leahy Scale: Poor Standing balance comment: Pt highly reliant on the walker, poor confidence                             Pertinent Vitals/Pain Pain Assessment: 0-10 Pain Score: 4  Pain Location: low back wound    Home Living Family/patient expects to be discharged to:: Skilled nursing facility                      Prior Function Level of Independence: Needs assistance   Gait / Transfers Assistance Needed: Pt apparently has been able to ambulate 20 ft with PT at [facility]     Comments: pt has been getting weaker over the last few months.  pt lives at home with wifes assist but has been at rehab the last 6+ weeks.  Pt and wife have plans to move up to [place] near [place]in [place] they are moving into independent living.     Hand Dominance        Extremity/Trunk Assessment   Upper Extremity Assessment Upper Extremity Assessment: Generalized weakness    Lower Extremity Assessment Lower Extremity Assessment: Generalized weakness       Communication   Communication: No difficulties  Cognition Arousal/Alertness: Lethargic Behavior During Therapy: WFL for tasks assessed/performed Overall Cognitive Status: Within Functional Limits for tasks assessed  General Comments      Exercises     Assessment/Plan    PT Assessment Patient needs continued PT services  PT Problem List Decreased strength;Decreased range of motion;Decreased activity tolerance;Decreased balance;Decreased mobility;Decreased coordination;Decreased knowledge of use of DME;Decreased safety awareness       PT Treatment Interventions DME instruction;Gait training;Functional mobility training;Therapeutic activities;Therapeutic exercise;Balance training;Neuromuscular  re-education;Patient/family education    PT Goals (Current goals can be found in the Care Plan section)  Acute Rehab PT Goals Patient Stated Goal: go home PT Goal Formulation: With patient Time For Goal Achievement: 08/12/17    Frequency Min 2X/week   Barriers to discharge        Co-evaluation               AM-PAC PT "6 Clicks" Daily Activity  Outcome Measure Difficulty turning over in bed (including adjusting bedclothes, sheets and blankets)?: Unable Difficulty moving from lying on back to sitting on the side of the bed? : Unable Difficulty sitting down on and standing up from a chair with arms (e.g., wheelchair, bedside commode, etc,.)?: Unable Help needed moving to and from a bed to chair (including a wheelchair)?: A Lot Help needed walking in hospital room?: A Lot Help needed climbing 3-5 steps with a railing? : Total 6 Click Score: 8    End of Session Equipment Utilized During Treatment: Gait belt Activity Tolerance: Patient limited by fatigue Patient left: with chair alarm set;with call bell/phone within reach;with family/visitor present Nurse Communication: Mobility status PT Visit Diagnosis: Unsteadiness on feet (R26.81);Other abnormalities of gait and mobility (R26.89);Repeated falls (R29.6);Muscle weakness (generalized) (M62.81);History of falling (Z91.81);Difficulty in walking, not elsewhere classified (R26.2)    Time: 1116-1140 PT Time Calculation (min) (ACUTE ONLY): 24 min   Charges:   PT Evaluation $PT Eval Low Complexity: 1 Low     PT G Codes:   PT G-Codes **NOT FOR INPATIENT CLASS** Functional Assessment Tool Used: AM-PAC 6 Clicks Basic Mobility Functional Limitation: Mobility: Walking and moving around Mobility: Walking and Moving Around Current Status (Z6109(G8978): At least 80 percent but less than 100 percent impaired, limited or restricted Mobility: Walking and Moving Around Goal Status [phone](G8979): At least 1 percent but less than 20 percent impaired,  limited or restricted    Malachi ProGalen R Nethra [name], DPT 07/29/2017, 1:02 PM

## 2017-07-29 NOTE — Consult Note (Signed)
WOC Nurse wound consult note Reason for Consult: Infectious abscess to right lower back. Was I & D in the ED.  Is draining bloody purulence this morning.   Wound type:infectious Pressure Injury POA: NA Measurement: erythema:  12 cm x 12 cm induration and erythema.  Tender to touch.  4 cm slit noted in center of wound.   Wound bed:not assessed Drainage (amount, consistency, odor) bloody purulence  No odor Periwound:Induration, erythema Dressing procedure/placement/frequency: Surgical consult pending.  Will defer to surgical services.  Dry dressing place.  Would recommend packing with Iodoform packing strip and changing daily.  Surgical orders will supercede this once they have seen him.  Will not follow at this time.  Please re-consult if needed.  Maple HudsonKaren Buzz Axel RN BSN CWON Pager [phone]

## 2017-07-29 NOTE — Progress Notes (Signed)
[facility] [place] [facility]   PATIENT NAME: Perry MylarLacy Santistevan    MR#:  [id number]  DATE OF BIRTH:  12/26/1928  SUBJECTIVE:  CHIEF COMPLAINT:  Patient is out of bed to chair. Surgeries not recommending IND Wife and daughter at bedside  REVIEW OF SYSTEMS:  CONSTITUTIONAL: No fever, fatigue or weakness.  EYES: No blurred or double vision.  EARS, NOSE, AND THROAT: No tinnitus or ear pain.  RESPIRATORY: No cough, shortness of breath, wheezing or hemoptysis.  CARDIOVASCULAR: No chest pain, orthopnea, edema.  GASTROINTESTINAL: No nausea, vomiting, diarrhea or abdominal pain.  GENITOURINARY: No dysuria, hematuria.  ENDOCRINE: No polyuria, nocturia,  HEMATOLOGY: No anemia, easy bruising or bleeding SKIN: No rash or lesion. MUSCULOSKELETAL: No joint pain or arthritis.   NEUROLOGIC: No tingling, numbness, weakness.  PSYCHIATRY: No anxiety or depression.   DRUG ALLERGIES:   Allergies  Allergen Reactions  . Tetanus Toxoids Other (See Comments)    REACTION: " 50 CENT PIECE SIZE WELTS AND CLOSES MY EYES"  . Atorvastatin Other (See Comments)    REACTION: " MUSCLE WEAKNESS"  . Rosuvastatin Other (See Comments)    REACTION: " MUSCLE WEAKNESS"    VITALS:  Blood pressure (!) 132/58, pulse 63, temperature 98.2 F (36.8 C), temperature source Oral, resp. rate 18, height 5\' 8"  (1.727 m), weight 87.5 kg (193 lb), SpO2 98 %.  PHYSICAL EXAMINATION:  GENERAL:  83 y.o.-year-old patient lying in the bed with no acute distress.  EYES: Pupils equal, round, reactive to light and accommodation. No scleral icterus. Extraocular muscles intact.  HEENT: Head atraumatic, normocephalic. Oropharynx and nasopharynx clear.  NECK:  Supple, no jugular venous distention. No thyroid enlargement, no tenderness.  LUNGS: Normal breath sounds bilaterally, no wheezing, rales,rhonchi or crepitation. No use of accessory muscles of respiration.  CARDIOVASCULAR: S1, S2 normal. No murmurs, rubs,  or gallops.  ABDOMEN: Soft, nontender, nondistended. Bowel sounds present. No organomegaly or mass.  EXTREMITIES: No pedal edema, cyanosis, or clubbing.  NEUROLOGIC: Cranial nerves II through XII are intact. Generalized weakness Sensation intact. Gait not checked.  PSYCHIATRIC: The patient is alert and oriented x 3.  SKIN: Sacral ulcer     LABORATORY PANEL:   CBC Recent Labs  Lab 07/29/17 0453  WBC 5.4  HGB 9.3*  HCT 27.3*  PLT 75*   ------------------------------------------------------------------------------------------------------------------  Chemistries  Recent Labs  Lab 07/28/17 1545 07/29/17 0453  NA 135 135  K 3.8 3.8  CL 102 104  CO2 28 26  GLUCOSE 95 90  BUN 26* 24*  CREATININE 0.86 0.83  CALCIUM 8.9 8.2*  AST 14*  --   ALT 5*  --   ALKPHOS 68  --   BILITOT 0.9  --    ------------------------------------------------------------------------------------------------------------------  Cardiac Enzymes No results for input(s): TROPONINI in the last 168 hours. ------------------------------------------------------------------------------------------------------------------  RADIOLOGY:  No results found.  EKG:   Orders placed or performed during the hospital encounter of 07/16/17  . ED EKG  . ED EKG  . EKG 12-Lead  . EKG 12-Lead    ASSESSMENT AND PLAN:   Perry MylarLacy Broughton  is a 83 y.o. male with a known history of hypertension, hyperlipidemia, known history of right bundle branch block, normal pressure hydrocephalus and Parkinson's disease with history of falls, venous insufficiency in both lower extremities presents from [facility] nursing home secondary to an abscess in his lower back.  1. Abscess of lower back-about the sacral region, lower thoracic region.  Could be pressure from sitting in  the wheelchair. -Incision and drainage and purulent material drained a few days ago  Unfortunately was not sent for cultures. -Wound cultures ordered from the  wound at this time no growth so far -Surgery is recommending to continue IV vancomycin and Zosyn for the next 24 hours Not considering incision and drainage at this time. Can discharge the patient based on the culture results in a.m. -Wound care nurse following patient He is looking for long-term care at this point. Social Worker consulted continue his Sinemet for Parkinson's disease  3.  Hypertension-continue home meds.  Patient on benazepril.  Also likely for his hypokalemia.  Monitor potassium levels as he is on benazepril and potassium supplements.  4.  Neurogenic bladder-continue home medications  5.  Depression/early dementia-on Aricept, Zoloft.  .  DVT prophylaxis-Lovenox    Physical therapy is recommending skilled nursing facility. Social worker consulted   All the records are reviewed and case discussed with Care Management/Social Workerr. Management plans discussed with the patient, family and they are in agreement.  CODE STATUS: fc  TOTAL TIME TAKING CARE OF THIS PATIENT: 36 minutes.   POSSIBLE D/C IN am DAYS, DEPENDING ON CLINICAL CONDITION.  Note: This dictation was prepared with Dragon dictation along with smaller phrase technology. Any transcriptional errors that result from this process are unintentional.   Ramonita LabGouru, Amayrani Bennick M.D on 07/29/2017 at 4:49 PM  Between 7am to 6pm - Pager - [phone] After 6pm go to [email or web address] - password [email or web address] [facility]  [facility]  Office  [phone]  CC: Primary care physician; [name], Bert J III, MD

## 2017-07-29 NOTE — Consult Note (Signed)
Date of Consultation:  07/29/2017  Requesting Physician:  Enid Baasadhika Kalisetti, MD  Reason for Consultation:  Back abscess  History of Present Illness: Perry [name] is a 83 y.o. male who presented to the emergency room yesterday with a history of a low back abscess.  He underwent incision and drainage procedure by the ED physician and was admitted to the medical team for further evaluation.  Cultures were drawn which are currently pending.  He received a dose of IV vancomycin and Zosyn in the emergency room but has not been on antibiotics on the floor.  Currently the patient reports that he is feeling a little bit better compared to yesterday, however he still having pain on his back.  Denies any nausea or vomiting.  Denies any fevers or chills.  Past Medical History: Past Medical History:  Diagnosis Date  . Aortic insufficiency    a. 06/2017 Echo: Mild to mod AI.  Marland Kitchen. Arthritis   . Diastolic dysfunction    a. 06/2017 Echo: EF 50-55%, basal to mid inf HK. Gr1 DD. mild to mod AI. Mild MR. sev dil LA. mild TR. PasP 45mmHg.  . Frequent falls   . Heart attack (HCC)   . History of bradycardia    a. beta blocker d/c'd 12/2016.  Marland Kitchen. Hyperlipidemia   . Hypertension   . Mitral regurgitation    a. 06/2017 Echo: Mild MR.  . NPH (normal pressure hydrocephalus)   . Parkinson's disease (HCC)   . S/P TURP   . Urinary incontinence   . Venous insufficiency of both lower extremities      Past Surgical History: Past Surgical History:  Procedure Laterality Date  . CHOLECYSTECTOMY    . HERNIA REPAIR    . HIP FUSION     lt  . KNEE SURGERY     rt knee  . PROSTATE SURGERY    . TONSILLECTOMY    . WRIST SURGERY      Home Medications: Prior to Admission medications   Medication Sig Start Date End Date Taking? Authorizing Provider  benazepril (LOTENSIN) 20 MG tablet Take 20 mg by mouth 2 (two) times daily.      [provider]  calcium-vitamin D (OSCAL WITH D) 500-200 MG-UNIT per tablet  Take 1 tablet by mouth daily.      [provider]  carbidopa-levodopa (SINEMET IR) 25-100 MG tablet Take 2 tablets by mouth 3 (three) times daily.  01/19/16 06/14/21  [provider]  Cholecalciferol (VITAMIN D) 2000 UNITS CAPS Take 2,000 Units by mouth daily.     [provider]  Coenzyme Q10 (CO Q-10) 100 MG CAPS Take 100 mg by mouth daily.     [provider]  cyanocobalamin (V-R VITAMIN B-12) 500 MCG tablet Take 500 mcg by mouth daily.     [provider]  donepezil (ARICEPT) 10 MG tablet Take 10 mg by mouth at bedtime. 12/23/16 06/14/21  [provider]  doxazosin (CARDURA) 8 MG tablet Take 1 tablet (8 mg total) by mouth 2 (two) times daily. 12/31/16   Antonieta IbaGollan, Timothy J, MD  fish oil-omega-3 fatty acids 1000 MG capsule Take 2 g by mouth daily.      [provider]  fluticasone (FLONASE) 50 MCG/ACT nasal spray Place 2 sprays into both nostrils daily.    [provider]  HYDROcodone-acetaminophen (NORCO/VICODIN) 5-325 MG tablet Take 1 tablet by mouth every 4 (four) hours as needed for moderate pain. 11/29/15   Cuthriell, Delorise RoyalsJonathan D, PA-C  magnesium  oxide (MAG-OX) 400 MG tablet Take 400 mg by mouth daily.    [provider]  megestrol (MEGACE) 20 MG tablet Take 20 mg by mouth daily.    [provider]  mirabegron ER (MYRBETRIQ) 50 MG TB24 Take 100 mg by mouth daily.     [provider]  Polyethyl Glyc-Propyl Glyc PF 0.4-0.3 % SOLN Apply 1 drop to eye daily.    [provider]  polyethylene glycol (MIRALAX / GLYCOLAX) packet Take 17 g by mouth daily.    [provider]  potassium chloride SA (K-DUR,KLOR-CON) 20 MEQ tablet Take 20 mEq by mouth 2 (two) times daily.    [provider]  sertraline (ZOLOFT) 50 MG tablet Take 150 mg by mouth daily.  01/19/16 06/14/21  [provider]  tadalafil (CIALIS) 20 MG tablet Take 20 mg by mouth daily as needed. (erectile dysfunction)  01/02/15   [provider]  timolol (TIMOPTIC) 0.5 % ophthalmic solution Place 1 drop into the left eye 2 (two) times daily.     [provider]  traZODone (DESYREL) 50 MG tablet Take 25 mg by mouth at bedtime as needed for sleep.    [provider]    Allergies: Allergies  Allergen Reactions  . Tetanus Toxoids Other (See Comments)    REACTION: " 50 CENT PIECE SIZE WELTS AND CLOSES MY EYES"  . Atorvastatin Other (See Comments)    REACTION: " MUSCLE WEAKNESS"  . Rosuvastatin Other (See Comments)    REACTION: " MUSCLE WEAKNESS"    Social History:  reports that he has quit smoking. His smoking use included cigars. he has never used smokeless tobacco. He reports that he does not drink alcohol. His drug history is not on file.   Family History: Family History  Problem Relation Age of Onset  . Healthy Mother   . Hypertension Father   . Ovarian cancer Sister   . Appendicitis Brother   . Hypertension Sister   . Heart disease Sister     Review of Systems: Review of Systems  Constitutional: Negative for chills and fever.  Respiratory: Negative for shortness of breath.   Cardiovascular: Negative for chest pain.  Gastrointestinal: Negative for abdominal pain, nausea and vomiting.  Skin:       Pain and erythema on back    Physical Exam BP (!) 166/77 (BP Location: Left Arm)   Pulse 62   Temp 98.1 F (36.7 C) (Oral)   Resp 19   Ht 5\' 8"  (1.727 m)   Wt 87.5 kg (193 lb)   SpO2 98%   BMI 29.35 kg/m  CONSTITUTIONAL: No acute distress HEENT:  Normocephalic, atraumatic, extraocular motion intact. RESPIRATORY:  Lungs are clear, and breath sounds are equal bilaterally. Normal respiratory effort without pathologic use of accessory muscles. CARDIOVASCULAR: Heart is regular without murmurs, gallops, or rubs. GI: The abdomen is soft, nondistended, nontender.  SKIN:  S/p back abscess I&D, with incision about 4 cm long.  There is surrounding induration and  erythema, extending about 2 cm on all sides of incision.  No purulent drainage noted, but has some bloody drainage.  No other pockets of pus noted on exam and probing of wound.  Packed with 1/4 inch iodoform gauze.  PSYCH:  Alert and oriented to person, place and time. Affect is normal.  Laboratory Analysis: Results for orders placed or performed during the hospital encounter of 07/28/17 (from the past 24 hour(s))  Lactic acid, plasma     Status: None  Collection Time: 07/28/17  3:45 PM  Result Value Ref Range   Lactic Acid, Venous 0.9 0.5 - 1.9 mmol/L  Comprehensive metabolic panel     Status: Abnormal   Collection Time: 07/28/17  3:45 PM  Result Value Ref Range   Sodium 135 135 - 145 mmol/L   Potassium 3.8 3.5 - 5.1 mmol/L   Chloride 102 101 - 111 mmol/L   CO2 28 22 - 32 mmol/L   Glucose, Bld 95 65 - 99 mg/dL   BUN 26 (H) 6 - 20 mg/dL   Creatinine, Ser 1.61 0.61 - 1.24 mg/dL   Calcium 8.9 8.9 - 09.6 mg/dL   Total Protein 6.9 6.5 - 8.1 g/dL   Albumin 3.8 3.5 - 5.0 g/dL   AST 14 (L) 15 - 41 U/L   ALT 5 (L) 17 - 63 U/L   Alkaline Phosphatase 68 38 - 126 U/L   Total Bilirubin 0.9 0.3 - 1.2 mg/dL   GFR calc non Af Amer >60 >60 mL/min   GFR calc Af Amer >60 >60 mL/min   Anion gap 5 5 - 15  CBC with Differential     Status: Abnormal   Collection Time: 07/28/17  3:45 PM  Result Value Ref Range   WBC 7.8 3.8 - 10.6 K/uL   RBC 3.18 (L) 4.40 - 5.90 MIL/uL   Hemoglobin 10.8 (L) 13.0 - 18.0 g/dL   HCT 04.5 (L) 40.9 - 81.1 %   MCV 102.0 (H) 80.0 - 100.0 fL   MCH 33.9 26.0 - 34.0 pg   MCHC 33.2 32.0 - 36.0 g/dL   RDW 91.4 (H) 78.2 - 95.6 %   Platelets 90 (L) 150 - 440 K/uL   Neutrophils Relative % 83 %   Neutro Abs 6.6 (H) 1.4 - 6.5 K/uL   Lymphocytes Relative 4 %   Lymphs Abs 0.3 (L) 1.0 - 3.6 K/uL   Monocytes Relative 9 %   Monocytes Absolute 0.7 0.2 - 1.0 K/uL   Eosinophils Relative 3 %   Eosinophils Absolute 0.2 0 - 0.7 K/uL   Basophils Relative 1 %   Basophils Absolute 0.0  0 - 0.1 K/uL  T4, free     Status: Abnormal   Collection Time: 07/28/17  3:45 PM  Result Value Ref Range   Free T4 1.20 (H) 0.61 - 1.12 ng/dL  Aerobic/Anaerobic Culture (surgical/deep wound)     Status: None (Preliminary result)   Collection Time: 07/28/17  6:50 PM  Result Value Ref Range   Specimen Description WOUND    Special Requests NONE    Gram Stain      NO WBC SEEN NO ORGANISMS SEEN Performed at [facility], [address] [place], [place] [address]    Culture PENDING    Report Status PENDING   Lactic acid, plasma     Status: None   Collection Time: 07/28/17  7:46 PM  Result Value Ref Range   Lactic Acid, Venous 1.1 0.5 - 1.9 mmol/L  Basic metabolic panel     Status: Abnormal   Collection Time: 07/29/17  4:53 AM  Result Value Ref Range   Sodium 135 135 - 145 mmol/L   Potassium 3.8 3.5 - 5.1 mmol/L   Chloride 104 101 - 111 mmol/L   CO2 26 22 - 32 mmol/L   Glucose, Bld 90 65 - 99 mg/dL   BUN 24 (H) 6 - 20 mg/dL   Creatinine, Ser 6.57 0.61 - 1.24 mg/dL   Calcium 8.2 (  L) 8.9 - 10.3 mg/dL   GFR calc non Af Amer >60 >60 mL/min   GFR calc Af Amer >60 >60 mL/min   Anion gap 5 5 - 15  CBC     Status: Abnormal   Collection Time: 07/29/17  4:53 AM  Result Value Ref Range   WBC 5.4 3.8 - 10.6 K/uL   RBC 2.74 (L) 4.40 - 5.90 MIL/uL   Hemoglobin 9.3 (L) 13.0 - 18.0 g/dL   HCT 04.527.3 (L) 40.940.0 - 81.152.0 %   MCV 99.7 80.0 - 100.0 fL   MCH 34.0 26.0 - 34.0 pg   MCHC 34.1 32.0 - 36.0 g/dL   RDW 91.414.9 (H) 78.211.5 - 95.614.5 %   Platelets 75 (L) 150 - 440 K/uL    Imaging: No results found.  Assessment and Plan: This is a 83 y.o. male who presents with a back abscess, s/p I&D in the emergency room.  --Wound examined.  No appreciable remaining purulence, so for now does not require further I&D.  Agree with 1/4 inch iodoform gauze packing per wound RN recommendations. --Would recommend starting the patient on 24 hrs of IV antibiotics and transitioning to oral depending on culture  data.  Currently he is not on any antibiotics. --Will continue following with you to make sure wound continues to heal.  Discussed with patient that for now no procedures are needed.   Howie IllJose Luis Camara Renstrom, MD [facility]

## 2017-07-29 NOTE — Progress Notes (Signed)
Pharmacy Antibiotic Note  Perry [name] is a 83 y.o. male admitted on 07/28/2017 with Wound infection/Back abscess.  Pharmacy has been consulted for Vancomycin and Zosyn dosing.  Plan: Vancomycin 1500 IV every 18 hours.  Goal trough 15-20 mcg/mL. Zosyn 3.375g IV q8h (4 hour infusion).   Patient received Vancomycin 1 gram x 1 yesterday in ER and Zosyn x 1 dose in ER. Ke 0.056  t1/2 12.38  Vd 61.2    Vancomycin trough prior to 5th dose on 12/20  Height: 5\' 8"  (172.7 cm) Weight: 193 lb (87.5 kg) IBW/kg (Calculated) : 68.4  Temp (24hrs), Avg:98.5 F (36.9 C), Min:98.1 F (36.7 C), Max:98.8 F (37.1 C)  Recent Labs  Lab 07/28/17 1545 07/28/17 1946 07/29/17 0453  WBC 7.8  --  5.4  CREATININE 0.86  --  0.83  LATICACIDVEN 0.9 1.1  --     Estimated Creatinine Clearance: 68.7 mL/min (by C-G formula based on SCr of 0.83 mg/dL).    Allergies  Allergen Reactions  . Tetanus Toxoids Other (See Comments)    REACTION: " 50 CENT PIECE SIZE WELTS AND CLOSES MY EYES"  . Atorvastatin Other (See Comments)    REACTION: " MUSCLE WEAKNESS"  . Rosuvastatin Other (See Comments)    REACTION: " MUSCLE WEAKNESS"    Antimicrobials this admission: Zosyn 12/17 >>   Vanc 12/17 >>    Dose adjustments this admission:    Microbiology results:   BCx:     UCx:      Sputum:      MRSA PCR:   12/17 Wound cx: pending  Thank you for allowing pharmacy to be a part of this patient's care.  Jasie Meleski A 07/29/2017 11:32 AM

## 2017-07-30 DIAGNOSIS — L03312 Cellulitis of back [any part except buttock]: Secondary | ICD-10-CM | POA: Diagnosis not present

## 2017-07-30 DIAGNOSIS — L0291 Cutaneous abscess, unspecified: Secondary | ICD-10-CM | POA: Diagnosis not present

## 2017-07-30 LAB — CREATININE, SERUM
CREATININE: 0.95 mg/dL (ref 0.61–1.24)
GFR calc Af Amer: >60 mL/min (ref >60)
GFR calc non Af Amer: >60 mL/min (ref >60)

## 2017-07-30 MED ORDER — SULFAMETHOXAZOLE-TRIMETHOPRIM 800-160 MG PO TABS: 1.0000 | ORAL_TABLET | Freq: Two times a day (BID) | ORAL | 0 refills | Status: AC

## 2017-07-30 MED ORDER — SULFAMETHOXAZOLE-TRIMETHOPRIM 800-160 MG PO TABS: 1.0000 | ORAL_TABLET | Freq: Two times a day (BID) | ORAL | 0 refills | Status: DC

## 2017-07-30 NOTE — Clinical Social Work Note (Signed)
Patient's wife has decided to have patient transport via EMS. York SpanielMonica Denyla Cortese MSW,LCSW [phone]

## 2017-07-30 NOTE — Progress Notes (Signed)
07/30/2017  Subjective: No acute events.  Patient has been on antibiotics for his back abscess.  Reports pain in better.  No fevers.  Vital signs: Temp:  [98.2 F (36.8 C)-98.9 F (37.2 C)] 98.4 F (36.9 C) (12/19 0531) Pulse Rate:  [57-64] 57 (12/19 0531) Resp:  [18-20] 20 (12/19 0531) BP: (132-174)/(58-81) 174/70 (12/19 0531) SpO2:  [96 %-98 %] 96 % (12/19 0531)   Intake/Output: 12/18 0701 - 12/19 0700 In: 1707 [P.O.:480; I.V.:727; IV Piggyback:500] Out: 50 [Urine:50] Last BM Date: 07/30/17  Physical Exam: Constitutional: No acute distress Skin:  Improved induration and erythema around wound.  No purulent drainage.  Iodoform gauze packing in place.  Labs:  Recent Labs    07/28/17 1545 07/29/17 0453  WBC 7.8 5.4  HGB 10.8* 9.3*  HCT 32.4* 27.3*  PLT 90* 75*   Recent Labs    07/28/17 1545 07/29/17 0453 07/30/17 0536  NA 135 135  --   K 3.8 3.8  --   CL 102 104  --   CO2 28 26  --   GLUCOSE 95 90  --   BUN 26* 24*  --   CREATININE 0.86 0.83 0.95  CALCIUM 8.9 8.2*  --    No results for input(s): LABPROT, INR in the last 72 hours.  Imaging: No results found.  Assessment/Plan: 83 yo male with back abscess, s/p I&D by ED.  --No surgical needs at this point.  Wound continues to improve and there is no purulence. --May transition to oral antibiotics and discharge from surgical standpoint.  Will need 1/4 inch iodoform packing twice daily dressing changes.  May follow up with our office in 1-2 weeks for wound check.   Howie IllJose Luis Gianelle Mccaul, MD [facility]

## 2017-07-30 NOTE — Discharge Summary (Signed)
[facility] - [place] at [facility]   PATIENT NAME: Perry [name]    MR#:  [id number]  DATE OF BIRTH:  10/21/1928  DATE OF ADMISSION:  07/28/2017 ADMITTING PHYSICIAN: Enid Baasadhika Kalisetti, MD  DATE OF DISCHARGE: 07/30/2017  PRIMARY CARE PHYSICIAN: Curtis SitesKlein, Bert J III, MD    ADMISSION DIAGNOSIS:  Abscess [L02.91] Cellulitis of back except buttock [L03.312]  DISCHARGE DIAGNOSIS:  Active Problems:   Abscess   SECONDARY DIAGNOSIS:   Past Medical History:  Diagnosis Date  . Aortic insufficiency    a. 06/2017 Echo: Mild to mod AI.  Marland Kitchen. Arthritis   . Diastolic dysfunction    a. 06/2017 Echo: EF 50-55%, basal to mid inf HK. Gr1 DD. mild to mod AI. Mild MR. sev dil LA. mild TR. PasP 45mmHg.  . Frequent falls   . Heart attack (HCC)   . History of bradycardia    a. beta blocker d/c'd 12/2016.  Marland Kitchen. Hyperlipidemia   . Hypertension   . Mitral regurgitation    a. 06/2017 Echo: Mild MR.  . NPH (normal pressure hydrocephalus)   . Parkinson's disease (HCC)   . S/P TURP   . Urinary incontinence   . Venous insufficiency of both lower extremities     HOSPITAL COURSE:   83 y.o.malewith a known history of hypertension, hyperlipidemia, known history of right bundle branch block, normal pressure hydrocephalus and Parkinson's disease with history of falls, venous insufficiency in both lower extremities presents from [facility] nursing home secondary to an abscess in his lower back.  1. Abscess of lower back-about the sacral region, lower thoracic region. Could be pressure from sitting in the wheelchair. He is s/p Incision and drainage in the ED. Unfortunately this was not sent for cultures. He was evaluated by Surgery with no further recommendations for drainage as it seems to be doingwell without further surgical intervention. He will need bactrim at discharge and follow up with Surgery in 1 week. He will continue dressing changes with 1/4 inch IODOFORM packing BID.  3.  Hypertension: Continue outpatient medications 4. Neurogenic bladder-continue home medications  5. Depression/early dementia-Continue on Aricept, Zoloft.      DISCHARGE CONDITIONS AND DIET:   Stable Regular diet  CONSULTS OBTAINED:  Treatment Team:  Henrene DodgePiscoya, Jose, MD  DRUG ALLERGIES:   Allergies  Allergen Reactions  . Tetanus Toxoids Other (See Comments)    REACTION: " 50 CENT PIECE SIZE WELTS AND CLOSES MY EYES"  . Atorvastatin Other (See Comments)    REACTION: " MUSCLE WEAKNESS"  . Rosuvastatin Other (See Comments)    REACTION: " MUSCLE WEAKNESS"    DISCHARGE MEDICATIONS:   Allergies as of 07/30/2017      Reactions   Tetanus Toxoids Other (See Comments)   REACTION: " 50 CENT PIECE SIZE WELTS AND CLOSES MY EYES"   Atorvastatin Other (See Comments)   REACTION: " MUSCLE WEAKNESS"   Rosuvastatin Other (See Comments)   REACTION: " MUSCLE WEAKNESS"      Medication List    TAKE these medications   benazepril 20 MG tablet Commonly known as:  LOTENSIN Take 20 mg by mouth 2 (two) times daily.   calcium-vitamin D 500-200 MG-UNIT tablet Commonly known as:  OSCAL WITH D Take 1 tablet by mouth daily.   carbidopa-levodopa 25-100 MG tablet Commonly known as:  SINEMET IR Take 2 tablets by mouth 3 (three) times daily.   carboxymethylcellulose 0.5 % Soln Commonly known as:  REFRESH PLUS Place 1 drop into both eyes 4 (four)  times daily.   CIALIS 20 MG tablet Generic drug:  tadalafil Take 20 mg by mouth daily as needed. (erectile dysfunction)   Co Q-10 100 MG Caps Take 100 mg by mouth daily.   donepezil 10 MG tablet Commonly known as:  ARICEPT Take 10 mg by mouth at bedtime.   doxazosin 8 MG tablet Commonly known as:  CARDURA Take 1 tablet (8 mg total) by mouth 2 (two) times daily.   fish oil-omega-3 fatty acids 1000 MG capsule Take 2 g by mouth daily.   fluticasone 50 MCG/ACT nasal spray Commonly known as:  FLONASE Place 2 sprays into both nostrils  daily.   HYDROcodone-acetaminophen 5-325 MG tablet Commonly known as:  NORCO/VICODIN Take 1 tablet by mouth every 4 (four) hours as needed for moderate pain.   magnesium oxide 400 MG tablet Commonly known as:  MAG-OX Take 400 mg by mouth daily.   megestrol 20 MG tablet Commonly known as:  MEGACE Take 20 mg by mouth daily.   MYRBETRIQ 50 MG Tb24 tablet Generic drug:  mirabegron ER Take 100 mg by mouth daily.   Polyethyl Glyc-Propyl Glyc PF 0.4-0.3 % Soln Place 1 drop into both eyes 2 (two) times daily.   polyethylene glycol packet Commonly known as:  MIRALAX / GLYCOLAX Take 17 g by mouth daily.   potassium chloride SA 20 MEQ tablet Commonly known as:  K-DUR,KLOR-CON Take 20 mEq by mouth 2 (two) times daily.   sertraline 50 MG tablet Commonly known as:  ZOLOFT Take 150 mg by mouth daily.   sodium chloride 0.65 % Soln nasal spray Commonly known as:  OCEAN Place 1-2 sprays into both nostrils as needed for congestion.   sulfamethoxazole-trimethoprim 800-160 MG tablet Commonly known as:  BACTRIM DS,SEPTRA DS Take 1 tablet by mouth 2 (two) times daily.   timolol 0.5 % ophthalmic solution Commonly known as:  TIMOPTIC Place 1 drop into the left eye 2 (two) times daily.   traZODone 50 MG tablet Commonly known as:  DESYREL Take 25 mg by mouth at bedtime.   V-R VITAMIN B-12 500 MCG tablet Generic drug:  cyanocobalamin Take 500 mcg by mouth daily.   Vitamin D 2000 units Caps Take 2,000 Units by mouth daily.            Discharge Care Instructions  (From admission, onward)        Start     Ordered   07/30/17 0000  Discharge wound care:    Comments:  Would recommend packing with Iodoform packing strip and changing daily   07/30/17 0930        Today   CHIEF COMPLAINT:   No acute events overnight   VITAL SIGNS:  Blood pressure (!) 174/70, pulse (!) 57, temperature 98.4 F (36.9 C), temperature source Oral, resp. rate 20, height 5\' 8"  (1.727 m), weight  87.5 kg (193 lb), SpO2 96 %.   REVIEW OF SYSTEMS:  Review of Systems  Constitutional: Negative.  Negative for chills, fever and malaise/fatigue.  HENT: Negative.  Negative for ear discharge, ear pain, hearing loss, nosebleeds and sore throat.   Eyes: Negative.  Negative for blurred vision and pain.  Respiratory: Negative.  Negative for cough, hemoptysis, shortness of breath and wheezing.   Cardiovascular: Negative.  Negative for chest pain, palpitations and leg swelling.  Gastrointestinal: Negative.  Negative for abdominal pain, blood in stool, diarrhea, nausea and vomiting.  Genitourinary: Negative.  Negative for dysuria.  Musculoskeletal: Negative.  Negative for back pain.  Neurological:  Negative for dizziness, tremors, speech change, focal weakness, seizures and headaches.  Endo/Heme/Allergies: Negative.  Does not bruise/bleed easily.  Psychiatric/Behavioral: Negative.  Negative for depression, hallucinations and suicidal ideas.     PHYSICAL EXAMINATION:  GENERAL:  83 y.o.-year-old patient lying in the bed with no acute distress.  NECK:  Supple, no jugular venous distention. No thyroid enlargement, no tenderness.  LUNGS: Normal breath sounds bilaterally, no wheezing, rales,rhonchi  No use of accessory muscles of respiration.  CARDIOVASCULAR: S1, S2 normal. No murmurs, rubs, or gallops.  ABDOMEN: Soft, non-tender, non-distended. Bowel sounds present. No organomegaly or mass.  EXTREMITIES: No pedal edema, cyanosis, or clubbing.  PSYCHIATRIC: The patient is alert and oriented x 3.  SKIN: s/p I an d covered this am   DATA REVIEW:   CBC Recent Labs  Lab 07/29/17 0453  WBC 5.4  HGB 9.3*  HCT 27.3*  PLT 75*    Chemistries  Recent Labs  Lab 07/28/17 1545 07/29/17 0453 07/30/17 0536  NA 135 135  --   K 3.8 3.8  --   CL 102 104  --   CO2 28 26  --   GLUCOSE 95 90  --   BUN 26* 24*  --   CREATININE 0.86 0.83 0.95  CALCIUM 8.9 8.2*  --   AST 14*  --   --   ALT 5*  --    --   ALKPHOS 68  --   --   BILITOT 0.9  --   --     Cardiac Enzymes No results for input(s): TROPONINI in the last 168 hours.  Microbiology Results  @MICRORSLT48 @  RADIOLOGY:  No results found.    Allergies as of 07/30/2017      Reactions   Tetanus Toxoids Other (See Comments)   REACTION: " 50 CENT PIECE SIZE WELTS AND CLOSES MY EYES"   Atorvastatin Other (See Comments)   REACTION: " MUSCLE WEAKNESS"   Rosuvastatin Other (See Comments)   REACTION: " MUSCLE WEAKNESS"      Medication List    TAKE these medications   benazepril 20 MG tablet Commonly known as:  LOTENSIN Take 20 mg by mouth 2 (two) times daily.   calcium-vitamin D 500-200 MG-UNIT tablet Commonly known as:  OSCAL WITH D Take 1 tablet by mouth daily.   carbidopa-levodopa 25-100 MG tablet Commonly known as:  SINEMET IR Take 2 tablets by mouth 3 (three) times daily.   carboxymethylcellulose 0.5 % Soln Commonly known as:  REFRESH PLUS Place 1 drop into both eyes 4 (four) times daily.   CIALIS 20 MG tablet Generic drug:  tadalafil Take 20 mg by mouth daily as needed. (erectile dysfunction)   Co Q-10 100 MG Caps Take 100 mg by mouth daily.   donepezil 10 MG tablet Commonly known as:  ARICEPT Take 10 mg by mouth at bedtime.   doxazosin 8 MG tablet Commonly known as:  CARDURA Take 1 tablet (8 mg total) by mouth 2 (two) times daily.   fish oil-omega-3 fatty acids 1000 MG capsule Take 2 g by mouth daily.   fluticasone 50 MCG/ACT nasal spray Commonly known as:  FLONASE Place 2 sprays into both nostrils daily.   HYDROcodone-acetaminophen 5-325 MG tablet Commonly known as:  NORCO/VICODIN Take 1 tablet by mouth every 4 (four) hours as needed for moderate pain.   magnesium oxide 400 MG tablet Commonly known as:  MAG-OX Take 400 mg by mouth daily.   megestrol 20 MG tablet Commonly known as:  MEGACE Take 20 mg by mouth daily.   MYRBETRIQ 50 MG Tb24 tablet Generic drug:  mirabegron ER Take 100  mg by mouth daily.   Polyethyl Glyc-Propyl Glyc PF 0.4-0.3 % Soln Place 1 drop into both eyes 2 (two) times daily.   polyethylene glycol packet Commonly known as:  MIRALAX / GLYCOLAX Take 17 g by mouth daily.   potassium chloride SA 20 MEQ tablet Commonly known as:  K-DUR,KLOR-CON Take 20 mEq by mouth 2 (two) times daily.   sertraline 50 MG tablet Commonly known as:  ZOLOFT Take 150 mg by mouth daily.   sodium chloride 0.65 % Soln nasal spray Commonly known as:  OCEAN Place 1-2 sprays into both nostrils as needed for congestion.   sulfamethoxazole-trimethoprim 800-160 MG tablet Commonly known as:  BACTRIM DS,SEPTRA DS Take 1 tablet by mouth 2 (two) times daily.   timolol 0.5 % ophthalmic solution Commonly known as:  TIMOPTIC Place 1 drop into the left eye 2 (two) times daily.   traZODone 50 MG tablet Commonly known as:  DESYREL Take 25 mg by mouth at bedtime.   V-R VITAMIN B-12 500 MCG tablet Generic drug:  cyanocobalamin Take 500 mcg by mouth daily.   Vitamin D 2000 units Caps Take 2,000 Units by mouth daily.            Discharge Care Instructions  (From admission, onward)        Start     Ordered   07/30/17 0000  Discharge wound care:    Comments:  Would recommend packing with Iodoform packing strip and changing daily   07/30/17 0930        Management plans discussed with the patient and wife and they are in agreement. Stable for discharge   Patient should follow up with pcp  CODE STATUS:     Code Status Orders  (From admission, onward)        Start     Ordered   07/28/17 1941  Full code  Continuous     07/28/17 1940    Code Status History    Date Active Date Inactive Code Status Order ID Comments User Context   07/17/2017 00:21 07/17/2017 17:46 Full Code [id number]  Oralia ManisWillis, David, MD Inpatient   06/14/2017 23:32 06/16/2017 21:44 Full Code [id number]  Lorretta HarpNiu, Xilin, MD ED    Advance Directive Documentation     Most Recent Value  Type of  Advance Directive  Healthcare Power of Attorney, Living will  Pre-existing out of facility DNR order (yellow form or pink MOST form)  No data  "MOST" Form in Place?  No data      TOTAL TIME TAKING CARE OF THIS PATIENT: 37 minutes.    Note: This dictation was prepared with Dragon dictation along with smaller phrase technology. Any transcriptional errors that result from this process are unintentional.  Kizzi Overbey M.D on 07/30/2017 at 9:30 AM  Between 7am to 6pm - Pager - [phone] After 6pm go to [email or web address] - password [email or web address]  [facility]  Office  [phone]  CC: Primary care physician; [name], Bert J III, MD

## 2017-07-30 NOTE — Care Management Important Message (Signed)
Important Message  Patient Details  Name: Perry [name] MRN: [id number] Date of Birth: 08/02/1929   Medicare Important Message Given:  N/A - LOS <3 / Initial given by admissions    Chapman FitchBOWEN, Cherly Erno T, RN 07/30/2017, 10:17 AM

## 2017-07-30 NOTE — Care Management (Signed)
Orders placed for home health.  Patient is returning to facility.  RNCM signing off.

## 2017-07-30 NOTE — Progress Notes (Signed)
EMS called. IV Removed. Attempted to call report x3. It rang to voicemail each time. Discharge instructions given to pt and wife at bedside. All questions answered.

## 2017-07-30 NOTE — Clinical Social Work Note (Signed)
Clinical Social Work Assessment  Patient Details  Name: Perry [name] MRN: [id number] Date of Birth: 04/02/1929  Date of referral:  07/30/17               Reason for consult:  Discharge Planning                Permission sought to share information with:    Permission granted to share information::     Name::        Agency::     Relationship::     Contact Information:     Housing/Transportation Living arrangements for the past 2 months:  Skilled Nursing Facility Source of Information:  Adult Children Patient Interpreter Needed:    Criminal Activity/Legal Involvement Pertinent to Current Situation/Hospitalization:  No - Comment as needed Significant Relationships:  Adult Children, Spouse Lives with:  Facility Resident Do you feel safe going back to the place where you live?  Yes Need for family participation in patient care:  Yes (Comment)  Care giving concerns:  Patient resides long term at [facility].   Social Worker assessment / plan:  MD to discharge patient today. CSW spoke with [facility] at [facility] and patient can return today. Discharge information sent to [facility]. CSW spoke with patient, patient's wife and daughter in patient's room this morning. They asked appropriate questions and wish to transport him there.  Employment status:  Retired Database administratornsurance information:  Managed Medicare PT Recommendations:    Information / Referral to community resources:     Patient/Family's Response to care:  Patient's wife expressed appreciation for CSW assistance.  Patient/Family's Understanding of and Emotional Response to Diagnosis, Current Treatment, and Prognosis:  Patient's wife is involved in her husband's care.  Emotional Assessment Appearance:  Appears stated age Attitude/Demeanor/Rapport:    Affect (typically observed):  Calm Orientation:  Oriented to Self Alcohol / Substance use:  Not Applicable Psych involvement (Current and /or in the community):  No  (Comment)  Discharge Needs  Concerns to be addressed:  Care Coordination Readmission within the last 30 days:  No Current discharge risk:  None Barriers to Discharge:  No Barriers Identified   York SpanielMonica Lina Hitch, LCSW 07/30/2017, 10:29 AM

## 2017-08-06 LAB — AEROBIC/ANAEROBIC CULTURE W GRAM STAIN (SURGICAL/DEEP WOUND)

## 2017-08-06 LAB — AEROBIC/ANAEROBIC CULTURE (SURGICAL/DEEP WOUND): GRAM STAIN: NONE SEEN

## 2017-08-07 ENCOUNTER — Inpatient Hospital Stay: Payer: Self-pay | Admitting: Surgery

## 2017-08-08 ENCOUNTER — Ambulatory Visit: Payer: Medicare Other | Admitting: Surgery

## 2017-08-08 ENCOUNTER — Encounter: Payer: Self-pay | Admitting: Surgery

## 2017-08-08 VITALS — BP 138/70 | HR 67 | Temp 97.8°F | Ht 68.0 in

## 2017-08-08 DIAGNOSIS — L02212 Cutaneous abscess of back [any part, except buttock]: Secondary | ICD-10-CM

## 2017-08-08 NOTE — Progress Notes (Signed)
Outpatient Surgical Follow Up  08/08/2017  Perry [name] is an 83 y.o. male.   Chief Complaint  Patient presents with  . Hospitalization Follow-up    hospital follow up Abscess Cellulitis of back except buttock    HPI: A 83-year-old debilitated male status post I&D of a back abscess. He has been doing very well. No fevers no chills no evidence of an acute infection. He has been doing daily packing to the wound.  Past Medical History:  Diagnosis Date  . Abscess 07/28/2017  . Aortic insufficiency    a. 06/2017 Echo: Mild to mod AI.  Marland Kitchen. Arthritis   . Atypical Parkinsonism (HCC)    lower extremity   . Benign hypertensive heart disease without heart failure 12/23/2012  . CAD (coronary artery disease) 06/14/2017   Overview:  Cardiovascular MRI 05/05/2009 LVEF 60%, right ventricle normal size and function.  Left ventricle is mildly enlarged with near circumferential hypertrophy. Adenosine stress unremarkable.  No ischemia, or infarct noted, apical inferior inferolateral wall correspond to hypokinesis and mild cardial thinning consistent with myocardial infarction in the RCA distribution. Evaluated by Dr. Niel Hummerhri  . Diastolic dysfunction    a. 06/2017 Echo: EF 50-55%, basal to mid inf HK. Gr1 DD. mild to mod AI. Mild MR. sev dil LA. mild TR. PasP 45mmHg.  . Frequent falls   . Heart attack (HCC)   . History of bradycardia    a. beta blocker d/c'd 12/2016.  Marland Kitchen. Hyperlipidemia   . Hypertension   . Hyperthyroidism 05/09/2011   Overview:  Mildly hyperthyroidism, 6/03.   . Mitral regurgitation    a. 06/2017 Echo: Mild MR.  . New onset right bundle branch block (RBBB) 07/16/2017  . NPH (normal pressure hydrocephalus)   . Parkinson's disease (HCC)   . Right inguinal hernia 02/28/2011  . S/P hip replacement 05/09/2011   Overview:  March 2007.   . S/P TURP   . Thoracic aortic aneurysm without rupture (HCC) 05/09/2011   Overview:  4.8 cm by CT angiogram 11/18  . Thrombocytopenia, idiopathic (HCC)  10/06/2014  . Urinary incontinence   . Venous insufficiency of both lower extremities     Past Surgical History:  Procedure Laterality Date  . CHOLECYSTECTOMY    . HERNIA REPAIR    . HIP FUSION     lt  . KNEE SURGERY     rt knee  . PROSTATE SURGERY    . TONSILLECTOMY    . WRIST SURGERY      Family History  Problem Relation Age of Onset  . Healthy Mother   . Hypertension Father   . Ovarian cancer Sister   . Appendicitis Brother   . Hypertension Sister   . Heart disease Sister     Social History:  reports that he has quit smoking. His smoking use included cigars. he has never used smokeless tobacco. He reports that he does not drink alcohol. His drug history is not on file.  Allergies:  Allergies  Allergen Reactions  . Tetanus Toxoids Other (See Comments)    REACTION: " 50 CENT PIECE SIZE WELTS AND CLOSES MY EYES"  . Atorvastatin Other (See Comments)    REACTION: " MUSCLE WEAKNESS"  . Rosuvastatin Other (See Comments)    REACTION: " MUSCLE WEAKNESS"    Medications reviewed.    ROS Full ROS performed and is otherwise negative other than what is stated in HPI   BP 138/70   Pulse 67   Temp 97.8 F (36.6 C) (Oral)  Ht 5\' 8"  (1.727 m)   BMI 29.35 kg/m   Physical Exam  Constitutional: He is oriented to person, place, and time. No distress.  Neck: No JVD present.  Pulmonary/Chest: Effort normal. No stridor. No respiratory distress.  Abdominal: Soft. He exhibits no distension. There is no tenderness.  Neurological: He is alert and oriented to person, place, and time. GCS score is 15.  Skin: Skin is warm and dry. He is not diaphoretic.  3x 2 cms back cavity good granulation tissue, wound repacked. No infection  Psychiatric: Mood, memory, affect and judgment normal.  Nursing note and vitals reviewed.     Assessment/Plan: Continue daily packing to wound No surgical issues RTC prn  Sterling Bigiego Ignacio Lowder, MD [facility]

## 2017-08-08 NOTE — Patient Instructions (Signed)
Continue packing your wound and placing a new dressing daily.  We will not need to see you back in office.  However if you have any questions or concerns please give our office a call.

## 2017-08-26 ENCOUNTER — Ambulatory Visit: Payer: Medicare Other | Admitting: Nurse Practitioner

## 2017-12-22 ENCOUNTER — Encounter: Payer: Self-pay | Admitting: Neurology

## 2017-12-29 ENCOUNTER — Ambulatory Visit: Payer: Medicare Other | Admitting: Neurology

## 2018-03-12 DEATH — deceased

## 2018-09-18 IMAGING — MR MR HEAD W/O CM
9 of 10 series · 37 of 48 positions shown · non-contrast
Comparison: CT HEAD June 14, 2017 MRI of the head November 13, 2015

CLINICAL DATA: 10 minutes syncopal episode yesterday. History of
hypertension, hyperlipidemia, Parkinson's disease.

EXAM:
MRI HEAD WITHOUT CONTRAST
TECHNIQUE: Multiplanar, multiecho pulse sequences of the brain and surrounding
structures were obtained without intravenous contrast.

[Series 4: DWI · axial · 3.0mm · 1.09mm/px · z∈[-29,+115]mm · 11 of 108 slices shown (1 of 4)]
[im 1/108]
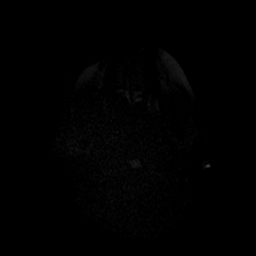
[im 11/108]
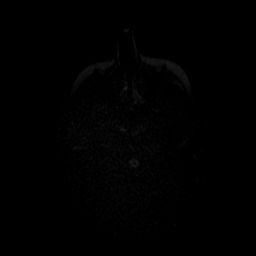
[im 22/108]
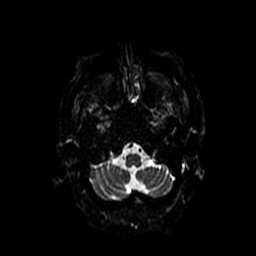
[im 33/108]
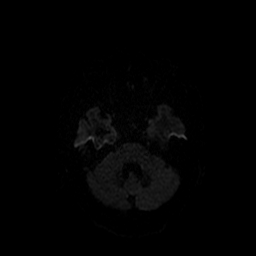
[im 43/108]
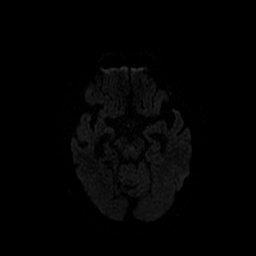
[im 54/108]
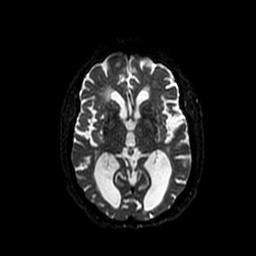
[im 65/108]
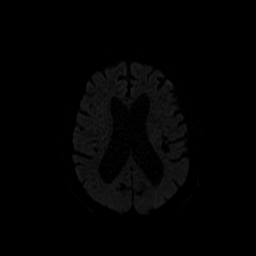
[im 75/108]
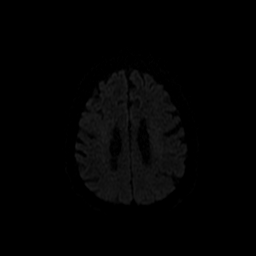
[im 86/108]
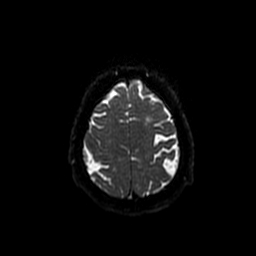
[im 97/108]
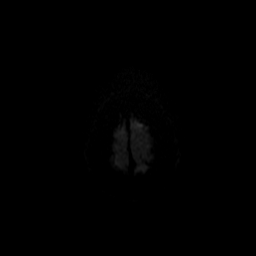
[im 108/108]
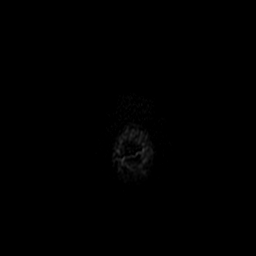

[Series 5: DWI · coronal · 5.0mm · 1.09mm/px · 7 of 80 slices shown (2 of 4)]
[im 1/80]
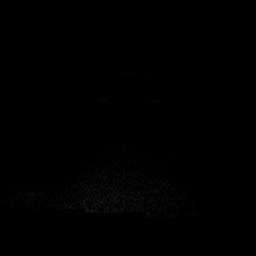
[im 14/80]
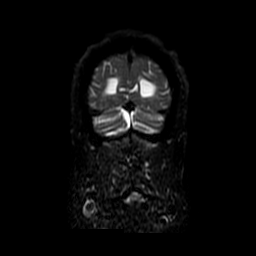
[im 27/80]
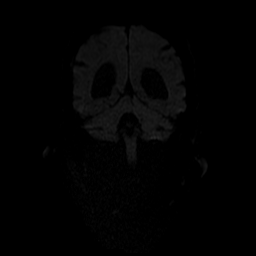
[im 40/80]
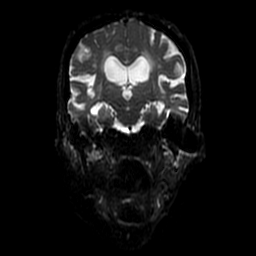
[im 53/80]
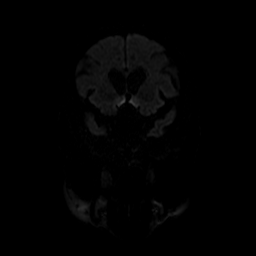
[im 66/80]
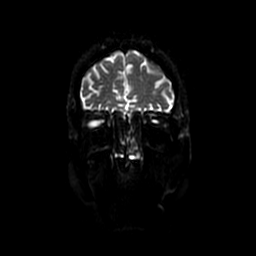
[im 80/80]
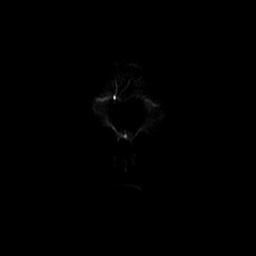

[Series 6: T1 · sagittal · 5.0mm · 0.51mm/px · 2 of 25 slices shown]
[im 1/25]
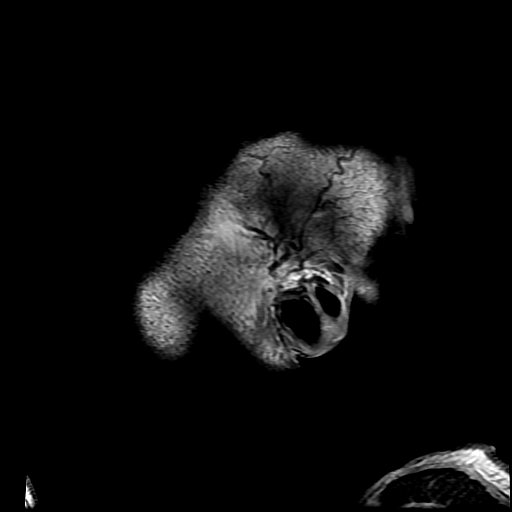
[im 25/25]
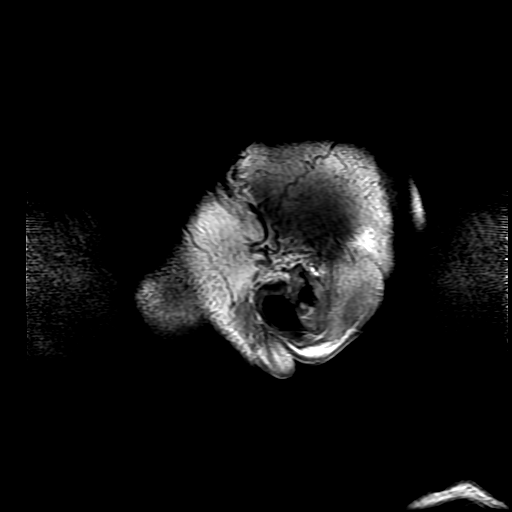

[Series 7: T2 · axial · 5.0mm · 0.47mm/px · z∈[-25,+108]mm · 2 of 27 slices shown (1 of 2)]
[im 1/27]
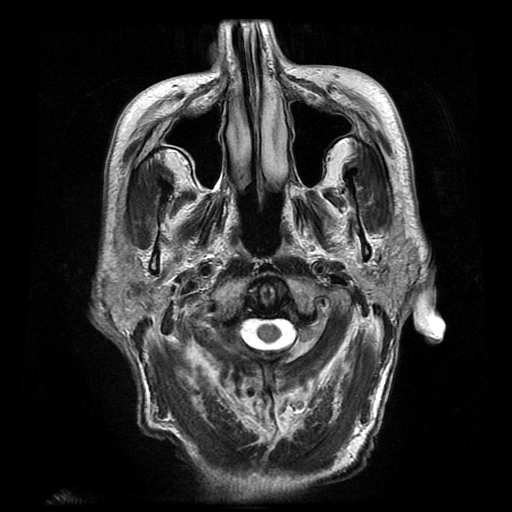
[im 27/27]
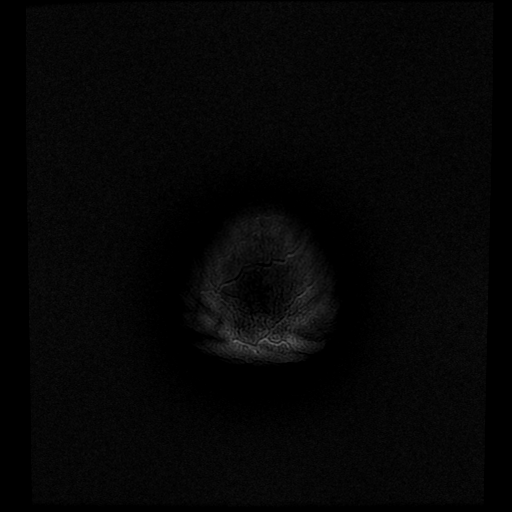

[Series 8: FLAIR · axial · 3.0mm · 0.47mm/px · z∈[-25,+108]mm · 2 of 27 slices shown]
[im 1/27]
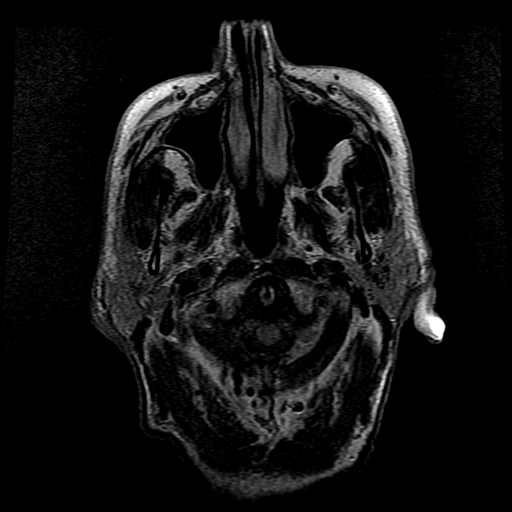
[im 27/27]
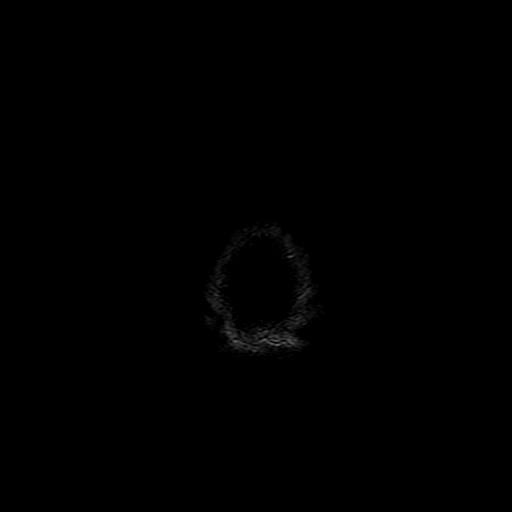

[Series 9: ax mpgr · axial · 5.0mm · 0.47mm/px · 1 of 23 slices shown]
[im 1/23]
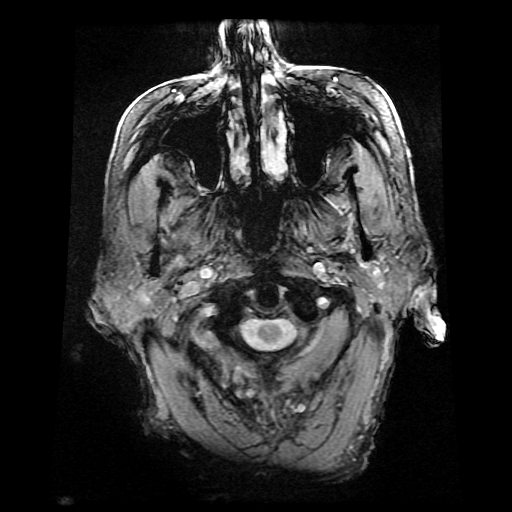

[Series 11: T2 · coronal · 5.0mm · 0.43mm/px · 3 of 30 slices shown (2 of 2)]
[im 1/30]
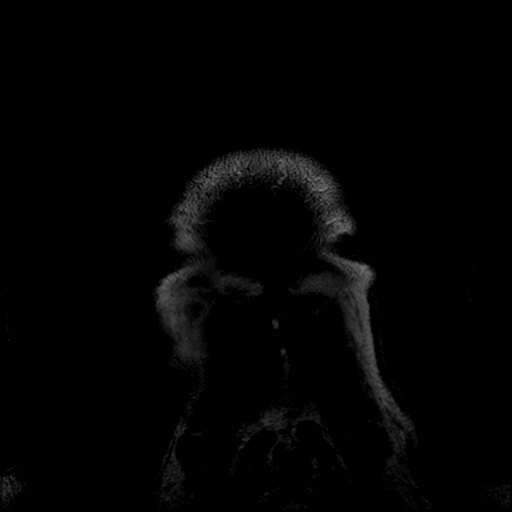
[im 15/30]
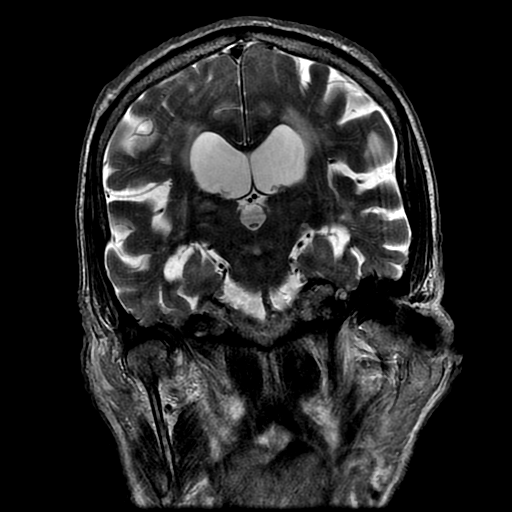
[im 30/30]
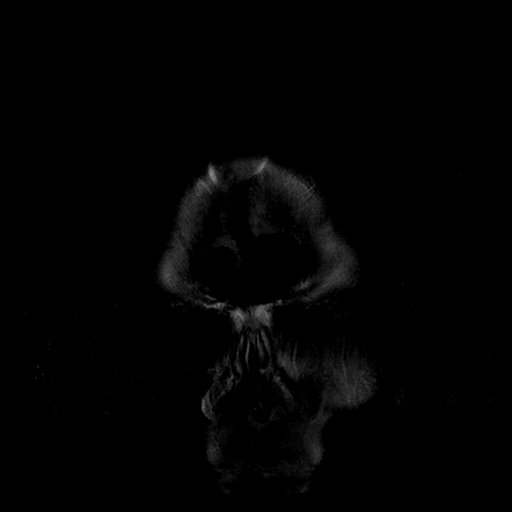

[Series 400: DWI · axial · 3.0mm · 1.09mm/px · z∈[-29,+115]mm · 5 of 54 slices shown (3 of 4)]
[im 1/54]
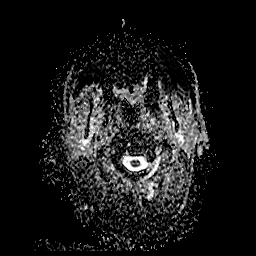
[im 14/54]
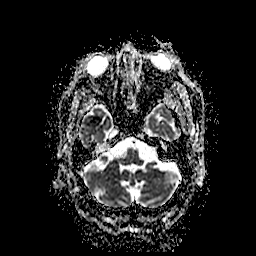
[im 27/54]
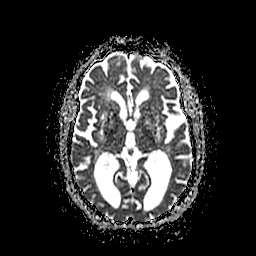
[im 40/54]
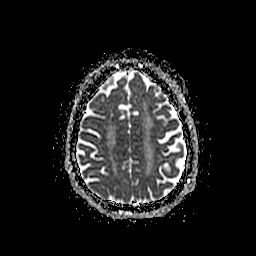
[im 54/54]
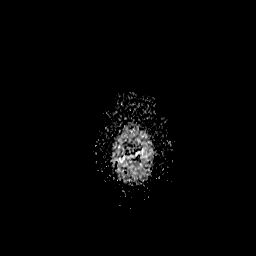

[Series 500: DWI · coronal · 5.0mm · 1.09mm/px · 4 of 40 slices shown (4 of 4)]
[im 1/40]
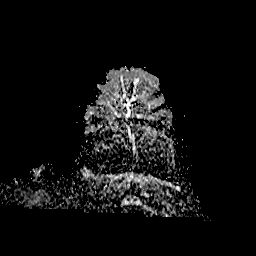
[im 14/40]
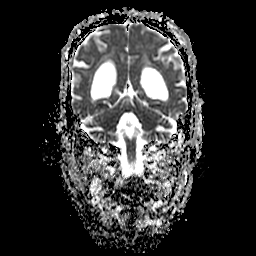
[im 27/40]
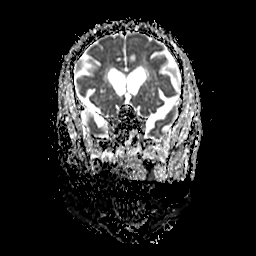
[im 40/40]
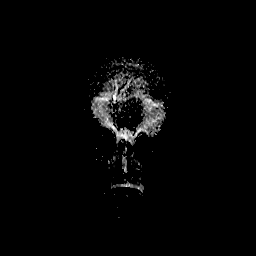

[37 of 48 positions shown; findings below may reference images not displayed]

FINDINGS: BRAIN: No reduced diffusion to suggest acute ischemia. Scattered
supra- and infratentorial micro hemorrhages in a nonspecific
distribution. Moderate to severe ventriculomegaly on the basis of
parenchymal brain volume loss though, there is disproportionate
sulcal effacement at the convexities with lobulated lateral
ventricle contour. Patchy to confluent supratentorial white matter
FLAIR T2 hyperintensities. Prominent bilateral basal ganglia and
thalamus perivascular spaces associated with chronic small vessel
ischemic disease. Old LEFT thalamus lacunar infarct. No midline
shift, mass effect or masses. No abnormal extra-axial fluid
collections.

VASCULAR: Normal major intracranial vascular flow voids present at
skull base.

SKULL AND UPPER CERVICAL SPINE: No abnormal sellar expansion. No
suspicious calvarial bone marrow signal. Craniocervical junction
maintained.

SINUSES/ORBITS: Trace paranasal sinus mucosal thickening without
air-fluid levels. Trace LEFT mastoid effusion. The included ocular
globes and orbital contents are non-suspicious. Status post
bilateral ocular lens implants.

OTHER: None.
IMPRESSION: 1. No acute intracranial process.
2. Stable moderate to severe atrophy with a component of suspected
normal pressure hydrocephalus.
3. Moderate chronic small vessel ischemic disease. Old lacunar
infarct.
